# Patient Record
Sex: Female | Born: 1935 | Race: White | Hispanic: No | Smoking: Current every day smoker
Health system: Southern US, Community
[De-identification: ages and names within clinical notes are randomized; demographics above are authoritative.]

## PROBLEM LIST (undated history)

## (undated) DIAGNOSIS — B029 Zoster without complications: Secondary | ICD-10-CM

## (undated) DIAGNOSIS — E039 Hypothyroidism, unspecified: Secondary | ICD-10-CM

## (undated) DIAGNOSIS — C801 Malignant (primary) neoplasm, unspecified: Secondary | ICD-10-CM

## (undated) DIAGNOSIS — F419 Anxiety disorder, unspecified: Secondary | ICD-10-CM

## (undated) DIAGNOSIS — F0390 Unspecified dementia without behavioral disturbance: Secondary | ICD-10-CM

## (undated) HISTORY — PX: APPENDECTOMY: SHX54

## (undated) HISTORY — PX: BREAST SURGERY: SHX581

## (undated) HISTORY — PX: EYE SURGERY: SHX253

---

## 2010-04-14 ENCOUNTER — Ambulatory Visit: Payer: Self-pay | Admitting: Pain Medicine

## 2010-05-08 ENCOUNTER — Ambulatory Visit: Payer: Self-pay | Admitting: Pain Medicine

## 2010-05-12 ENCOUNTER — Ambulatory Visit: Payer: Self-pay | Admitting: Pain Medicine

## 2010-06-12 ENCOUNTER — Ambulatory Visit: Payer: Self-pay | Admitting: Pain Medicine

## 2010-07-09 ENCOUNTER — Ambulatory Visit: Payer: Self-pay | Admitting: Pain Medicine

## 2010-08-06 ENCOUNTER — Ambulatory Visit: Payer: Self-pay | Admitting: Pain Medicine

## 2010-09-03 ENCOUNTER — Ambulatory Visit: Payer: Self-pay | Admitting: Pain Medicine

## 2010-10-08 ENCOUNTER — Ambulatory Visit: Payer: Self-pay | Admitting: Pain Medicine

## 2010-11-12 ENCOUNTER — Ambulatory Visit: Payer: Self-pay | Admitting: Pain Medicine

## 2010-12-10 ENCOUNTER — Ambulatory Visit: Payer: Self-pay | Admitting: Pain Medicine

## 2011-01-07 ENCOUNTER — Ambulatory Visit: Payer: Self-pay | Admitting: Pain Medicine

## 2011-02-04 ENCOUNTER — Ambulatory Visit: Payer: Self-pay | Admitting: Pain Medicine

## 2011-03-04 ENCOUNTER — Ambulatory Visit: Payer: Self-pay | Admitting: Pain Medicine

## 2011-04-08 ENCOUNTER — Ambulatory Visit: Payer: Self-pay | Admitting: Pain Medicine

## 2011-04-15 ENCOUNTER — Ambulatory Visit: Payer: Self-pay | Admitting: Pain Medicine

## 2011-05-06 ENCOUNTER — Ambulatory Visit: Payer: Self-pay | Admitting: Pain Medicine

## 2011-05-13 ENCOUNTER — Ambulatory Visit: Payer: Self-pay | Admitting: Pain Medicine

## 2011-06-03 ENCOUNTER — Ambulatory Visit: Payer: Self-pay | Admitting: Pain Medicine

## 2011-07-08 ENCOUNTER — Ambulatory Visit: Payer: Self-pay | Admitting: Pain Medicine

## 2013-12-12 DIAGNOSIS — F329 Major depressive disorder, single episode, unspecified: Secondary | ICD-10-CM | POA: Insufficient documentation

## 2013-12-12 DIAGNOSIS — J309 Allergic rhinitis, unspecified: Secondary | ICD-10-CM | POA: Insufficient documentation

## 2013-12-12 DIAGNOSIS — F3289 Other specified depressive episodes: Secondary | ICD-10-CM | POA: Insufficient documentation

## 2013-12-12 DIAGNOSIS — E039 Hypothyroidism, unspecified: Secondary | ICD-10-CM | POA: Insufficient documentation

## 2013-12-12 DIAGNOSIS — B0229 Other postherpetic nervous system involvement: Secondary | ICD-10-CM | POA: Insufficient documentation

## 2014-03-21 ENCOUNTER — Ambulatory Visit: Payer: Self-pay | Admitting: Orthopedic Surgery

## 2015-01-03 DIAGNOSIS — H3530 Unspecified macular degeneration: Secondary | ICD-10-CM | POA: Insufficient documentation

## 2015-05-22 ENCOUNTER — Other Ambulatory Visit: Payer: Self-pay | Admitting: Orthopedic Surgery

## 2015-05-22 DIAGNOSIS — S32040A Wedge compression fracture of fourth lumbar vertebra, initial encounter for closed fracture: Secondary | ICD-10-CM

## 2015-05-27 ENCOUNTER — Ambulatory Visit
Admission: RE | Admit: 2015-05-27 | Discharge: 2015-05-27 | Disposition: A | Discharge status: Home / Self Care | Payer: Medicare Other | Source: Ambulatory Visit | Attending: Orthopedic Surgery | Admitting: Orthopedic Surgery

## 2015-05-27 DIAGNOSIS — X58XXXA Exposure to other specified factors, initial encounter: Secondary | ICD-10-CM | POA: Diagnosis not present

## 2015-05-27 DIAGNOSIS — S32040A Wedge compression fracture of fourth lumbar vertebra, initial encounter for closed fracture: Secondary | ICD-10-CM

## 2015-05-27 DIAGNOSIS — M5126 Other intervertebral disc displacement, lumbar region: Secondary | ICD-10-CM | POA: Diagnosis not present

## 2015-05-27 DIAGNOSIS — S32049A Unspecified fracture of fourth lumbar vertebra, initial encounter for closed fracture: Secondary | ICD-10-CM | POA: Insufficient documentation

## 2015-05-27 DIAGNOSIS — M5146 Schmorl's nodes, lumbar region: Secondary | ICD-10-CM | POA: Insufficient documentation

## 2015-06-20 ENCOUNTER — Inpatient Hospital Stay: Admission: RE | Admit: 2015-06-20 | Payer: Medicare Other | Source: Ambulatory Visit

## 2015-06-25 ENCOUNTER — Encounter
Admission: RE | Admit: 2015-06-25 | Discharge: 2015-06-25 | Disposition: A | Discharge status: Home / Self Care | Payer: Medicare Other | Source: Ambulatory Visit | Attending: Orthopedic Surgery | Admitting: Orthopedic Surgery

## 2015-06-25 DIAGNOSIS — Z803 Family history of malignant neoplasm of breast: Secondary | ICD-10-CM | POA: Diagnosis not present

## 2015-06-25 DIAGNOSIS — Z79899 Other long term (current) drug therapy: Secondary | ICD-10-CM | POA: Diagnosis not present

## 2015-06-25 DIAGNOSIS — S32000A Wedge compression fracture of unspecified lumbar vertebra, initial encounter for closed fracture: Secondary | ICD-10-CM | POA: Diagnosis present

## 2015-06-25 DIAGNOSIS — K5792 Diverticulitis of intestine, part unspecified, without perforation or abscess without bleeding: Secondary | ICD-10-CM | POA: Diagnosis not present

## 2015-06-25 DIAGNOSIS — E039 Hypothyroidism, unspecified: Secondary | ICD-10-CM | POA: Diagnosis not present

## 2015-06-25 DIAGNOSIS — F0390 Unspecified dementia without behavioral disturbance: Secondary | ICD-10-CM | POA: Diagnosis not present

## 2015-06-25 DIAGNOSIS — E538 Deficiency of other specified B group vitamins: Secondary | ICD-10-CM | POA: Diagnosis not present

## 2015-06-25 DIAGNOSIS — Y929 Unspecified place or not applicable: Secondary | ICD-10-CM | POA: Diagnosis not present

## 2015-06-25 DIAGNOSIS — F17200 Nicotine dependence, unspecified, uncomplicated: Secondary | ICD-10-CM | POA: Diagnosis not present

## 2015-06-25 DIAGNOSIS — Z7982 Long term (current) use of aspirin: Secondary | ICD-10-CM | POA: Diagnosis not present

## 2015-06-25 DIAGNOSIS — Z86010 Personal history of colonic polyps: Secondary | ICD-10-CM | POA: Diagnosis not present

## 2015-06-25 DIAGNOSIS — Z853 Personal history of malignant neoplasm of breast: Secondary | ICD-10-CM | POA: Diagnosis not present

## 2015-06-25 DIAGNOSIS — Z8669 Personal history of other diseases of the nervous system and sense organs: Secondary | ICD-10-CM | POA: Diagnosis not present

## 2015-06-25 DIAGNOSIS — S32049A Unspecified fracture of fourth lumbar vertebra, initial encounter for closed fracture: Secondary | ICD-10-CM | POA: Diagnosis not present

## 2015-06-25 DIAGNOSIS — E785 Hyperlipidemia, unspecified: Secondary | ICD-10-CM | POA: Diagnosis not present

## 2015-06-25 DIAGNOSIS — F329 Major depressive disorder, single episode, unspecified: Secondary | ICD-10-CM | POA: Diagnosis not present

## 2015-06-25 DIAGNOSIS — L409 Psoriasis, unspecified: Secondary | ICD-10-CM | POA: Diagnosis not present

## 2015-06-25 DIAGNOSIS — Z8249 Family history of ischemic heart disease and other diseases of the circulatory system: Secondary | ICD-10-CM | POA: Diagnosis not present

## 2015-06-25 DIAGNOSIS — X58XXXA Exposure to other specified factors, initial encounter: Secondary | ICD-10-CM | POA: Diagnosis not present

## 2015-06-25 DIAGNOSIS — Y939 Activity, unspecified: Secondary | ICD-10-CM | POA: Diagnosis not present

## 2015-06-25 HISTORY — DX: Anxiety disorder, unspecified: F41.9

## 2015-06-25 HISTORY — DX: Hypothyroidism, unspecified: E03.9

## 2015-06-25 HISTORY — DX: Zoster without complications: B02.9

## 2015-06-25 HISTORY — DX: Unspecified dementia, unspecified severity, without behavioral disturbance, psychotic disturbance, mood disturbance, and anxiety: F03.90

## 2015-06-25 HISTORY — DX: Malignant (primary) neoplasm, unspecified: C80.1

## 2015-06-25 LAB — BASIC METABOLIC PANEL WITH GFR
Anion gap: 4 — ABNORMAL LOW (ref 5–15)
BUN: 10 mg/dL (ref 6–20)
CO2: 28 mmol/L (ref 22–32)
Calcium: 9.2 mg/dL (ref 8.9–10.3)
Chloride: 106 mmol/L (ref 101–111)
Creatinine, Ser: 0.76 mg/dL (ref 0.44–1.00)
GFR calc Af Amer: >60 mL/min (ref >60)
GFR calc non Af Amer: >60 mL/min (ref >60)
Glucose, Bld: 86 mg/dL (ref 65–99)
Potassium: 4.5 mmol/L (ref 3.5–5.1)
Sodium: 138 mmol/L (ref 135–145)

## 2015-06-25 LAB — CBC
HCT: 39 % (ref 35.0–47.0)
Hemoglobin: 12.9 g/dL (ref 12.0–16.0)
MCH: 29.8 pg (ref 26.0–34.0)
MCHC: 33.1 g/dL (ref 32.0–36.0)
MCV: 90 fL (ref 80.0–100.0)
Platelets: 133 K/uL — ABNORMAL LOW (ref 150–440)
RBC: 4.33 MIL/uL (ref 3.80–5.20)
RDW: 16.3 % — ABNORMAL HIGH (ref 11.5–14.5)
WBC: 7.7 K/uL (ref 3.6–11.0)

## 2015-06-25 LAB — SURGICAL PCR SCREEN
MRSA, PCR: NEGATIVE
Staphylococcus aureus: NEGATIVE

## 2015-06-25 NOTE — Patient Instructions (Signed)
  Your procedure is scheduled on: 06/27/15 Thurs Report to Day Surgery.2nd floor medical mall To find out your arrival time please call [phone] between 1PM - 3PM on 06/26/15 Wed.  Remember: Instructions that are not followed completely may result in serious medical risk, up to and including death, or upon the discretion of your surgeon and anesthesiologist your surgery may need to be rescheduled.    _x___ 1. Do not eat food or drink liquids after midnight. No gum chewing or hard candies.     ____ 2. No Alcohol for 24 hours before or after surgery.   ____ 3. Bring all medications with you on the day of surgery if instructed.    __x__ 4. Notify your doctor if there is any change in your medical condition     (cold, fever, infections).     Do not wear jewelry, make-up, hairpins, clips or nail polish.  Do not wear lotions, powders, or perfumes. You may wear deodorant.  Do not shave 48 hours prior to surgery. Men may shave face and neck.  Do not bring valuables to the hospital.    [facility] is not responsible for any belongings or valuables.               Contacts, dentures or bridgework may not be worn into surgery.  Leave your suitcase in the car. After surgery it may be brought to your room.  For patients admitted to the hospital, discharge time is determined by your                treatment team.   Patients discharged the day of surgery will not be allowed to drive home.   Please read over the following fact sheets that you were given:   MRSA Information   ____ Take these medicines the morning of surgery with A SIP OF WATER:    1. celexa  2. aricept   3. neurontin  4. synthroid  5.  6.  ____ Fleet Enema (as directed)   ____ Use CHG Soap as directed  ____ Use inhalers on the day of surgery  ____ Stop metformin 2 days prior to surgery    ____ Take 1/2 of usual insulin dose the night before surgery and none on the morning of surgery.   __x__ Stop  Coumadin/Plavix/aspirin stop aspirin today  _x___ Stop Anti-inflammatories stop mobic and ibuprofen today   ____ Stop supplements until after surgery.    ____ Bring C-Pap to the hospital.

## 2015-06-27 ENCOUNTER — Encounter: Payer: Self-pay | Admitting: *Deleted

## 2015-06-27 ENCOUNTER — Encounter
Admission: RE | Disposition: A | Discharge status: Home / Self Care | Payer: Self-pay | Source: Ambulatory Visit | Attending: Orthopedic Surgery

## 2015-06-27 ENCOUNTER — Ambulatory Visit: Payer: Medicare Other | Admitting: Anesthesiology

## 2015-06-27 ENCOUNTER — Ambulatory Visit: Payer: Medicare Other

## 2015-06-27 ENCOUNTER — Ambulatory Visit
Admission: RE | Admit: 2015-06-27 | Discharge: 2015-06-27 | Disposition: A | Discharge status: Home / Self Care | Payer: Medicare Other | Source: Ambulatory Visit | Attending: Orthopedic Surgery | Admitting: Orthopedic Surgery

## 2015-06-27 DIAGNOSIS — K5792 Diverticulitis of intestine, part unspecified, without perforation or abscess without bleeding: Secondary | ICD-10-CM | POA: Insufficient documentation

## 2015-06-27 DIAGNOSIS — Z79899 Other long term (current) drug therapy: Secondary | ICD-10-CM | POA: Insufficient documentation

## 2015-06-27 DIAGNOSIS — Z8249 Family history of ischemic heart disease and other diseases of the circulatory system: Secondary | ICD-10-CM | POA: Insufficient documentation

## 2015-06-27 DIAGNOSIS — L409 Psoriasis, unspecified: Secondary | ICD-10-CM | POA: Insufficient documentation

## 2015-06-27 DIAGNOSIS — E785 Hyperlipidemia, unspecified: Secondary | ICD-10-CM | POA: Insufficient documentation

## 2015-06-27 DIAGNOSIS — T148XXA Other injury of unspecified body region, initial encounter: Secondary | ICD-10-CM

## 2015-06-27 DIAGNOSIS — F329 Major depressive disorder, single episode, unspecified: Secondary | ICD-10-CM | POA: Insufficient documentation

## 2015-06-27 DIAGNOSIS — Z8669 Personal history of other diseases of the nervous system and sense organs: Secondary | ICD-10-CM | POA: Insufficient documentation

## 2015-06-27 DIAGNOSIS — E538 Deficiency of other specified B group vitamins: Secondary | ICD-10-CM | POA: Insufficient documentation

## 2015-06-27 DIAGNOSIS — Z803 Family history of malignant neoplasm of breast: Secondary | ICD-10-CM | POA: Insufficient documentation

## 2015-06-27 DIAGNOSIS — E039 Hypothyroidism, unspecified: Secondary | ICD-10-CM | POA: Insufficient documentation

## 2015-06-27 DIAGNOSIS — Z7982 Long term (current) use of aspirin: Secondary | ICD-10-CM | POA: Insufficient documentation

## 2015-06-27 DIAGNOSIS — Y939 Activity, unspecified: Secondary | ICD-10-CM | POA: Insufficient documentation

## 2015-06-27 DIAGNOSIS — F17200 Nicotine dependence, unspecified, uncomplicated: Secondary | ICD-10-CM | POA: Insufficient documentation

## 2015-06-27 DIAGNOSIS — S32049A Unspecified fracture of fourth lumbar vertebra, initial encounter for closed fracture: Secondary | ICD-10-CM | POA: Insufficient documentation

## 2015-06-27 DIAGNOSIS — F0390 Unspecified dementia without behavioral disturbance: Secondary | ICD-10-CM | POA: Insufficient documentation

## 2015-06-27 DIAGNOSIS — Z853 Personal history of malignant neoplasm of breast: Secondary | ICD-10-CM | POA: Insufficient documentation

## 2015-06-27 DIAGNOSIS — X58XXXA Exposure to other specified factors, initial encounter: Secondary | ICD-10-CM | POA: Insufficient documentation

## 2015-06-27 DIAGNOSIS — Y929 Unspecified place or not applicable: Secondary | ICD-10-CM | POA: Insufficient documentation

## 2015-06-27 DIAGNOSIS — Z86010 Personal history of colonic polyps: Secondary | ICD-10-CM | POA: Insufficient documentation

## 2015-06-27 HISTORY — PX: KYPHOPLASTY: SHX5884

## 2015-06-27 SURGERY — KYPHOPLASTY
Anesthesia: General | Wound class: Clean

## 2015-06-27 MED ORDER — BUPIVACAINE-EPINEPHRINE (PF) 0.5% -1:200000 IJ SOLN
INTRAMUSCULAR | Status: AC
  Filled 2015-06-27: qty 30

## 2015-06-27 MED ORDER — HYDROCODONE-ACETAMINOPHEN 5-325 MG PO TABS: 1.0000 | ORAL_TABLET | Freq: Four times a day (QID) | ORAL | Status: DC | PRN

## 2015-06-27 MED ORDER — FAMOTIDINE 20 MG PO TABS
20.0000 mg | ORAL_TABLET | Freq: Once | ORAL | Status: AC
  Administered 2015-06-27: 20 mg via ORAL

## 2015-06-27 MED ORDER — IOHEXOL 240 MG/ML SOLN
INTRAMUSCULAR | Status: AC
  Filled 2015-06-27: qty 100

## 2015-06-27 MED ORDER — IOHEXOL 240 MG/ML SOLN
INTRAMUSCULAR | Status: AC
  Filled 2015-06-27: qty 200

## 2015-06-27 MED ORDER — FAMOTIDINE 20 MG PO TABS
ORAL_TABLET | ORAL | Status: AC
  Administered 2015-06-27: 20 mg via ORAL
  Filled 2015-06-27: qty 1

## 2015-06-27 MED ORDER — MIDAZOLAM HCL 2 MG/2ML IJ SOLN
INTRAMUSCULAR | Status: DC | PRN
  Administered 2015-06-27 (×2): 1 mg via INTRAVENOUS

## 2015-06-27 MED ORDER — BUPIVACAINE-EPINEPHRINE (PF) 0.5% -1:200000 IJ SOLN
INTRAMUSCULAR | Status: DC | PRN
  Administered 2015-06-27: 10 mL via PERINEURAL

## 2015-06-27 MED ORDER — KETAMINE HCL 10 MG/ML IJ SOLN
INTRAMUSCULAR | Status: DC | PRN
  Administered 2015-06-27: 10 mg via INTRAVENOUS
  Administered 2015-06-27: 15 mg via INTRAVENOUS
  Administered 2015-06-27: 25 mg via INTRAVENOUS

## 2015-06-27 MED ORDER — LACTATED RINGERS IV SOLN
INTRAVENOUS | Status: DC
  Administered 2015-06-27 (×2): via INTRAVENOUS

## 2015-06-27 MED ORDER — HYDROCODONE-ACETAMINOPHEN 5-325 MG PO TABS: 1.0000 | ORAL_TABLET | ORAL | Status: DC | PRN

## 2015-06-27 MED ORDER — LIDOCAINE HCL 1 % IJ SOLN
INTRAMUSCULAR | Status: DC | PRN
  Administered 2015-06-27: 10 mL

## 2015-06-27 MED ORDER — METOCLOPRAMIDE HCL 5 MG/ML IJ SOLN: 5.0000 mg | Freq: Three times a day (TID) | INTRAMUSCULAR | Status: DC | PRN

## 2015-06-27 MED ORDER — LIDOCAINE HCL (PF) 1 % IJ SOLN
INTRAMUSCULAR | Status: AC
  Filled 2015-06-27: qty 30

## 2015-06-27 MED ORDER — ONDANSETRON HCL 4 MG PO TABS: 4.0000 mg | ORAL_TABLET | Freq: Four times a day (QID) | ORAL | Status: DC | PRN

## 2015-06-27 MED ORDER — CEFAZOLIN SODIUM-DEXTROSE 2-3 GM-% IV SOLR: 2.0000 g | Freq: Once | INTRAVENOUS | Status: DC

## 2015-06-27 MED ORDER — METOCLOPRAMIDE HCL 10 MG PO TABS: 5.0000 mg | ORAL_TABLET | Freq: Three times a day (TID) | ORAL | Status: DC | PRN

## 2015-06-27 MED ORDER — SODIUM CHLORIDE 0.9 % IV SOLN: INTRAVENOUS | Status: DC

## 2015-06-27 MED ORDER — PROPOFOL 500 MG/50ML IV EMUL
INTRAVENOUS | Status: DC | PRN
  Administered 2015-06-27: 25 ug/kg/min via INTRAVENOUS

## 2015-06-27 MED ORDER — CEFAZOLIN SODIUM-DEXTROSE 2-3 GM-% IV SOLR
INTRAVENOUS | Status: AC
  Filled 2015-06-27: qty 50

## 2015-06-27 MED ORDER — ONDANSETRON HCL 4 MG/2ML IJ SOLN: 4.0000 mg | Freq: Four times a day (QID) | INTRAMUSCULAR | Status: DC | PRN

## 2015-06-27 SURGICAL SUPPLY — 13 items
CEMENT KYPHON CX01A KIT/MIXER (Cement) ×3 IMPLANT
DEVICE BIOPSY BONE KYPHX (INSTRUMENTS) ×3 IMPLANT
DRAPE C-ARM XRAY 36X54 (DRAPES) ×3 IMPLANT
DURAPREP 26ML APPLICATOR (WOUND CARE) ×3 IMPLANT
GLOVE SURG ORTHO 9.0 STRL STRW (GLOVE) ×3 IMPLANT
GOWN SPECIALTY ULTRA XL (MISCELLANEOUS) ×3 IMPLANT
GOWN STRL REUS W/ TWL LRG LVL3 (GOWN DISPOSABLE) ×1 IMPLANT
GOWN STRL REUS W/TWL LRG LVL3 (GOWN DISPOSABLE) ×2
LIQUID BAND (GAUZE/BANDAGES/DRESSINGS) ×3 IMPLANT
PACK KYPHOPLASTY (MISCELLANEOUS) ×3 IMPLANT
STRAP SAFETY BODY (MISCELLANEOUS) ×3 IMPLANT
TRAY KYPHOPAK 15/3 EXPRESS 1ST (MISCELLANEOUS) ×3 IMPLANT
TRAY KYPHOPAK 20/3 EXPRESS 1ST (MISCELLANEOUS) ×3 IMPLANT

## 2015-06-27 NOTE — Discharge Instructions (Addendum)
Remove band aid Saturday Ok to shower, starting SaturdayAMBULATORY SURGERY  DISCHARGE INSTRUCTIONS   1) The drugs that you were given will stay in your system until tomorrow so for the next 24 hours you should not:  A) Drive an automobile B) Make any legal decisions C) Drink any alcoholic beverage   2) You may resume regular meals tomorrow.  Today it is better to start with liquids and gradually work up to solid foods.  You may eat anything you prefer, but it is better to start with liquids, then soup and crackers, and gradually work up to solid foods.   3) Please notify your doctor immediately if you have any unusual bleeding, trouble breathing, redness and pain at the surgery site, drainage, fever, or pain not relieved by medication.    4) Additional Instructions:        Please contact your physician with any problems or Same Day Surgery at [phone], Monday through Friday 6 am to 4 pm, or [place] at [facility] number at [phone].AMBULATORY SURGERY  DISCHARGE INSTRUCTIONS   5) The drugs that you were given will stay in your system until tomorrow so for the next 24 hours you should not:  D) Drive an automobile E) Make any legal decisions F) Drink any alcoholic beverage   6) You may resume regular meals tomorrow.  Today it is better to start with liquids and gradually work up to solid foods.  You may eat anything you prefer, but it is better to start with liquids, then soup and crackers, and gradually work up to solid foods.   7) Please notify your doctor immediately if you have any unusual bleeding, trouble breathing, redness and pain at the surgery site, drainage, fever, or pain not relieved by medication.    8) Additional Instructions:        Please contact your physician with any problems or Same Day Surgery at [phone], Monday through Friday 6 am to 4 pm, or [place] at [facility] number at [phone].

## 2015-06-27 NOTE — Anesthesia Preprocedure Evaluation (Addendum)
Anesthesia Evaluation  Patient identified by MRN, date of birth, ID band Patient awake    Reviewed: Allergy & Precautions, H&P , NPO status , Patient's Chart, lab work & pertinent test results, reviewed documented beta blocker date and time   History of Anesthesia Complications Negative for: history of anesthetic complications  Airway Mallampati: II  TM Distance: >3 FB Neck ROM: full    Dental no notable dental hx. (+) Upper Dentures, Edentulous Upper, Partial Lower, Poor Dentition   Pulmonary neg pulmonary ROS, Current Smoker,    Pulmonary exam normal breath sounds clear to auscultation       Cardiovascular Exercise Tolerance: Good negative cardio ROS Normal cardiovascular exam Rhythm:regular Rate:Normal     Neuro/Psych PSYCHIATRIC DISORDERS (Dementia and anxiety) negative neurological ROS  negative psych ROS   GI/Hepatic negative GI ROS, Neg liver ROS,   Endo/Other  neg diabetesHypothyroidism   Renal/GU negative Renal ROS  negative genitourinary   Musculoskeletal   Abdominal   Peds  Hematology negative hematology ROS (+)   Anesthesia Other Findings Past Medical History:   Shingles                                                     Cancer (HCC)                                                   Comment:breast   Hypothyroidism                                               Anxiety                                                      Dementia                                                     Reproductive/Obstetrics negative OB ROS                            Anesthesia Physical Anesthesia Plan  ASA: II  Anesthesia Plan: General   Post-op Pain Management:    Induction:   Airway Management Planned:   Additional Equipment:   Intra-op Plan:   Post-operative Plan:   Informed Consent: I have reviewed the patients History and Physical, chart, labs and discussed the procedure  including the risks, benefits and alternatives for the proposed anesthesia with the patient or authorized representative who has indicated his/her understanding and acceptance.   Dental Advisory Given  Plan Discussed with: Anesthesiologist, CRNA and Surgeon  Anesthesia Plan Comments:        Anesthesia Quick Evaluation

## 2015-06-27 NOTE — Anesthesia Procedure Notes (Signed)
Date/Time: 06/27/2015 3:07 PM Performed by: Nelda Marseille Pre-anesthesia Checklist: Patient identified, Emergency Drugs available, Suction available, Patient being monitored and Timeout performed Oxygen Delivery Method: Nasal cannula

## 2015-06-27 NOTE — Transfer of Care (Signed)
Immediate Anesthesia Transfer of Care Note  Patient: Tina [name]  Procedure(s) Performed: Procedure(s): KYPHOPLASTY L4 (N/A)  Patient Location: PACU  Anesthesia Type:General  Level of Consciousness: sedated  Airway & Oxygen Therapy: Patient Spontanous Breathing and Patient connected to nasal cannula oxygen  Post-op Assessment: Report given to RN and Post -op Vital signs reviewed and stable  Post vital signs: Reviewed and stable  Last Vitals:  Filed Vitals:   06/27/15 1204  BP: 130/70  Pulse: 76  Temp: 36.6 C  Resp: 20    Complications: No apparent anesthesia complications

## 2015-06-27 NOTE — H&P (Signed)
Reviewed paper H+P, will be scanned into chart. No changes noted.  

## 2015-06-27 NOTE — Op Note (Signed)
06/27/2015  3:31 PM  PATIENT:  Tina Tina [name]  81 y.o. female  PRE-OPERATIVE DIAGNOSIS:  L4 LUMBAR COMPRESSION FRACTURE  POST-OPERATIVE DIAGNOSIS:  L4 LUMBAR COMPRESSION FRACTURE  PROCEDURE:  Procedure(s): KYPHOPLASTY L4 (N/A)  SURGEON: Laurene Footman, MD  ASSISTANTS: None  ANESTHESIA:   local and MAC  EBL:  Total I/O In: 600 [I.V.:600] Out: 10 [Blood:10]  BLOOD ADMINISTERED:none  DRAINS: none   LOCAL MEDICATIONS USED:  MARCAINE    and XYLOCAINE   SPECIMEN:  Source of Specimen:  L4 vertebral body  DISPOSITION OF SPECIMEN:  PATHOLOGY  COUNTS:  YES  TOURNIQUET:  * No tourniquets in log *  IMPLANTS: Bone cement  DICTATION: .Dragon Dictation patient brought the operating room and after adequate sedation was given, the patient was placed prone. C-arm was brought in and good visualization of the L4 compression fractures obtained. After patient identification and timeout procedures were completed, 1% Xylocaine was infiltrated on the right and left side at L4. After infiltrating the subcutaneous skin, the back was then prepped and draped in sterile fashion and repeat timeout procedure carried out. Spinal needle was used to get down to the pedicle on both sides and a combination of 10 cc 1% Xylocaine 10 cc half percent Sensorcaine with epinephrine was infiltrated. Small incision was made on the right and a trocar advanced to a extrapedicular approach into the vertebral body. Biopsy was obtained. Drilling was carried out and a balloon inflated as went to the midline and so single stick was decided to be adequate. After inflating proximally 3 have cc the balloon was deflated and when the cement was the appropriate consistency, cement was used to Tina [name] the vertebral body. There was minimal extravasation into the disc space between L3 and 4 but good Tina [name] across the vertebral body in the area of the fracture. After adequate cement Tina [name] on both sides between the 2 pedicles trocar was removed  and permanent C-arm views obtained. Dermabond was used to close the skin incision, when it was dry Band-Aids applied  PLAN OF CARE: Discharge to home after PACU  PATIENT DISPOSITION:  PACU - hemodynamically stable.

## 2015-06-28 ENCOUNTER — Encounter: Payer: Self-pay | Admitting: Orthopedic Surgery

## 2015-06-29 NOTE — Anesthesia Postprocedure Evaluation (Signed)
Anesthesia Post Note  Patient: Tina [name]  Procedure(s) Performed: Procedure(s) (LRB): KYPHOPLASTY L4 (N/A)  Patient location during evaluation: PACU Anesthesia Type: General Level of consciousness: awake and alert Pain management: pain level controlled Vital Signs Assessment: post-procedure vital signs reviewed and stable Respiratory status: spontaneous breathing, nonlabored ventilation, respiratory function stable and patient connected to nasal cannula oxygen Cardiovascular status: blood pressure returned to baseline and stable Postop Assessment: no signs of nausea or vomiting Anesthetic complications: no    Last Vitals:  Filed Vitals:   06/27/15 1600 06/27/15 1615  BP: 150/74 151/74  Pulse: 84 84  Temp:  36.2 C  Resp: 18 18    Last Pain:  Filed Vitals:   06/28/15 0833  PainSc: 0-No pain                 Martha Clan

## 2015-07-02 LAB — SURGICAL PATHOLOGY

## 2016-01-07 ENCOUNTER — Other Ambulatory Visit: Payer: Self-pay | Admitting: Orthopedic Surgery

## 2016-01-07 DIAGNOSIS — M1611 Unilateral primary osteoarthritis, right hip: Secondary | ICD-10-CM

## 2016-01-08 ENCOUNTER — Other Ambulatory Visit: Payer: Self-pay | Admitting: Orthopedic Surgery

## 2016-01-09 ENCOUNTER — Ambulatory Visit
Admission: RE | Admit: 2016-01-09 | Discharge: 2016-01-09 | Disposition: A | Discharge status: Home / Self Care | Payer: Medicare Other | Source: Ambulatory Visit | Attending: Orthopedic Surgery | Admitting: Orthopedic Surgery

## 2016-01-09 DIAGNOSIS — M1611 Unilateral primary osteoarthritis, right hip: Secondary | ICD-10-CM | POA: Diagnosis not present

## 2016-01-09 MED ORDER — METHYLPREDNISOLONE ACETATE 40 MG/ML IJ SUSP
40.0000 mg | Freq: Once | INTRAMUSCULAR | Status: AC
  Administered 2016-01-09: 40 mg via INTRA_ARTICULAR
  Filled 2016-01-09: qty 1

## 2016-01-09 MED ORDER — BUPIVACAINE HCL (PF) 0.25 % IJ SOLN
7.0000 mL | Freq: Once | INTRAMUSCULAR | Status: AC
  Administered 2016-01-09: 7 mL
  Filled 2016-01-09: qty 10

## 2016-01-09 MED ORDER — LIDOCAINE HCL 1 % IJ SOLN
5.0000 mL | Freq: Once | INTRAMUSCULAR | Status: AC
  Administered 2016-01-09: 5 mL
  Filled 2016-01-09: qty 20

## 2016-01-09 MED ORDER — IOPAMIDOL (ISOVUE-300) INJECTION 61%
10.0000 mL | Freq: Once | INTRAVENOUS | Status: AC | PRN
  Administered 2016-01-09: 10 mL

## 2016-01-09 NOTE — Discharge Instructions (Signed)
Hip injection discharge instructions reviewed with pt and pt signed paper copy for chart

## 2016-11-02 ENCOUNTER — Emergency Department: Payer: Medicare Other

## 2016-11-02 ENCOUNTER — Encounter: Payer: Self-pay | Admitting: Emergency Medicine

## 2016-11-02 ENCOUNTER — Emergency Department
Admission: EM | Admit: 2016-11-02 | Discharge: 2016-11-02 | Disposition: A | Discharge status: Home / Self Care | Payer: Medicare Other | Attending: Emergency Medicine | Admitting: Emergency Medicine

## 2016-11-02 DIAGNOSIS — S4991XA Unspecified injury of right shoulder and upper arm, initial encounter: Secondary | ICD-10-CM | POA: Diagnosis present

## 2016-11-02 DIAGNOSIS — S0512XA Contusion of eyeball and orbital tissues, left eye, initial encounter: Secondary | ICD-10-CM | POA: Diagnosis not present

## 2016-11-02 DIAGNOSIS — E039 Hypothyroidism, unspecified: Secondary | ICD-10-CM | POA: Insufficient documentation

## 2016-11-02 DIAGNOSIS — Z79899 Other long term (current) drug therapy: Secondary | ICD-10-CM | POA: Insufficient documentation

## 2016-11-02 DIAGNOSIS — S161XXA Strain of muscle, fascia and tendon at neck level, initial encounter: Secondary | ICD-10-CM

## 2016-11-02 DIAGNOSIS — F17210 Nicotine dependence, cigarettes, uncomplicated: Secondary | ICD-10-CM | POA: Insufficient documentation

## 2016-11-02 DIAGNOSIS — Y929 Unspecified place or not applicable: Secondary | ICD-10-CM | POA: Diagnosis not present

## 2016-11-02 DIAGNOSIS — W06XXXA Fall from bed, initial encounter: Secondary | ICD-10-CM | POA: Insufficient documentation

## 2016-11-02 DIAGNOSIS — Z853 Personal history of malignant neoplasm of breast: Secondary | ICD-10-CM | POA: Diagnosis not present

## 2016-11-02 DIAGNOSIS — S42001A Fracture of unspecified part of right clavicle, initial encounter for closed fracture: Secondary | ICD-10-CM

## 2016-11-02 DIAGNOSIS — S42031A Displaced fracture of lateral end of right clavicle, initial encounter for closed fracture: Secondary | ICD-10-CM | POA: Diagnosis not present

## 2016-11-02 DIAGNOSIS — S0083XA Contusion of other part of head, initial encounter: Secondary | ICD-10-CM

## 2016-11-02 DIAGNOSIS — Z7982 Long term (current) use of aspirin: Secondary | ICD-10-CM | POA: Insufficient documentation

## 2016-11-02 DIAGNOSIS — Y939 Activity, unspecified: Secondary | ICD-10-CM | POA: Insufficient documentation

## 2016-11-02 DIAGNOSIS — Y999 Unspecified external cause status: Secondary | ICD-10-CM | POA: Diagnosis not present

## 2016-11-02 MED ORDER — TRAMADOL HCL 50 MG PO TABS: 50.0000 mg | ORAL_TABLET | Freq: Two times a day (BID) | ORAL | 0 refills | Status: DC | PRN

## 2016-11-02 MED ORDER — ACETAMINOPHEN 325 MG PO TABS
650.0000 mg | ORAL_TABLET | Freq: Once | ORAL | Status: AC
  Administered 2016-11-02: 650 mg via ORAL
  Filled 2016-11-02: qty 2

## 2016-11-02 NOTE — ED Triage Notes (Signed)
Rolled over and fell off bed about 3am landing on right side. Has left eye swelling, pain in right side neck and right shoulder.no loc

## 2016-11-02 NOTE — Discharge Instructions (Signed)
Wear arm sling and take extra strength Tylenol until evaluation by orthopedic doctor. Call today to schedule appointment.

## 2016-11-02 NOTE — ED Provider Notes (Signed)
[facility] Emergency Department Provider Note   ____________________________________________   First MD Initiated Contact with Patient 11/02/16 1056     (approximate)  I have reviewed the triage vital signs and the nursing notes.   HISTORY  Chief Complaint Fall    HPI Tina [name] is a 83 y.o. female patient complain of neck pain, facial pain, right clavicle, and right shoulder pain secondary to falling out of a bed this morning. Patient denies loss of consciousness. Patient denies vision disturbance or vertigo.Patient rates the pain as a 4/10. Describes pain as "achy". Patient took extra strength Tylenol approximately 10 minutes after the fall.   Past Medical History:  Diagnosis Date  . Anxiety   . Cancer ([place])    breast  . Dementia   . Hypothyroidism   . Shingles     There are no active problems to display for this patient.   Past Surgical History:  Procedure Laterality Date  . APPENDECTOMY    . BREAST SURGERY    . EYE SURGERY    . KYPHOPLASTY N/A 06/27/2015   Procedure: KYPHOPLASTY L4;  Surgeon: Hessie Knows, MD;  Location: [facility] ORS;  Service: Orthopedics;  Laterality: N/A;    Prior to Admission medications   Medication Sig Start Date End Date Taking? Authorizing Provider  acetaminophen (TYLENOL) 325 MG tablet Take 650 mg by mouth every 6 (six) hours as needed.    [provider]  aspirin 81 MG tablet Take 81 mg by mouth daily.    [provider]  citalopram (CELEXA) 10 MG tablet Take 10 mg by mouth daily.    [provider]  donepezil (ARICEPT) 10 MG tablet Take 10 mg by mouth every morning.    [provider]  fluticasone (FLONASE) 50 MCG/ACT nasal spray Place 2 sprays into both nostrils daily.    [provider]  gabapentin (NEURONTIN) 300 MG capsule Take 300 mg by mouth 3 (three) times daily.    [provider]  HYDROcodone-acetaminophen (NORCO) 5-325 MG tablet Take 1 tablet  by mouth every 6 (six) hours as needed for moderate pain. 06/27/15   Hessie Knows, MD  HYDROcodone-acetaminophen (NORCO/VICODIN) 5-325 MG tablet Take 1 tablet by mouth every 6 (six) hours as needed for moderate pain.    [provider]  ibuprofen (ADVIL,MOTRIN) 200 MG tablet Take 200 mg by mouth every 6 (six) hours as needed.    [provider]  levothyroxine (SYNTHROID, LEVOTHROID) 75 MCG tablet Take 75 mcg by mouth daily before breakfast.    [provider]  meloxicam (MOBIC) 15 MG tablet Take 15 mg by mouth daily.    [provider]  Multiple Vitamin (MULTIVITAMIN) tablet Take 1 tablet by mouth daily.    [provider]    Allergies Patient has no known allergies.  No family history on file.  Social History Social History  Substance Use Topics  . Smoking status: Current Every Day Smoker    Packs/day: 0.50  . Smokeless tobacco: Never Used  . Alcohol use No    Review of Systems  Constitutional: No fever/chills Eyes: No visual changes. ENT: No sore throat. Cardiovascular: Denies chest pain. Respiratory: Denies shortness of breath. Gastrointestinal: No abdominal pain.  No nausea, no vomiting.  No diarrhea.  No constipation. Genitourinary: Negative for dysuria. Musculoskeletal: Negative for back pain. Skin: Negative for rash. Neurological: Negative for headaches, focal weakness or numbness. Psychiatric:Anxiety and dementia Endocrine:Hypothyroidism ____________________________________________   PHYSICAL EXAM:  VITAL SIGNS: ED  Triage Vitals  Enc Vitals Group     BP 11/02/16 0957 (!) 108/54     Pulse Rate 11/02/16 0957 86     Resp 11/02/16 0957 14     Temp 11/02/16 0957 98.1 F (36.7 C)     Temp Source 11/02/16 0957 Oral     SpO2 11/02/16 0957 95 %     Weight 11/02/16 0958 155 lb (70.3 kg)     Height 11/02/16 0958 5\' 3"  (1.6 m)     Head Circumference --      Peak Flow --      Pain Score 11/02/16 0957 4     Pain Loc --       Pain Edu? --      Excl. in [place]? --     Constitutional: Alert and oriented. Well appearing and in no acute distress. Eyes: Conjunctivae are normal. PERRL. EOMI. Head: Atraumatic. Nose: No congestion/rhinnorhea. Mouth/Throat: Mucous membranes are moist.  Oropharynx non-erythematous. Neck: No stridor.  No cervical spine tenderness to palpation. Decreased left lateral movements. Hematological/Lymphatic/Immunilogical: No cervical lymphadenopathy. Cardiovascular: Normal rate, regular rhythm. Grossly normal heart sounds.  Good peripheral circulation. Respiratory: Normal respiratory effort.  No retractions. Lungs CTAB. Gastrointestinal: Soft and nontender. No distention. No abdominal bruits. No CVA tenderness. Musculoskeletal: No obvious right shoulder deformity. Patient has some moderate guarding palpation of distal right clavicle. Patient decreased range of motion with abduction of the right shoulder.  Neurologic:  Normal speech and language. No gross focal neurologic deficits are appreciated. No gait instability. Skin:  Skin is warm, dry and intact. No rash noted. Edema and ecchymosis left inferior orbital area. Psychiatric: Mood and affect are normal. Speech and behavior are normal.  ____________________________________________   LABS (all labs ordered are listed, but only abnormal results are displayed)  Labs Reviewed - No data to display ____________________________________________  EKG   ____________________________________________  RADIOLOGY  No acute findings of facial CT. No acute findings x-ray of the cervical spine. X-ray of the right shoulder shows a distal right clavicle fracture. ____________________________________________   PROCEDURES  Procedure(s) performed: None  Procedures  Critical Care performed: No  ____________________________________________   INITIAL IMPRESSION / ASSESSMENT AND PLAN / ED COURSE  Pertinent labs & imaging results that were  available during my care of the patient were reviewed by me and considered in my medical decision making (see chart for details).  Facial contusion, cervical strain, and right distal clavicle fracture secondary to a fall. Discuss CT and x-ray findings with patient and daughter. Patient given discharge care instructions. Patient placed in an arm sling and advised to follow orthopedics. Continues take extra Tylenol for pain. Return by ER for condition worsens before seeing orthopedics.      ____________________________________________   FINAL CLINICAL IMPRESSION(S) / ED DIAGNOSES  Final diagnoses:  Contusion of face, initial encounter  Strain of neck muscle, initial encounter  Closed nondisplaced fracture of right clavicle, unspecified part of clavicle, initial encounter      NEW MEDICATIONS STARTED DURING THIS VISIT:  New Prescriptions   No medications on file     Note:  This document was prepared using Dragon voice recognition software and may include unintentional dictation errors.    Sable Feil, PA-C 11/02/16 1215    Earleen Newport, MD 11/02/16 [phone]

## 2016-11-04 ENCOUNTER — Other Ambulatory Visit: Payer: Self-pay | Admitting: Orthopedic Surgery

## 2017-05-15 ENCOUNTER — Inpatient Hospital Stay: Payer: Medicare Other

## 2017-05-15 ENCOUNTER — Emergency Department: Payer: Medicare Other

## 2017-05-15 ENCOUNTER — Other Ambulatory Visit: Payer: Self-pay

## 2017-05-15 ENCOUNTER — Inpatient Hospital Stay
Admission: EM | Admit: 2017-05-15 | Discharge: 2017-05-18 | DRG: 377 | Disposition: A | Discharge status: Home Health | Payer: Medicare Other | Attending: Internal Medicine | Admitting: Internal Medicine

## 2017-05-15 DIAGNOSIS — R4182 Altered mental status, unspecified: Secondary | ICD-10-CM

## 2017-05-15 DIAGNOSIS — Z791 Long term (current) use of non-steroidal anti-inflammatories (NSAID): Secondary | ICD-10-CM | POA: Diagnosis not present

## 2017-05-15 DIAGNOSIS — F17210 Nicotine dependence, cigarettes, uncomplicated: Secondary | ICD-10-CM | POA: Diagnosis present

## 2017-05-15 DIAGNOSIS — Z8619 Personal history of other infectious and parasitic diseases: Secondary | ICD-10-CM

## 2017-05-15 DIAGNOSIS — F0390 Unspecified dementia without behavioral disturbance: Secondary | ICD-10-CM | POA: Diagnosis present

## 2017-05-15 DIAGNOSIS — F419 Anxiety disorder, unspecified: Secondary | ICD-10-CM | POA: Diagnosis present

## 2017-05-15 DIAGNOSIS — Z7983 Long term (current) use of bisphosphonates: Secondary | ICD-10-CM | POA: Diagnosis not present

## 2017-05-15 DIAGNOSIS — Z853 Personal history of malignant neoplasm of breast: Secondary | ICD-10-CM | POA: Diagnosis not present

## 2017-05-15 DIAGNOSIS — Z7982 Long term (current) use of aspirin: Secondary | ICD-10-CM | POA: Diagnosis not present

## 2017-05-15 DIAGNOSIS — Z79899 Other long term (current) drug therapy: Secondary | ICD-10-CM

## 2017-05-15 DIAGNOSIS — K209 Esophagitis, unspecified: Secondary | ICD-10-CM | POA: Diagnosis present

## 2017-05-15 DIAGNOSIS — T39395A Adverse effect of other nonsteroidal anti-inflammatory drugs [NSAID], initial encounter: Secondary | ICD-10-CM | POA: Diagnosis present

## 2017-05-15 DIAGNOSIS — E039 Hypothyroidism, unspecified: Secondary | ICD-10-CM | POA: Diagnosis present

## 2017-05-15 DIAGNOSIS — M25559 Pain in unspecified hip: Secondary | ICD-10-CM

## 2017-05-15 DIAGNOSIS — D72829 Elevated white blood cell count, unspecified: Secondary | ICD-10-CM | POA: Diagnosis present

## 2017-05-15 DIAGNOSIS — K274 Chronic or unspecified peptic ulcer, site unspecified, with hemorrhage: Principal | ICD-10-CM | POA: Diagnosis present

## 2017-05-15 DIAGNOSIS — R410 Disorientation, unspecified: Secondary | ICD-10-CM | POA: Diagnosis not present

## 2017-05-15 DIAGNOSIS — K921 Melena: Secondary | ICD-10-CM

## 2017-05-15 DIAGNOSIS — R571 Hypovolemic shock: Secondary | ICD-10-CM | POA: Diagnosis present

## 2017-05-15 DIAGNOSIS — R402413 Glasgow coma scale score 13-15, at hospital admission: Secondary | ICD-10-CM | POA: Diagnosis present

## 2017-05-15 DIAGNOSIS — G9340 Encephalopathy, unspecified: Secondary | ICD-10-CM | POA: Diagnosis present

## 2017-05-15 DIAGNOSIS — Z9049 Acquired absence of other specified parts of digestive tract: Secondary | ICD-10-CM

## 2017-05-15 DIAGNOSIS — D62 Acute posthemorrhagic anemia: Secondary | ICD-10-CM | POA: Diagnosis present

## 2017-05-15 DIAGNOSIS — K922 Gastrointestinal hemorrhage, unspecified: Secondary | ICD-10-CM

## 2017-05-15 DIAGNOSIS — R000 Tachycardia, unspecified: Secondary | ICD-10-CM

## 2017-05-15 LAB — URINALYSIS, COMPLETE (UACMP) WITH MICROSCOPIC
BACTERIA UA: NONE SEEN
Bilirubin Urine: NEGATIVE
Glucose, UA: NEGATIVE mg/dL
Ketones, ur: NEGATIVE mg/dL
LEUKOCYTES UA: NEGATIVE
NITRITE: NEGATIVE
PH: 5 (ref 5.0–8.0)
Protein, ur: NEGATIVE mg/dL
SPECIFIC GRAVITY, URINE: 1.019 (ref 1.005–1.030)
SQUAMOUS EPITHELIAL / LPF: NONE SEEN

## 2017-05-15 LAB — COMPREHENSIVE METABOLIC PANEL
ALT: 24 U/L (ref 14–54)
ANION GAP: 7 (ref 5–15)
AST: 39 U/L (ref 15–41)
Albumin: 3.2 g/dL — ABNORMAL LOW (ref 3.5–5.0)
Alkaline Phosphatase: 203 U/L — ABNORMAL HIGH (ref 38–126)
BUN: 53 mg/dL — ABNORMAL HIGH (ref 6–20)
CHLORIDE: 111 mmol/L (ref 101–111)
CO2: 25 mmol/L (ref 22–32)
Calcium: 9 mg/dL (ref 8.9–10.3)
Creatinine, Ser: 0.82 mg/dL (ref 0.44–1.00)
Glucose, Bld: 127 mg/dL — ABNORMAL HIGH (ref 65–99)
Potassium: 4.5 mmol/L (ref 3.5–5.1)
SODIUM: 143 mmol/L (ref 135–145)
Total Bilirubin: 1 mg/dL (ref 0.3–1.2)
Total Protein: 6.6 g/dL (ref 6.5–8.1)

## 2017-05-15 LAB — CBC
HCT: 38.4 % (ref 35.0–47.0)
HEMATOCRIT: 34.4 % — AB (ref 35.0–47.0)
HEMOGLOBIN: 12.3 g/dL (ref 12.0–16.0)
Hemoglobin: 11.2 g/dL — ABNORMAL LOW (ref 12.0–16.0)
MCH: 29.1 pg (ref 26.0–34.0)
MCH: 29.2 pg (ref 26.0–34.0)
MCHC: 31.9 g/dL — ABNORMAL LOW (ref 32.0–36.0)
MCHC: 32.5 g/dL (ref 32.0–36.0)
MCV: 91 fL (ref 80.0–100.0)
MCV: 89.6 fL (ref 80.0–100.0)
PLATELETS: 209 10*3/uL (ref 150–440)
PLATELETS: 185 10*3/uL (ref 150–440)
RBC: 4.22 MIL/uL (ref 3.80–5.20)
RBC: 3.84 MIL/uL (ref 3.80–5.20)
RDW: 16.1 % — ABNORMAL HIGH (ref 11.5–14.5)
RDW: 15.5 % — ABNORMAL HIGH (ref 11.5–14.5)
WBC: 13.8 10*3/uL — AB (ref 3.6–11.0)
WBC: 16.7 10*3/uL — AB (ref 3.6–11.0)

## 2017-05-15 LAB — TROPONIN I

## 2017-05-15 LAB — PROTIME-INR
INR: 1.14
Prothrombin Time: 14.5 seconds (ref 11.4–15.2)

## 2017-05-15 LAB — AMMONIA: AMMONIA: 13 umol/L (ref 9–35)

## 2017-05-15 LAB — TSH: TSH: 0.621 u[IU]/mL (ref 0.350–4.500)

## 2017-05-15 LAB — LIPASE, BLOOD: LIPASE: 37 U/L (ref 11–51)

## 2017-05-15 MED ORDER — LORAZEPAM 2 MG/ML IJ SOLN
0.5000 mg | INTRAMUSCULAR | Status: DC | PRN
  Administered 2017-05-15 (×2): 0.5 mg via INTRAVENOUS
  Filled 2017-05-15 (×3): qty 1

## 2017-05-15 MED ORDER — ONDANSETRON HCL 4 MG PO TABS: 4.0000 mg | ORAL_TABLET | Freq: Four times a day (QID) | ORAL | Status: DC | PRN

## 2017-05-15 MED ORDER — BISACODYL 10 MG RE SUPP: 10.0000 mg | Freq: Every day | RECTAL | Status: DC | PRN

## 2017-05-15 MED ORDER — CITALOPRAM HYDROBROMIDE 20 MG PO TABS
10.0000 mg | ORAL_TABLET | Freq: Every day | ORAL | Status: DC
  Administered 2017-05-17 – 2017-05-18 (×2): 10 mg via ORAL
  Filled 2017-05-15 (×2): qty 1

## 2017-05-15 MED ORDER — FLUTICASONE PROPIONATE 50 MCG/ACT NA SUSP
2.0000 | Freq: Every day | NASAL | Status: DC | PRN
  Filled 2017-05-15: qty 16

## 2017-05-15 MED ORDER — PANTOPRAZOLE SODIUM 40 MG IV SOLR: 40.0000 mg | Freq: Two times a day (BID) | INTRAVENOUS | Status: DC

## 2017-05-15 MED ORDER — DONEPEZIL HCL 5 MG PO TABS
10.0000 mg | ORAL_TABLET | Freq: Every morning | ORAL | Status: DC
  Administered 2017-05-17 – 2017-05-18 (×2): 10 mg via ORAL
  Filled 2017-05-15 (×4): qty 2

## 2017-05-15 MED ORDER — LORAZEPAM 2 MG/ML IJ SOLN
1.0000 mg | Freq: Once | INTRAMUSCULAR | Status: AC
  Administered 2017-05-15: 1 mg via INTRAVENOUS
  Filled 2017-05-15: qty 1

## 2017-05-15 MED ORDER — ACETAMINOPHEN 650 MG RE SUPP: 650.0000 mg | Freq: Four times a day (QID) | RECTAL | Status: DC | PRN

## 2017-05-15 MED ORDER — SODIUM CHLORIDE 0.9 % IV SOLN: 80.0000 mg | Freq: Once | INTRAVENOUS | Status: DC

## 2017-05-15 MED ORDER — DOCUSATE SODIUM 100 MG PO CAPS
100.0000 mg | ORAL_CAPSULE | Freq: Two times a day (BID) | ORAL | Status: DC
  Administered 2017-05-17 – 2017-05-18 (×2): 100 mg via ORAL
  Filled 2017-05-15 (×4): qty 1

## 2017-05-15 MED ORDER — LORAZEPAM 2 MG/ML IJ SOLN
1.0000 mg | Freq: Once | INTRAMUSCULAR | Status: AC
  Administered 2017-05-15: 1 mg via INTRAVENOUS

## 2017-05-15 MED ORDER — PIPERACILLIN-TAZOBACTAM 3.375 G IVPB
3.3750 g | Freq: Three times a day (TID) | INTRAVENOUS | Status: DC
  Administered 2017-05-15 – 2017-05-17 (×5): 3.375 g via INTRAVENOUS
  Filled 2017-05-15 (×5): qty 50

## 2017-05-15 MED ORDER — NICOTINE 21 MG/24HR TD PT24
21.0000 mg | MEDICATED_PATCH | Freq: Every day | TRANSDERMAL | Status: DC
  Administered 2017-05-15 – 2017-05-18 (×4): 21 mg via TRANSDERMAL
  Filled 2017-05-15 (×4): qty 1

## 2017-05-15 MED ORDER — ONDANSETRON HCL 4 MG/2ML IJ SOLN: 4.0000 mg | Freq: Four times a day (QID) | INTRAMUSCULAR | Status: DC | PRN

## 2017-05-15 MED ORDER — SODIUM CHLORIDE 0.9 % IV SOLN
INTRAVENOUS | Status: DC
  Administered 2017-05-15 – 2017-05-17 (×5): via INTRAVENOUS

## 2017-05-15 MED ORDER — LEVOTHYROXINE SODIUM 100 MCG PO TABS: 100.0000 ug | ORAL_TABLET | Freq: Every day | ORAL | Status: DC

## 2017-05-15 MED ORDER — PANTOPRAZOLE SODIUM 40 MG IV SOLR
40.0000 mg | Freq: Two times a day (BID) | INTRAVENOUS | Status: DC
  Administered 2017-05-15: 40 mg via INTRAVENOUS
  Filled 2017-05-15: qty 40

## 2017-05-15 MED ORDER — MORPHINE SULFATE (PF) 2 MG/ML IV SOLN
1.0000 mg | INTRAVENOUS | Status: DC | PRN
  Administered 2017-05-15: 1 mg via INTRAVENOUS
  Filled 2017-05-15: qty 1

## 2017-05-15 MED ORDER — SODIUM CHLORIDE 0.9 % IV SOLN: 8.0000 mg/h | INTRAVENOUS | Status: DC

## 2017-05-15 MED ORDER — LORAZEPAM 2 MG/ML IJ SOLN
INTRAMUSCULAR | Status: AC
  Filled 2017-05-15: qty 1

## 2017-05-15 MED ORDER — ACETAMINOPHEN 325 MG PO TABS: 650.0000 mg | ORAL_TABLET | Freq: Four times a day (QID) | ORAL | Status: DC | PRN

## 2017-05-15 MED ORDER — DIAZEPAM 5 MG/ML IJ SOLN: 5.0000 mg | Freq: Once | INTRAMUSCULAR | Status: DC

## 2017-05-15 MED ORDER — PANTOPRAZOLE SODIUM 40 MG IV SOLR
INTRAVENOUS | Status: AC
  Administered 2017-05-15: 40 mg
  Filled 2017-05-15: qty 40

## 2017-05-15 MED ORDER — PIPERACILLIN-TAZOBACTAM 3.375 G IVPB 30 MIN
3.3750 g | INTRAVENOUS | Status: AC
  Administered 2017-05-15: 3.375 g via INTRAVENOUS
  Filled 2017-05-15: qty 50

## 2017-05-15 MED ORDER — HALOPERIDOL LACTATE 5 MG/ML IJ SOLN
1.0000 mg | INTRAMUSCULAR | Status: DC | PRN
  Administered 2017-05-15: 1 mg via INTRAVENOUS
  Filled 2017-05-15: qty 1

## 2017-05-15 NOTE — Progress Notes (Signed)
Unable to take orthostatics. Prior to transportation patient given Lorazepam for agitation and she is asleep.

## 2017-05-15 NOTE — Progress Notes (Signed)
Pharmacy Antibiotic Note  Tina [name] is a 83 y.o. female admitted on 05/15/2017 with IAI.  Pharmacy has been consulted for Zosyn dosing.  Plan: Zosyn 3.375g IV q8h (4 hour infusion).  Weight: 140 lb (63.5 kg)  Temp (24hrs), Avg:98.3 F (36.8 C), Min:98.3 F (36.8 C), Max:98.3 F (36.8 C)  Recent Labs  Lab 05/15/17 1012  WBC 13.8*  CREATININE 0.82    CrCl cannot be calculated (Unknown ideal weight.).    No Known Allergies  Antimicrobials this admission: Zosyn 11/17 >>   Dose adjustments this admission:  Microbiology results:  Thank you for allowing pharmacy to be a part of this patient's care.  Ulice Dash D 05/15/2017 11:39 AM

## 2017-05-15 NOTE — ED Notes (Signed)
Pt agitated and attempting to get out of bed. IV Ativan order received by Clearnce Hasten MD and administered. Pt bed ready per floor RN.

## 2017-05-15 NOTE — Consult Note (Signed)
Vonda Antigua, MD [address], [place], [place], [place], [address] [id number], [place], [place], [place], [address] Phone: [phone]  Fax: [phone]  Consultation  Referring Provider:   Dr. Doy Hutching  No ref. provider found Primary Care Physician:  Derinda Late, MD Primary Gastroenterologist:  Virgel Manifold, MD        Reason for Consultation:    Melena  Date of Admission:  05/15/2017 Date of Consultation:  05/15/2017         HPI:   Tina [name] is a 83 y.o. female admitted with acute confusion and, melena. Hx obtained from documents, patient's daughter at bedside, and hospital staff. Pt. Lives alone and her son her daughter typically call her every morning.  However, when her son called her and she did not answer her phone, he went to her house.  According to the daughter he found her confused and there was blood everywhere in the house, on the bed, in the bathroom, on the doors, on the phone.  Patient has not had any episodes of emesis that were witnessed by the family or in the hospital.  The patient's nurse reports that when her diaper was changed about 30 minutes before I saw her today, it consisted of melanotic stool.  Hemoglobin was 12.3 on presentation at 10 AM today, repeat is 11.2 at 9 PM.  Patient has history of dementia listed in her chart along with thyroid disease, anxiety, breast cancer.  Patient is on aspirin and Fosamax at home.  According to the patient's daughter patient is very independent, and does everything for herself and was working until 2 years ago.  Patient's daughter states that patient may have been taking over-the-counter pain medications for arthritis pain.  Past Medical History:  Diagnosis Date  . Anxiety   . Cancer ([place])    breast  . Dementia   . Hypothyroidism   . Shingles     Past Surgical History:  Procedure Laterality Date  . APPENDECTOMY    . BREAST SURGERY    . EYE SURGERY    . KYPHOPLASTY L4 N/A 06/27/2015   Performed by Hessie Knows, MD at [facility] ORS    Prior to Admission medications   Medication Sig Start Date End Date Taking? Authorizing Provider  alendronate (FOSAMAX) 70 MG tablet Take 1 tablet once a week by mouth. 02/12/17  Yes [provider]  aspirin 81 MG tablet Take 81 mg by mouth daily.   Yes [provider]  Calcium 500-100 MG-UNIT CHEW Chew 1 tablet 2 (two) times daily by mouth.   Yes [provider]  citalopram (CELEXA) 10 MG tablet Take 10 mg by mouth daily.   Yes [provider]  donepezil (ARICEPT) 10 MG tablet Take 10 mg by mouth every morning.   Yes [provider]  gabapentin (NEURONTIN) 300 MG capsule Take 600 mg 3 (three) times daily by mouth.    Yes [provider]  levothyroxine (SYNTHROID, LEVOTHROID) 100 MCG tablet Take 100 mcg daily before breakfast by mouth.    Yes [provider]  meloxicam (MOBIC) 15 MG tablet Take 15 mg by mouth daily.   Yes [provider]  Multiple Vitamin (MULTIVITAMIN) tablet Take 1 tablet by mouth daily.   Yes [provider]  fluticasone (FLONASE) 50 MCG/ACT nasal spray Place 2 sprays into both nostrils daily.    [provider]    History reviewed. No pertinent family history.   Social History   Tobacco  Use  . Smoking status: Current Every Day Smoker    Packs/day: 0.50  . Smokeless tobacco: Never Used  Substance Use Topics  . Alcohol use: No  . Drug use: No    Allergies as of 05/15/2017  . (No Known Allergies)    Review of Systems:    All systems reviewed and negative except where noted in HPI.   Physical Exam:  Vital signs in last 24 hours: Temp:  [97.9 F (36.6 C)-98.3 F (36.8 C)] 97.9 F (36.6 C) (11/17 1447) Pulse Rate:  [95-104] 104 (11/17 1447) Resp:  [14-24] 19 (11/17 1447) BP: (94-144)/(61-92) 118/71 (11/17 1447) SpO2:  [99 %-100 %] 100 % (11/17 1447) Weight:  [63.5 kg (140 lb)-70.8 kg (156 lb)] 70.8 kg (156 lb) (11/17  1447) Last BM Date: 05/15/17 General:   Pleasant, confused, not answering questions Head:  Normocephalic and atraumatic. Eyes:   No icterus.   Conjunctiva pink. PERRLA. Ears:  Normal auditory acuity. Neck:  Supple; no masses or thyroidomegaly Lungs: Respirations even and unlabored. Lungs clear to auscultation bilaterally.   No wheezes, crackles, or rhonchi.  Heart:  Regular rate and rhythm;  Without murmur, clicks, rubs or gallops Abdomen:  Soft, nondistended, nontender. Normal bowel sounds. No appreciable masses or hepatomegaly.  No rebound or guarding.  Neurologic:  Alert and oriented x0 Skin:  Intact without significant lesions or rashes. Cervical Nodes:  No significant cervical adenopathy.   LAB RESULTS: Recent Labs    05/15/17 1012  WBC 13.8*  HGB 12.3  HCT 38.4  PLT 209   BMET Recent Labs    05/15/17 1012  NA 143  K 4.5  CL 111  CO2 25  GLUCOSE 127*  BUN 53*  CREATININE 0.82  CALCIUM 9.0   LFT Recent Labs    05/15/17 1012  PROT 6.6  ALBUMIN 3.2*  AST 39  ALT 24  ALKPHOS 203*  BILITOT 1.0   PT/INR No results for input(s): LABPROT, INR in the last 72 hours.  STUDIES: Dg Chest 1 View  Result Date: 05/15/2017 CLINICAL DATA:  Confusion. EXAM: CHEST 1 VIEW COMPARISON:  None. FINDINGS: The heart size and mediastinal contours are within normal limits. Both lungs are clear. The visualized skeletal structures are unremarkable. IMPRESSION: No active disease. Electronically Signed   By: Dorise Bullion III M.D   On: 05/15/2017 10:39   Ct Head Wo Contrast  Result Date: 05/15/2017 CLINICAL DATA:  Altered level of consciousness. EXAM: CT HEAD WITHOUT CONTRAST TECHNIQUE: Contiguous axial images were obtained from the base of the skull through the vertex without intravenous contrast. COMPARISON:  11/02/2016 FINDINGS: Brain: There is atrophy and chronic small vessel disease changes. No acute intracranial abnormality. Specifically, no hemorrhage, hydrocephalus, mass  lesion, acute infarction, or significant intracranial injury. Vascular: No hyperdense vessel or unexpected calcification. Skull: No acute calvarial abnormality. Sinuses/Orbits: Visualized paranasal sinuses and mastoids clear. Orbital soft tissues unremarkable. Other: None IMPRESSION: No acute intracranial abnormality. Atrophy, chronic microvascular disease. Electronically Signed   By: Rolm Baptise M.D.   On: 05/15/2017 10:54      Impression / Plan:   Tina [name] is a 83 y.o. y/o female with acute confusion and reported melena with no significant drop in hemoglobin on admission   Although no significant drop in hemoglobin noted on repeat labs at 9 PM today, nursing staff reports melena to the evening noted in patient's diaper. Possible NSAID use at home Possible upper GI bleed due to peptic ulcer disease. Would continue Protonix  40 mg IV twice daily to treat any underlying peptic ulcer disease or esophagitis leading to her symptoms Avoid NSAIDs Serial CBCs and transfuse as needed Continue medical optimization prior to any endoscopic procedures Maintain 2 large-bore IV lines Low threshold for transfer to the ICU if patient develops signs of active GI bleeding or hemodynamic changes  Etiology of patient's acute confusion is not clear at this time and needs to be further evaluated Her mild drop in hemoglobin is unlikely to be the only etiology of her acute confusion Her white count is elevated to 16.7 UA is negative, chest x-ray does not show pneumonia, CT head is negative Might need to consider other sources of infection Would recommend pancultures Her alk phos is elevated, would recommend repeat CMP with next labs along with GGT Can also consider CT abdomen or repeat imaging of the hip depending on clinical course  Thank you for involving me in the care of this patient.      LOS: 0 days   Virgel Manifold, MD  05/15/2017, 6:03 PM

## 2017-05-15 NOTE — ED Triage Notes (Signed)
Pt came to ED via EMS from home, lives alone and normally is alert and oriented and can care for herself. Son tried to call pt this morning, unable to reach her so went out to check on her and found her confused. Per EMS, feces all over house.

## 2017-05-15 NOTE — H&P (Signed)
History and Physical    Tina [name] NLG:[id number] DOB: 08/12/1927 DOA: 05/15/2017  Referring physician: Dr. Clearnce Hasten PCP: Tina Late, MD  Specialists: none  Chief Complaint: confusion  HPI: Tina [name] is a 83 y.o. female has a past medical history significant for breast cancer, dementia, thyroid disease, and anxiety brought to ER by EMS with acute confusion with black, tarry stools. In ER, pt's hgb was lower than baseline with melena and guaiac positive stools. Pt is confused and unable to provide hx. She is now admitted. Per her son, she was "fine" last night.  Review of Systems: unable to obtain due to acute delirium  Past Medical History:  Diagnosis Date  . Anxiety   . Cancer ([place])    breast  . Dementia   . Hypothyroidism   . Shingles    Past Surgical History:  Procedure Laterality Date  . APPENDECTOMY    . BREAST SURGERY    . EYE SURGERY    . KYPHOPLASTY L4 N/A 06/27/2015   Performed by Tina Knows, MD at [facility] ORS   Social History:  reports that she has been smoking.  She has been smoking about 0.50 packs per day. she has never used smokeless tobacco. She reports that she does not drink alcohol or use drugs.  No Known Allergies  History reviewed. No pertinent family history.  Prior to Admission medications   Medication Sig Start Date End Date Taking? Authorizing Provider  alendronate (FOSAMAX) 70 MG tablet Take 1 tablet once a week by mouth. 02/12/17  Yes [provider]  aspirin 81 MG tablet Take 81 mg by mouth daily.   Yes [provider]  Calcium 500-100 MG-UNIT CHEW Chew 1 tablet 2 (two) times daily by mouth.   Yes [provider]  citalopram (CELEXA) 10 MG tablet Take 10 mg by mouth daily.   Yes [provider]  donepezil (ARICEPT) 10 MG tablet Take 10 mg by mouth every morning.   Yes [provider]  gabapentin (NEURONTIN) 300 MG capsule Take 600 mg 3 (three) times daily by mouth.    Yes [provider]  levothyroxine (SYNTHROID, LEVOTHROID) 100 MCG tablet Take 100 mcg daily before breakfast by mouth.    Yes [provider]  meloxicam (MOBIC) 15 MG tablet Take 15 mg by mouth daily.   Yes [provider]  Multiple Vitamin (MULTIVITAMIN) tablet Take 1 tablet by mouth daily.   Yes [provider]  fluticasone (FLONASE) 50 MCG/ACT nasal spray Place 2 sprays into both nostrils daily.    [provider]   Physical Exam: Vitals:   05/15/17 1008 05/15/17 1010 05/15/17 1015  BP:  94/70 (!) 144/92  Pulse:  95 97  Resp:  14 (!) 24  Temp:  98.3 F (36.8 C)   SpO2:  100% 99%  Weight: 63.5 kg (140 lb)       General:  No apparent distress, WDWN, [place]/AT  Eyes: PERRL, EOMI, no scleral icterus, conjunctiva clear  ENT: moist oropharynx without exudate, TM's benign, dentition fair  Neck: supple, no lymphadenopathy. No bruits or thyromegaly  Cardiovascular: rapid rate with regular rhythm without MRG; 2+ peripheral pulses, no JVD, no peripheral edema  Respiratory: CTA biL, good air movement without wheezing, rhonchi or crackled. Respiratory effort normal  Abdomen: soft, non tender to palpation, positive bowel sounds, no guarding, no rebound  Skin: no rashes or lesions  Musculoskeletal: normal bulk and tone, no joint swelling  Psychiatric: alert, oriented to  person only  Neurologic: CN 2-12 grossly intact, Motor strength 5/5 in all 4 groups with symmetric DTR's and non-focal sensory exam  Labs on Admission:  Basic Metabolic Panel: Recent Labs  Lab 05/15/17 1012  NA 143  K 4.5  CL 111  CO2 25  GLUCOSE 127*  BUN 53*  CREATININE 0.82  CALCIUM 9.0   Liver Function Tests: Recent Labs  Lab 05/15/17 1012  AST 39  ALT 24  ALKPHOS 203*  BILITOT 1.0  PROT 6.6  ALBUMIN 3.2*   Recent Labs  Lab 05/15/17 1009  LIPASE 37   No results for input(s): AMMONIA in the last 168 hours. CBC: Recent Labs  Lab 05/15/17 1012  WBC 13.8*   HGB 12.3  HCT 38.4  MCV 91.0  PLT 209   Cardiac Enzymes: Recent Labs  Lab 05/15/17 1009  TROPONINI <0.03    BNP (last 3 results) No results for input(s): BNP in the last 8760 hours.  ProBNP (last 3 results) No results for input(s): PROBNP in the last 8760 hours.  CBG: No results for input(s): GLUCAP in the last 168 hours.  Radiological Exams on Admission: Dg Chest 1 View  Result Date: 05/15/2017 CLINICAL DATA:  Confusion. EXAM: CHEST 1 VIEW COMPARISON:  None. FINDINGS: The heart size and mediastinal contours are within normal limits. Both lungs are clear. The visualized skeletal structures are unremarkable. IMPRESSION: No active disease. Electronically Signed   By: Dorise Bullion III M.D   On: 05/15/2017 10:39   Ct Head Wo Contrast  Result Date: 05/15/2017 CLINICAL DATA:  Altered level of consciousness. EXAM: CT HEAD WITHOUT CONTRAST TECHNIQUE: Contiguous axial images were obtained from the base of the skull through the vertex without intravenous contrast. COMPARISON:  11/02/2016 FINDINGS: Brain: There is atrophy and chronic small vessel disease changes. No acute intracranial abnormality. Specifically, no hemorrhage, hydrocephalus, mass lesion, acute infarction, or significant intracranial injury. Vascular: No hyperdense vessel or unexpected calcification. Skull: No acute calvarial abnormality. Sinuses/Orbits: Visualized paranasal sinuses and mastoids clear. Orbital soft tissues unremarkable. Other: None IMPRESSION: No acute intracranial abnormality. Atrophy, chronic microvascular disease. Electronically Signed   By: Tina [name] M.D.   On: 05/15/2017 10:54    EKG: Independently reviewed.  Assessment/Plan Principal Problem:   GI bleed Active Problems:   Acute blood loss anemia   Acute encephalopathy   Tachycardia   Will admit to floor with IV fluids, IV Protonix, and empiric IV ABX. Follow hgb closely and guaiac stools. UA pending. Consult GI. Consult PT and CSW. Repeat  labs in AM. Clear liquid diet  Diet: clear liquids Fluids: NS@75  DVT Prophylaxis: TED hose  Code Status: FULL  Family Communication: yes  Disposition Plan: SNF  Time spent: 50 min

## 2017-05-15 NOTE — Progress Notes (Signed)
Called Dr. Doy Hutching per family's request. Patient is very agitated, and constantly moving.  Patient has not been able to rest. Family hoping doctor will order some other med to help. This nurse spoke to Dr. Doy Hutching who asked that I put in order for 5mg  Valium IV one time dose.

## 2017-05-15 NOTE — ED Notes (Signed)
Report received from [facility].

## 2017-05-15 NOTE — Progress Notes (Signed)
Patient's family requested a nicotine patch for patient since she is a smoker. Thought it might help with some of her agitation. This nurse spoke to Dr. Doy Hutching who added the order.

## 2017-05-15 NOTE — Progress Notes (Signed)
Patient's family refuses teds for the patient at this time due to her extreme agitation.

## 2017-05-15 NOTE — Progress Notes (Signed)
Pharmacy called this nurse to advise that we do not carry Valium IV for patients. Sent text to Dr. Doy Hutching for further instructions. Called this nurse back and asked that I order Haldol 1mg  q4 PRN for agitation. Dr aware patient is not end of life care or on telemetry.

## 2017-05-15 NOTE — ED Provider Notes (Signed)
[facility] Emergency Department Provider Note  ___________________________________________   First MD Initiated Contact with Patient 05/15/17 1010     (approximate)  I have reviewed the triage vital signs and the nursing notes.   HISTORY  Chief Complaint Altered Mental Status   HPI AIANA NORDQUIST is a 83 y.o. female with a history of dementia and hypothyroidism who is presenting to the emergency department with an altered mental status as well as a GI bleed.  The patient's son knows that the patient was last normal at 6:30 PM yesterday.  He says that she is normally very sharp and suite and drives herself to the hairdresser every Thursday.  She has been very confused as of this morning when the son found her.  He says that he went over to check on her after she did not answer her phone.  Patient is unable to give a history and is alert and oriented x0 at this time.  She is awake and alert but when I asked her what is her name she just repeats "my name."  Patient's son said that there was a large amount of stool both in the patient's commode as well as throughout her house which appeared black.  Patient takes aspirin but no other blood thinners.  Son denies of knowing of any previous GI bleeds.    Past Medical History:  Diagnosis Date  . Anxiety   . Cancer ([place])    breast  . Dementia   . Hypothyroidism   . Shingles     There are no active problems to display for this patient.   Past Surgical History:  Procedure Laterality Date  . APPENDECTOMY    . BREAST SURGERY    . EYE SURGERY    . KYPHOPLASTY L4 N/A 06/27/2015   Performed by Hessie Knows, MD at [facility] ORS    Prior to Admission medications   Medication Sig Start Date End Date Taking? Authorizing Provider  alendronate (FOSAMAX) 70 MG tablet Take 1 tablet once a week by mouth. 02/12/17  Yes [provider]  aspirin 81 MG tablet Take 81 mg by mouth daily.   Yes [provider]    Calcium 500-100 MG-UNIT CHEW Chew 1 tablet 2 (two) times daily by mouth.   Yes [provider]  citalopram (CELEXA) 10 MG tablet Take 10 mg by mouth daily.   Yes [provider]  donepezil (ARICEPT) 10 MG tablet Take 10 mg by mouth every morning.   Yes [provider]  gabapentin (NEURONTIN) 300 MG capsule Take 600 mg 3 (three) times daily by mouth.    Yes [provider]  levothyroxine (SYNTHROID, LEVOTHROID) 100 MCG tablet Take 100 mcg daily before breakfast by mouth.    Yes [provider]  meloxicam (MOBIC) 15 MG tablet Take 15 mg by mouth daily.   Yes [provider]  Multiple Vitamin (MULTIVITAMIN) tablet Take 1 tablet by mouth daily.   Yes [provider]  fluticasone (FLONASE) 50 MCG/ACT nasal spray Place 2 sprays into both nostrils daily.    [provider]    Allergies Patient has no known allergies.  No family history on file.  Social History Social History   Tobacco Use  . Smoking status: Current Every Day Smoker    Packs/day: 0.50  . Smokeless tobacco: Never Used  Substance Use Topics  . Alcohol use: No  . Drug use: No    Review of Systems  Level 5 caveat  secondary to altered mental status.   ____________________________________________   PHYSICAL EXAM:  VITAL SIGNS: ED Triage Vitals  Enc Vitals Group     BP 05/15/17 1010 94/70     Pulse Rate 05/15/17 1010 95     Resp 05/15/17 1010 14     Temp 05/15/17 1010 98.3 F (36.8 C)     Temp src --      SpO2 05/15/17 1010 100 %     Weight 05/15/17 1008 140 lb (63.5 kg)     Height --      Head Circumference --      Peak Flow --      Pain Score --      Pain Loc --      Pain Edu? --      Excl. in [place]? --     Constitutional: Alert but unable to answer questions appropriately.  However, does shake her head no when asked if she is having any pain. in no acute distress. Eyes: Conjunctivae are normal.  Head: Atraumatic. Nose: No  congestion/rhinnorhea. Mouth/Throat: Mucous membranes are moist.  Neck: No stridor.   Cardiovascular: Tachycardic, regular rhythm. Grossly normal heart sounds.   Respiratory: Normal respiratory effort.  No retractions. Lungs CTAB. Gastrointestinal: Soft and nontender. No distention.  Melanotic stool, grossly, which is strongly heme positive. Musculoskeletal: No lower extremity tenderness nor edema.  No joint effusions. Neurologic:   No gross focal neurologic deficits are appreciated. Skin:  Skin is warm, dry and intact. No rash noted.   ____________________________________________   LABS (all labs ordered are listed, but only abnormal results are displayed)  Labs Reviewed  CBC - Abnormal; Notable for the following components:      Result Value   WBC 13.8 (*)    MCHC 31.9 (*)    RDW 16.1 (*)    All other components within normal limits  COMPREHENSIVE METABOLIC PANEL  LIPASE, BLOOD  TROPONIN I  URINALYSIS, COMPLETE (UACMP) WITH MICROSCOPIC  TSH  POC OCCULT BLOOD, ED  TYPE AND SCREEN   ____________________________________________  EKG  ED ECG REPORT I, Doran Stabler, the attending physician, personally viewed and interpreted this ECG.   Date: 05/15/2017  EKG Time: 1012  Rate: 101  Rhythm: sinus tachycardia  Axis: Normal  Intervals:none  ST&T Change: No ST segment elevation or depression.  No abnormal T wave inversion.  ____________________________________________  RADIOLOGY  Acute finding on the CT of the head nor the chest x-ray. ____________________________________________   PROCEDURES  Procedure(s) performed:   Procedures  Critical Care performed:   ____________________________________________   INITIAL IMPRESSION / ASSESSMENT AND PLAN / ED COURSE  Pertinent labs & imaging results that were available during my care of the patient were reviewed by me and considered in my medical decision making (see chart for details).  Differential diagnosis  includes, but is not limited to, alcohol, illicit or prescription medications, or other toxic ingestion; intracranial pathology such as stroke or intracerebral hemorrhage; fever or infectious causes including sepsis; hypoxemia and/or hypercarbia; uremia; trauma; endocrine related disorders such as diabetes, hypoglycemia, and thyroid-related diseases; hypertensive encephalopathy; etc. Gastrointestinal hemorrhage, diverticular bleed, bleeding stomach ulcer, bleeding duodenal ulcer, anemia  As part of my medical decision making, I reviewed the following data within the [place] from office visits   ----------------------------------------- 11:34 AM on 05/15/2017 -----------------------------------------  Patient remains altered.  Normal hemoglobin.  Patient will be admitted to the hospital.  Signed out to Dr. Doy Hutching.  Urinalysis is pending.  Nursing  aware of need for straight cath.  Possible that the altered mentation is from the GI bleed.  However, we will continue to look for different source.  This may include infection.  Ammonia pending as well.      ____________________________________________   FINAL CLINICAL IMPRESSION(S) / ED DIAGNOSES  GI bleeding.  Altered mental status.    NEW MEDICATIONS STARTED DURING THIS VISIT:  This SmartLink is deprecated. Use AVSMEDLIST instead to display the medication list for a patient.   Note:  This document was prepared using Dragon voice recognition software and may include unintentional dictation errors.     Orbie Pyo, MD 05/15/17 [phone]

## 2017-05-15 NOTE — ED Notes (Signed)
Bed not ready per RN. Will call when ready to receive pt.

## 2017-05-15 NOTE — Progress Notes (Signed)
MD notified of pt very agitated and appears to be in pain and pulling at right hip. Orders placed for hip xray. Will cont to monitor

## 2017-05-16 DIAGNOSIS — K922 Gastrointestinal hemorrhage, unspecified: Secondary | ICD-10-CM

## 2017-05-16 LAB — COMPREHENSIVE METABOLIC PANEL
ALBUMIN: 2.7 g/dL — AB (ref 3.5–5.0)
ALT: 19 U/L (ref 14–54)
AST: 36 U/L (ref 15–41)
Alkaline Phosphatase: 134 U/L — ABNORMAL HIGH (ref 38–126)
Anion gap: 5 (ref 5–15)
BILIRUBIN TOTAL: 0.8 mg/dL (ref 0.3–1.2)
BUN: 29 mg/dL — AB (ref 6–20)
CHLORIDE: 119 mmol/L — AB (ref 101–111)
CO2: 22 mmol/L (ref 22–32)
CREATININE: 0.69 mg/dL (ref 0.44–1.00)
Calcium: 7.8 mg/dL — ABNORMAL LOW (ref 8.9–10.3)
GFR calc Af Amer: >60 mL/min (ref >60)
GLUCOSE: 96 mg/dL (ref 65–99)
POTASSIUM: 3.6 mmol/L (ref 3.5–5.1)
Sodium: 146 mmol/L — ABNORMAL HIGH (ref 135–145)
TOTAL PROTEIN: 5.6 g/dL — AB (ref 6.5–8.1)

## 2017-05-16 LAB — GLUCOSE, CAPILLARY: GLUCOSE-CAPILLARY: 99 mg/dL (ref 65–99)

## 2017-05-16 LAB — CBC
HEMATOCRIT: 36.3 % (ref 35.0–47.0)
Hemoglobin: 12.1 g/dL (ref 12.0–16.0)
MCH: 30 pg (ref 26.0–34.0)
MCHC: 33.3 g/dL (ref 32.0–36.0)
MCV: 90.2 fL (ref 80.0–100.0)
PLATELETS: 126 10*3/uL — AB (ref 150–440)
RBC: 4.02 MIL/uL (ref 3.80–5.20)
RDW: 15.3 % — AB (ref 11.5–14.5)
WBC: 11.6 10*3/uL — AB (ref 3.6–11.0)

## 2017-05-16 LAB — MRSA PCR SCREENING: MRSA by PCR: NEGATIVE

## 2017-05-16 LAB — HEMOGLOBIN: Hemoglobin: 10.4 g/dL — ABNORMAL LOW (ref 12.0–16.0)

## 2017-05-16 LAB — ABO/RH: ABO/RH(D): A POS

## 2017-05-16 LAB — PREPARE RBC (CROSSMATCH)

## 2017-05-16 MED ORDER — SODIUM CHLORIDE 0.9 % IV BOLUS (SEPSIS)
500.0000 mL | Freq: Once | INTRAVENOUS | Status: AC
  Administered 2017-05-16: 500 mL via INTRAVENOUS

## 2017-05-16 MED ORDER — SODIUM CHLORIDE 0.9 % IV SOLN
Freq: Once | INTRAVENOUS | Status: AC
  Administered 2017-05-16: 03:00:00 via INTRAVENOUS

## 2017-05-16 MED ORDER — SODIUM CHLORIDE 0.9 % IV SOLN
8.0000 mg/h | INTRAVENOUS | Status: DC
  Administered 2017-05-16 – 2017-05-17 (×5): 8 mg/h via INTRAVENOUS
  Filled 2017-05-16 (×6): qty 80

## 2017-05-16 MED ORDER — ACETAMINOPHEN 10 MG/ML IV SOLN
1000.0000 mg | Freq: Once | INTRAVENOUS | Status: AC
  Administered 2017-05-16: 1000 mg via INTRAVENOUS
  Filled 2017-05-16: qty 100

## 2017-05-16 MED ORDER — LEVOTHYROXINE SODIUM 100 MCG IV SOLR
50.0000 ug | Freq: Every day | INTRAVENOUS | Status: DC
  Administered 2017-05-16 – 2017-05-17 (×2): 50 ug via INTRAVENOUS
  Filled 2017-05-16 (×3): qty 5

## 2017-05-16 MED ORDER — DIPHENHYDRAMINE HCL 50 MG/ML IJ SOLN
25.0000 mg | Freq: Once | INTRAMUSCULAR | Status: AC
  Administered 2017-05-16: 25 mg via INTRAVENOUS
  Filled 2017-05-16: qty 1

## 2017-05-16 MED ORDER — PANTOPRAZOLE SODIUM 40 MG IV SOLR: 40.0000 mg | Freq: Two times a day (BID) | INTRAVENOUS | Status: DC

## 2017-05-16 NOTE — Progress Notes (Signed)
eLink Physician-Brief Progress Note Patient Name: Tina [name] DOB: [birth year] MRN: [id number]   Date of Service  05/16/2017  HPI/Events of Note  83 yo female. Admitted with Dx of GI bleed. Had large dark stool and dropped BP. Nurses not comfortable with patient on floor --> transferred to ICU. BP = 105/50 and HR = 85. Hgb = 10.4. VSS.  eICU Interventions  No new orders.      Intervention Category Evaluation Type: New Patient Evaluation  Lysle Dingwall 05/16/2017, 6:37 AM

## 2017-05-16 NOTE — Progress Notes (Signed)
Pharmacy Antibiotic Note  Tina [name] is a 83 y.o. female admitted on 05/15/2017 with IAI.  Pharmacy has been consulted for Zosyn dosing.  Plan: Zosyn 3.375g IV q8h (4 hour infusion).  Height: 5' (152.4 cm) Weight: 156 lb (70.8 kg) IBW/kg (Calculated) : 45.5  Temp (24hrs), Avg:98.6 F (37 C), Min:97.8 F (36.6 C), Max:99.8 F (37.7 C)  Recent Labs  Lab 05/15/17 1012 05/15/17 2058 05/16/17 1023  WBC 13.8* 16.7* 11.6*  CREATININE 0.82  --  0.69    Estimated Creatinine Clearance: 43.5 mL/min (by C-G formula based on SCr of 0.69 mg/dL).    No Known Allergies  Antimicrobials this admission: Zosyn 11/17 >>   Dose adjustments this admission:  Microbiology results:  Thank you for allowing pharmacy to be a part of this patient's care.  Tina [name] A 05/16/2017 3:45 PM

## 2017-05-16 NOTE — Progress Notes (Signed)
Pt transferred to icu 03. Report given to ICU RN. Blood transfusion transfusing protonix gtt infusing and ns at 75 infusing. Daughter in ICU waiting room

## 2017-05-16 NOTE — Progress Notes (Signed)
MD notified of pt having 3 large tarry black stools since start of shift. VS taken BP 119/54 HR 107 sats 100% on RA. MD ordering stat hgb. Will cont to monitor

## 2017-05-16 NOTE — Evaluation (Signed)
Physical Therapy Evaluation Patient Details Name: Tina [name] MRN: [id number] DOB: 02-23-1928 Today's Date: 05/16/2017   History of Present Illness   83 yo female with onset of suspected GI bleed was admitted, has AMS, confusion, transfused this AM, has acute encephalopathy. Noted blood from NSAID use, has been living alone at home. PMHx:  chronic brain changes, OA hip, breast CA, dementia, hypothyroidism,   Clinical Impression  Pt is up to walk sidesteps with PT assisting her, very weak and steps short and tenuous.  Her plan is to get her to SNF placement to increase her strength and I as she was home alone.  However, may need to be supervised at home more dependably now, as in not living alone.  Follow acutely for strength and ROM exercises, balance and gait as tolerated.    Follow Up Recommendations SNF    Equipment Recommendations  None recommended by PT    Recommendations for Other Services       Precautions / Restrictions Precautions Precautions: Fall(telemetry) Restrictions Weight Bearing Restrictions: No      Mobility  Bed Mobility Overal bed mobility: Needs Assistance Bed Mobility: Supine to Sit     Supine to sit: Min assist;Mod assist     General bed mobility comments: minor assistance with trunk and legs to pivot to side of bed  Transfers Overall transfer level: Needs assistance Equipment used: 1 person hand held assist Transfers: Sit to/from Stand;Stand Pivot Transfers Sit to Stand: Min assist;Mod assist Stand pivot transfers: Mod assist;Min assist          Ambulation/Gait             General Gait Details: sidesteps with transfers only  Stairs            Wheelchair Mobility    Modified Rankin (Stroke Patients Only)       Balance Overall balance assessment: History of Falls;Needs assistance Sitting-balance support: Feet supported;Bilateral upper extremity supported Sitting balance-Leahy Scale: Fair     Standing balance  support: Bilateral upper extremity supported;During functional activity Standing balance-Leahy Scale: Poor                               Pertinent Vitals/Pain Pain Assessment: Faces Faces Pain Scale: Hurts little more Pain Location: peri area with cleaning by nursing Pain Descriptors / Indicators: Sore Pain Intervention(s): Limited activity within patient's tolerance;Monitored during session;Repositioned    Home Living Family/patient expects to be discharged to:: Skilled nursing facility Living Arrangements: Alone Available Help at Discharge: Family;Available PRN/intermittently Type of Home: House         Home Equipment: Walker - 2 wheels Additional Comments: pt's sons stepped out and did not get history from pt who is unreliable    Prior Function Level of Independence: Independent with assistive device(s)               Hand Dominance   Dominant Hand: Right    Extremity/Trunk Assessment   Upper Extremity Assessment Upper Extremity Assessment: Generalized weakness    Lower Extremity Assessment Lower Extremity Assessment: Generalized weakness    Cervical / Trunk Assessment Cervical / Trunk Assessment: Kyphotic  Communication   Communication: HOH  Cognition Arousal/Alertness: Awake/alert Behavior During Therapy: Flat affect Overall Cognitive Status: History of cognitive impairments - at baseline  General Comments      Exercises     Assessment/Plan    PT Assessment Patient needs continued PT services  PT Problem List Decreased strength;Decreased range of motion;Decreased activity tolerance;Decreased balance;Decreased mobility;Decreased coordination;Decreased cognition;Decreased knowledge of use of DME;Decreased safety awareness;Cardiopulmonary status limiting activity;Obesity;Decreased skin integrity;Pain       PT Treatment Interventions DME instruction;Gait training;Functional mobility  training;Therapeutic activities;Therapeutic exercise;Balance training;Neuromuscular re-education;Patient/family education    PT Goals (Current goals can be found in the Care Plan section)  Acute Rehab PT Goals Patient Stated Goal: none stated, agreed to OOB PT Goal Formulation: Patient unable to participate in goal setting Time For Goal Achievement: 05/30/17 Potential to Achieve Goals: Good    Frequency Min 2X/week   Barriers to discharge Inaccessible home environment;Decreased caregiver support home alone with cognitive changes and mod assist to move    Co-evaluation               AM-PAC PT "6 Clicks" Daily Activity  Outcome Measure Difficulty turning over in bed (including adjusting bedclothes, sheets and blankets)?: Unable Difficulty moving from lying on back to sitting on the side of the bed? : Unable Difficulty sitting down on and standing up from a chair with arms (e.g., wheelchair, bedside commode, etc,.)?: Unable Help needed moving to and from a bed to chair (including a wheelchair)?: A Lot Help needed walking in hospital room?: A Lot Help needed climbing 3-5 steps with a railing? : Total 6 Click Score: 8    End of Session   Activity Tolerance: Patient limited by fatigue;Treatment limited secondary to medical complications (Comment)(monitored her vitals via telemetry) Patient left: in chair;with call bell/phone within reach;with nursing/sitter in room Nurse Communication: Mobility status PT Visit Diagnosis: Unsteadiness on feet (R26.81);Muscle weakness (generalized) (M62.81);History of falling (Z91.81);Adult, failure to thrive (R62.7);Pain Pain - Right/Left: (perineum)    Time: 1779-3903 PT Time Calculation (min) (ACUTE ONLY): 24 min   Charges:   PT Evaluation $PT Eval Moderate Complexity: 1 Mod PT Treatments $Therapeutic Activity: 8-22 mins   PT G Codes:   PT G-Codes **NOT FOR INPATIENT CLASS** Functional Assessment Tool Used: AM-PAC 6 Clicks Basic  Mobility    Ramond Dial 05/16/2017, 3:06 PM   Mee Hives, PT MS Acute Rehab Dept. Number: [facility]

## 2017-05-16 NOTE — Progress Notes (Signed)
[facility] [place] NAME: Tina [name]    MR#:  [id number]  DATE OF BIRTH:  [birth year]  SUBJECTIVE:  CHIEF COMPLAINT: Patient's abdomen is distended and noticed black stool in a.m.  Daughter at bedside.  Getting blood transfusion, answering few questions  REVIEW OF SYSTEMS:  Limited review of system as the patient is uncomfortable from abdominal distention and pleasantly confused intermittently and has history of dementia  cONSTITUTIONAL: No fever, fatigue  RESPIRATORY: No cough, shortness of breath, wheezing or hemoptysis.  CARDIOVASCULAR: No chest pain, orthopnea, edema.  GASTROINTESTINAL: No nausea, vomiting, diarrhea or abdominal pain.  GENITOURINARY: No dysuria, hematuria.  SKIN: No rash or lesion. MUSCULOSKELETAL: No joint pain or arthritis.      DRUG ALLERGIES:  No Known Allergies  VITALS:  Blood pressure 114/63, pulse 88, temperature 98.3 F (36.8 C), resp. rate 17, height 5' (1.524 m), weight 70.8 kg (156 lb), SpO2 97 %.  PHYSICAL EXAMINATION:  GENERAL:  83 y.o.-year-old patient lying in the bed with no acute distress.  EYES: Pupils equal, round, reactive to light and accommodation. No scleral icterus. Extraocular muscles intact.  HEENT: Head atraumatic, normocephalic. Oropharynx and nasopharynx clear.  NECK:  Supple, no jugular venous distention. No thyroid enlargement, no tenderness.  LUNGS: Normal breath sounds bilaterally, no wheezing, rales,rhonchi or crepitation. No use of accessory muscles of respiration.  CARDIOVASCULAR: S1, S2 normal. No murmurs, rubs, or gallops.  ABDOMEN: Soft, nontender,distended. Bowel sounds present.  EXTREMITIES: No pedal edema, cyanosis, or clubbing.  NEUROLOGIC: Intermittent episodes of confusion PSYCHIATRIC: The patient is alert and oriented x1-2  SKIN: No obvious rash, lesion, or ulcer.    LABORATORY PANEL:   CBC Recent Labs  Lab 05/15/17 2058 05/16/17 0042  WBC 16.7*  --    HGB 11.2* 10.4*  HCT 34.4*  --   PLT 185  --    ------------------------------------------------------------------------------------------------------------------  Chemistries  Recent Labs  Lab 05/15/17 1012  NA 143  K 4.5  CL 111  CO2 25  GLUCOSE 127*  BUN 53*  CREATININE 0.82  CALCIUM 9.0  AST 39  ALT 24  ALKPHOS 203*  BILITOT 1.0   ------------------------------------------------------------------------------------------------------------------  Cardiac Enzymes Recent Labs  Lab 05/15/17 1009  TROPONINI <0.03   ------------------------------------------------------------------------------------------------------------------  RADIOLOGY:  Dg Chest 1 View  Result Date: 05/15/2017 CLINICAL DATA:  Confusion. EXAM: CHEST 1 VIEW COMPARISON:  None. FINDINGS: The heart size and mediastinal contours are within normal limits. Both lungs are clear. The visualized skeletal structures are unremarkable. IMPRESSION: No active disease. Electronically Signed   By: Dorise Bullion III M.D   On: 05/15/2017 10:39   Ct Head Wo Contrast  Result Date: 05/15/2017 CLINICAL DATA:  Altered level of consciousness. EXAM: CT HEAD WITHOUT CONTRAST TECHNIQUE: Contiguous axial images were obtained from the base of the skull through the vertex without intravenous contrast. COMPARISON:  11/02/2016 FINDINGS: Brain: There is atrophy and chronic small vessel disease changes. No acute intracranial abnormality. Specifically, no hemorrhage, hydrocephalus, mass lesion, acute infarction, or significant intracranial injury. Vascular: No hyperdense vessel or unexpected calcification. Skull: No acute calvarial abnormality. Sinuses/Orbits: Visualized paranasal sinuses and mastoids clear. Orbital soft tissues unremarkable. Other: None IMPRESSION: No acute intracranial abnormality. Atrophy, chronic microvascular disease. Electronically Signed   By: Rolm Baptise M.D.   On: 05/15/2017 10:54   Dg Hip Unilat With Pelvis  2-3 Views Right  Result Date: 05/15/2017 CLINICAL DATA:  Right-sided hip pain, initial encounter EXAM: DG HIP (WITH OR WITHOUT  PELVIS) 3V RIGHT COMPARISON:  None. FINDINGS: Pelvic ring is intact. Mild degenerative changes of the hip joints are noted. No acute fracture or dislocation is seen. No soft tissue abnormality is noted. IMPRESSION: Degenerative change without acute abnormality. Electronically Signed   By: Inez Catalina M.D.   On: 05/15/2017 20:44    EKG:   Orders placed or performed during the hospital encounter of 05/15/17  . ED EKG  . ED EKG  . EKG 12-Lead  . EKG 12-Lead    ASSESSMENT AND PLAN:     #Upper GI bleed with melena-probably from NSAID use N.p.o., Protonix drip Monitor hemoglobin hematocrit and transfuse as needed Hemoglobin 11.2-10.4 GI consult is pending  #Acute blood loss anemia from GI bleed Receiving blood transfusion, monitor hemoglobin and hematocrit closely  #Subjective fever afebrile today Patient is on empiric Zosyn unclear etiology  #Acute encephalopathy with history of dementia Etiology unclear patient is on empiric IV antibiotic Zosyn  #Hypothyroidism Continue Synthyroid  #History of breast cancer Needs outpatient surveillance   All the records are reviewed and case discussed with intensivist care Management/Social Workerr. Management plans discussed with the patient, daughter at bedside and they are in agreement.  CODE STATUS: fc  TOTAL TIME TAKING CARE OF THIS PATIENT:36 minutes.   POSSIBLE D/C IN 2-3 DAYS, DEPENDING ON CLINICAL CONDITION.  Note: This dictation was prepared with Dragon dictation along with smaller phrase technology. Any transcriptional errors that result from this process are unintentional.   Nicholes Mango M.D on 05/16/2017 at 9:11 AM  Between 7am to 6pm - Pager - [phone] After 6pm go to [email or web address] - password [email or web address]  [facility]  Office  [phone]  CC: Primary care physician;  Derinda Late, MD

## 2017-05-16 NOTE — Progress Notes (Signed)
1715 Report called to [place]

## 2017-05-16 NOTE — Progress Notes (Addendum)
MD on call notified of drop in pt BP to 94/46 currently receiving blood and another tarry black stool noted at this time. MD to place orders for transfer to ICU and 567ml bolus. Will cont to monitor

## 2017-05-16 NOTE — Progress Notes (Addendum)
Vonda Antigua , MD [address], [place], [place], [place], [address] [address], [place], [place], [place], [address] Phone: [phone]  Fax: [phone]   Tina [name] is being followed for melena Subjective: Patient looks much better today, sitting up in chair, alert, and answering questions.  I saw residual stool in her bed which was yellow in appearance, with no blood.  Denies any abdominal pain.  Denies any pain anywhere.  Does not know when she is in the hospital.   Objective: Vital signs in last 24 hours: Vitals:   05/16/17 1400 05/16/17 1500 05/16/17 1600 05/16/17 1700  BP: (!) 109/56 106/68 106/60 (!) 116/51  Pulse: 85 79 74 77  Resp: 20 (!) 21 16 17   Temp: 98.8 F (37.1 C)     TempSrc:      SpO2: 98% 97% 96% 97%  Weight:      Height:       Weight change:   Intake/Output Summary (Last 24 hours) at 05/16/2017 1911 Last data filed at 05/16/2017 1600 Gross per 24 hour  Intake 2619.08 ml  Output 1000 ml  Net 1619.08 ml     Exam: Heart:: Positive S1 positive S2 RRR no edema Lungs: Clear to auscultation bilaterally normal respiratory effort Abdomen: Soft, nontender, nondistended, no HSM   Lab Results: Reviewed, no acute drop in hemoglobin Micro Results: Recent Results (from the past 240 hour(s))  MRSA PCR Screening     Status: None   Collection Time: 05/16/17  5:52 AM  Result Value Ref Range Status   MRSA by PCR NEGATIVE NEGATIVE Final    Comment:        The GeneXpert MRSA Assay (FDA approved for NASAL specimens only), is one component of a comprehensive MRSA colonization surveillance program. It is not intended to diagnose MRSA infection nor to guide or monitor treatment for MRSA infections.    Studies/Results: Dg Chest 1 View  Result Date: 05/15/2017 CLINICAL DATA:  Confusion. EXAM: CHEST 1 VIEW COMPARISON:  None. FINDINGS: The heart size and mediastinal contours are within normal limits. Both lungs are clear. The visualized  skeletal structures are unremarkable. IMPRESSION: No active disease. Electronically Signed   By: Dorise Bullion III M.D   On: 05/15/2017 10:39   Ct Head Wo Contrast  Result Date: 05/15/2017 CLINICAL DATA:  Altered level of consciousness. EXAM: CT HEAD WITHOUT CONTRAST TECHNIQUE: Contiguous axial images were obtained from the base of the skull through the vertex without intravenous contrast. COMPARISON:  11/02/2016 FINDINGS: Brain: There is atrophy and chronic small vessel disease changes. No acute intracranial abnormality. Specifically, no hemorrhage, hydrocephalus, mass lesion, acute infarction, or significant intracranial injury. Vascular: No hyperdense vessel or unexpected calcification. Skull: No acute calvarial abnormality. Sinuses/Orbits: Visualized paranasal sinuses and mastoids clear. Orbital soft tissues unremarkable. Other: None IMPRESSION: No acute intracranial abnormality. Atrophy, chronic microvascular disease. Electronically Signed   By: Rolm Baptise M.D.   On: 05/15/2017 10:54   Dg Hip Unilat With Pelvis 2-3 Views Right  Result Date: 05/15/2017 CLINICAL DATA:  Right-sided hip pain, initial encounter EXAM: DG HIP (WITH OR WITHOUT PELVIS) 3V RIGHT COMPARISON:  None. FINDINGS: Pelvic ring is intact. Mild degenerative changes of the hip joints are noted. No acute fracture or dislocation is seen. No soft tissue abnormality is noted. IMPRESSION: Degenerative change without acute abnormality. Electronically Signed   By: Inez Catalina M.D.   On: 05/15/2017 20:44   Medications: I have reviewed the patient's current medications. Scheduled Meds: . citalopram  10 mg Oral Daily  . docusate sodium  100 mg Oral BID  . donepezil  10 mg Oral q morning - 10a  . levothyroxine  50 mcg Intravenous Daily  . nicotine  21 mg Transdermal Daily  . [START ON 05/19/2017] pantoprazole  40 mg Intravenous Q12H   Continuous Infusions: . sodium chloride 75 mL/hr at 05/16/17 1856  . pantoprozole (PROTONIX)  infusion 8 mg/hr (05/16/17 1409)  . piperacillin-tazobactam (ZOSYN)  IV 3.375 g (05/16/17 1856)   PRN Meds:.acetaminophen **OR** acetaminophen, bisacodyl, fluticasone, haloperidol lactate, LORazepam, morphine injection, ondansetron **OR** ondansetron (ZOFRAN) IV   Assessment: Principal Problem:   GI bleed Active Problems:   Acute blood loss anemia   Acute encephalopathy   Tachycardia    Plan: 83 year old female brought in by family due to acute confusion at home with reports of melena at home with no drop in hemoglobin since presentation  Stool noted in patient's bed today was yellow in color with no blood seen Her hemoglobin is completely stable and she does not seem to have any signs of active GI bleeding at this time. Risks of procedure would outweigh benefits in this elderly female with a very stable Hgb and no active GI bleeding. Would thus recommend conservative management with PPI at this time.  Etiology of acute confusion remains unknown, patient lives alone, and has dementia, and may have been taking higher doses of NSAIDs at home as per her daughter. Would recommend evaluating all her medications Confusion seems to be resolving at this time Continue to avoid NSAIDs.  Patient has meloxicam listed on her home medication list, this should not be continued on discharge Can advance diet if no further episodes of active GI bleeding and hemoglobin continues to stay stable At time of discharge continue Protonix twice daily for 30 days and then patient should follow-up with PCP and no further signs of bleeding or anemia this can be discontinued. Patient can follow-up in our GI clinic in 2-3 weeks.  LOS: 1 day   Varnita Tahiliani 05/16/2017, 7:11 PM

## 2017-05-16 NOTE — Progress Notes (Signed)
Received report from [place], Therapist, sports. Pt transported to room 151 from ICU. Skin Assessed with Wilhemena Durie, RN. Family at bedside. VSS. IV infusing. Pt on room air.

## 2017-05-16 NOTE — Clinical Social Work Note (Signed)
CSW received consult for SNF placement. CSW will follow pending PT recommendations.  Darran Gabay Martha Remijio Holleran, MSW, LCSWA [phone] 

## 2017-05-16 NOTE — Consult Note (Signed)
PULMONARY / CRITICAL CARE MEDICINE   Name: Tina [name] MRN: [id number] DOB: 1928/03/18    ADMISSION DATE:  05/15/2017   CONSULTATION DATE:  05/16/2017  REFERRING MD:  Dr Marcille Blanco  REASON: HYPOVOLEMIC SHOCK 2/2 gib  CHIEF COMPLAINT:  Bloody stool  HISTORY OF PRESENT ILLNESS:   This is an 83 year old Caucasian female with a past medical history as indicated below who was admitted earlier yesterday with complaints of bloody stools and altered mental status.  She was admitted for a GI bleed and acute encephalopathy.  This morning, patient had a large bloody stool and became hypotensive.  She was transferred to the ICU for further management. Her hemoglobin is still currently 10.4.  She is due to receive 2 units of packed red blood cells.  The patient will occasionally nod but unable to provide any history  PAST MEDICAL HISTORY :  She  has a past medical history of Anxiety, Cancer ([place]), Dementia, Hypothyroidism, and Shingles.  PAST SURGICAL HISTORY: She  has a past surgical history that includes Breast surgery; Appendectomy; Kyphoplasty (N/A, 06/27/2015); and Eye surgery.  No Known Allergies  No current facility-administered medications on file prior to encounter.    Current Outpatient Medications on File Prior to Encounter  Medication Sig  . alendronate (FOSAMAX) 70 MG tablet Take 1 tablet once a week by mouth.  Marland Kitchen aspirin 81 MG tablet Take 81 mg by mouth daily.  . Calcium 500-100 MG-UNIT CHEW Chew 1 tablet 2 (two) times daily by mouth.  . citalopram (CELEXA) 10 MG tablet Take 10 mg by mouth daily.  Marland Kitchen donepezil (ARICEPT) 10 MG tablet Take 10 mg by mouth every morning.  . gabapentin (NEURONTIN) 300 MG capsule Take 600 mg 3 (three) times daily by mouth.   . levothyroxine (SYNTHROID, LEVOTHROID) 100 MCG tablet Take 100 mcg daily before breakfast by mouth.   . meloxicam (MOBIC) 15 MG tablet Take 15 mg by mouth daily.  . Multiple Vitamin (MULTIVITAMIN) tablet Take 1 tablet by mouth  daily.  . fluticasone (FLONASE) 50 MCG/ACT nasal spray Place 2 sprays into both nostrils daily.    FAMILY HISTORY:  Her has no family status information on file.    SOCIAL HISTORY: She  reports that she has been smoking.  She has been smoking about 0.50 packs per day. she has never used smokeless tobacco. She reports that she does not drink alcohol or use drugs.  REVIEW OF SYSTEMS:   Unable to obtain due to patient's mental status  SUBJECTIVE:   VITAL SIGNS: BP 114/63   Pulse 85   Temp 99 F (37.2 C) (Oral)   Resp 17   Ht 5' (1.524 m)   Wt 70.8 kg (156 lb)   SpO2 97%   BMI 30.47 kg/m   HEMODYNAMICS:    VENTILATOR SETTINGS:    INTAKE / OUTPUT: I/O last 3 completed shifts: In: 1751.3 [P.O.:480; I.V.:1121.3; IV Piggyback:150] Out: -   PHYSICAL EXAMINATION: General: Well-nourished, well-developed, no acute distress Neuro: Awake, follows some commands, nonverbal HEENT: PERRLA, conjunctiva pink, trachea midline, neck is supple with no JVD Cardiovascular: Normal sinus rhythm, S1-S2, no murmur regurg or gallop, no edema, +2 pulses Lungs: Normal work of breathing, lungs are clear to auscultation bilaterally Abdomen: Abdomen is soft, nondistended, normal bowel sounds in all 4 quadrants, pain with gentle palpation of bilateral lower quadrants  Musculoskeletal: No visible deformities, positive range of motion Skin: Warm and dry, no rash or lesions  LABS:  BMET Recent Labs  Lab 05/15/17  1012  NA 143  K 4.5  CL 111  CO2 25  BUN 53*  CREATININE 0.82  GLUCOSE 127*    Electrolytes Recent Labs  Lab 05/15/17 1012  CALCIUM 9.0    CBC Recent Labs  Lab 05/15/17 1012 05/15/17 2058 05/16/17 0042  WBC 13.8* 16.7*  --   HGB 12.3 11.2* 10.4*  HCT 38.4 34.4*  --   PLT 209 185  --     Coag's Recent Labs  Lab 05/15/17 2058  INR 1.14    Sepsis Markers No results for input(s): LATICACIDVEN, PROCALCITON, O2SATVEN in the last 168 hours.  ABG No results for  input(s): PHART, PCO2ART, PO2ART in the last 168 hours.  Liver Enzymes Recent Labs  Lab 05/15/17 1012  AST 39  ALT 24  ALKPHOS 203*  BILITOT 1.0  ALBUMIN 3.2*    Cardiac Enzymes Recent Labs  Lab 05/15/17 1009  TROPONINI <0.03    Glucose Recent Labs  Lab 05/16/17 0539  GLUCAP 99    Imaging Dg Chest 1 View  Result Date: 05/15/2017 CLINICAL DATA:  Confusion. EXAM: CHEST 1 VIEW COMPARISON:  None. FINDINGS: The heart size and mediastinal contours are within normal limits. Both lungs are clear. The visualized skeletal structures are unremarkable. IMPRESSION: No active disease. Electronically Signed   By: Dorise Bullion III M.D   On: 05/15/2017 10:39   Ct Head Wo Contrast  Result Date: 05/15/2017 CLINICAL DATA:  Altered level of consciousness. EXAM: CT HEAD WITHOUT CONTRAST TECHNIQUE: Contiguous axial images were obtained from the base of the skull through the vertex without intravenous contrast. COMPARISON:  11/02/2016 FINDINGS: Brain: There is atrophy and chronic small vessel disease changes. No acute intracranial abnormality. Specifically, no hemorrhage, hydrocephalus, mass lesion, acute infarction, or significant intracranial injury. Vascular: No hyperdense vessel or unexpected calcification. Skull: No acute calvarial abnormality. Sinuses/Orbits: Visualized paranasal sinuses and mastoids clear. Orbital soft tissues unremarkable. Other: None IMPRESSION: No acute intracranial abnormality. Atrophy, chronic microvascular disease. Electronically Signed   By: Rolm Baptise M.D.   On: 05/15/2017 10:54   Dg Hip Unilat With Pelvis 2-3 Views Right  Result Date: 05/15/2017 CLINICAL DATA:  Right-sided hip pain, initial encounter EXAM: DG HIP (WITH OR WITHOUT PELVIS) 3V RIGHT COMPARISON:  None. FINDINGS: Pelvic ring is intact. Mild degenerative changes of the hip joints are noted. No acute fracture or dislocation is seen. No soft tissue abnormality is noted. IMPRESSION: Degenerative change  without acute abnormality. Electronically Signed   By: Inez Catalina M.D.   On: 05/15/2017 20:44   SIGNIFICANT EVENTS: 05/15/2017: Admitted 05/16/2017 transferred to the ICU for hypotension  LINES/TUBES: Peripheral IVs  DISCUSSION: 83 year old female presenting with hypovolemic shock secondary to GI bleed.  Blood pressure responding to volume expansion  ASSESSMENT  Hypovolemic shock Acute GI bleed Acute encephalopathy secondary to volume depletion-CT head negative Acute blood loss anemia Leukocytosis-WBC trending up; questionable intra-abdominal infection History of dementia and hypothyroidism  PLAN Hemodynamic monitoring per ICU protocol IV fluids Transfused packed red blood cells as ordered GI consulted, awaiting input Continue tonics infusion As needed Haldol for agitation Abdominal x-ray if persistent abdominal pain Continue Zosyn Check TSH Continue synthroid Change steroid to IV Keep patient n.p.o. until mental status improves and patient is able to tolerate oral intake GI and DVT prophylaxis  FAMILY  - Updates: No family at bedside, will update when available  - Inter-disciplinary family meet or Palliative Care meeting due by:  day 7   Magdalene S. Tukov ANP-BC Pulmonary and Critical  [place] Pager [phone] or [phone] 05/16/2017, 7:59 AM

## 2017-05-17 DIAGNOSIS — D62 Acute posthemorrhagic anemia: Secondary | ICD-10-CM

## 2017-05-17 LAB — TYPE AND SCREEN
ABO/RH(D): A POS
Antibody Screen: NEGATIVE
UNIT DIVISION: 0
UNIT DIVISION: 0

## 2017-05-17 LAB — BPAM RBC
BLOOD PRODUCT EXPIRATION DATE: 201812102359
Blood Product Expiration Date: 201812102359
ISSUE DATE / TIME: 201811180649
ISSUE DATE / TIME: 201811180253
UNIT TYPE AND RH: 6200
Unit Type and Rh: 6200

## 2017-05-17 MED ORDER — ENSURE ENLIVE PO LIQD
237.0000 mL | Freq: Two times a day (BID) | ORAL | Status: DC
  Administered 2017-05-17 – 2017-05-18 (×2): 237 mL via ORAL

## 2017-05-17 MED ORDER — ADULT MULTIVITAMIN W/MINERALS CH
1.0000 | ORAL_TABLET | Freq: Every day | ORAL | Status: DC
  Administered 2017-05-18: 1 via ORAL
  Filled 2017-05-17: qty 1

## 2017-05-17 NOTE — Progress Notes (Signed)
Vonda Antigua, MD [address] [place], [place], [place], [address] [address], [place], [place], [place], [address] Phone: [phone]  Fax: [phone]   Subjective: Patient tolerated full liquid diet.  No further episodes of bleeding.  Hemoglobin is around 12, last checked yesterday.  No abdominal pain.  Family at bedside   Objective: Vital signs in last 24 hours: Vitals:   05/16/17 2014 05/17/17 0417 05/17/17 0908 05/17/17 1958  BP: (!) 101/54 (!) 108/55 (!) 115/51 (!) 112/49  Pulse: 86 75 73 77  Resp: 18 16 20 18   Temp: 98.1 F (36.7 C) 97.8 F (36.6 C)  98.7 F (37.1 C)  TempSrc: Oral Oral  Oral  SpO2: 97% 96% 98% 97%  Weight:  73.7 kg (162 lb 6.4 oz)    Height:       Weight change: 10.2 kg (22 lb 6.4 oz)  Intake/Output Summary (Last 24 hours) at 05/17/2017 2056 Last data filed at 05/17/2017 1902 Gross per 24 hour  Intake 600 ml  Output 900 ml  Net -300 ml     Exam: Cardiac: +S1, +S2, RRR, No edema Pulm: CTA b/l, Normal Resp Effort Abd: Soft, NT/ND, No HSM Skin: Warm, no rashes Neck: Supple, Trachea midline   Lab Results: @LABTEST2 @ Micro Results: Recent Results (from the past 240 hour(s))  MRSA PCR Screening     Status: None   Collection Time: 05/16/17  5:52 AM  Result Value Ref Range Status   MRSA by PCR NEGATIVE NEGATIVE Final    Comment:        The GeneXpert MRSA Assay (FDA approved for NASAL specimens only), is one component of a comprehensive MRSA colonization surveillance program. It is not intended to diagnose MRSA infection nor to guide or monitor treatment for MRSA infections.    Studies/Results: No results found. Medications:  Scheduled Meds: . citalopram  10 mg Oral Daily  . docusate sodium  100 mg Oral BID  . donepezil  10 mg Oral q morning - 10a  . feeding supplement (ENSURE ENLIVE)  237 mL Oral BID BM  . levothyroxine  50 mcg Intravenous Daily  . [START ON 05/18/2017] multivitamin with minerals  1 tablet  Oral Daily  . nicotine  21 mg Transdermal Daily  . [START ON 05/19/2017] pantoprazole  40 mg Intravenous Q12H   Continuous Infusions: . sodium chloride 75 mL/hr at 05/17/17 0843  . pantoprozole (PROTONIX) infusion 8 mg/hr (05/17/17 1038)   PRN Meds:.acetaminophen **OR** acetaminophen, bisacodyl, fluticasone, haloperidol lactate, LORazepam, morphine injection, ondansetron **OR** ondansetron (ZOFRAN) IV   Assessment: Principal Problem:   GI bleed Active Problems:   Acute blood loss anemia   Acute encephalopathy   Tachycardia    Plan: GI bleed seems to have resolved at this time,  Source of bleeding was likely NSAID induced ulceration Continue PPI IV twice daily while inpatient In the setting of no active bleeding, and no chronic need for anticoagulation, risks of procedure would outweigh benefits in this elderly female.  EGD at this point will largely be diagnostic, and it would be best to medically manage the patient with PPI.  If patient continues to have melanotic stool or further anemia, please page GI and EGD can be considered. Patient should never be on NSAIDs lifelong Her hemoglobin should be rechecked 1 week after discharge by her primary care provider Protonix can be continued for 6-8 weeks and then discontinued if no further anemia. Risks of PPI use were discussed with including bone loss, C. Diff  diarrhea, pneumonia, infections, CKD, electrolyte abnormalities.   And also encourage antireflux measures, Head of bed elevated to 30 degrees at the time to prevent esophagitis.  Patient is on bisphosphonates which can lead to esophagitis as well.   I spent a lot of time talking to Patient's daughter and son today and answered all her questions to her satisfaction  Follow-up in GI clinic in 3-4 weeks    LOS: 2 days   Vonda Antigua, MD 05/17/2017, 8:56 PM

## 2017-05-17 NOTE — Progress Notes (Signed)
Initial Nutrition Assessment  DOCUMENTATION CODES:   Non-severe (moderate) malnutrition in context of social or environmental circumstances, Obesity unspecified  INTERVENTION:  Provide Ensure Enlive po BID, each supplement provides 350 kcal and 20 grams of protein. Patient prefers strawberry.  Provide daily multivitamin with minerals.  Initiate calorie count per consult.  Encouraged adequate intake of calories and protein at meals. Encouraged intake of small, frequent meals.  NUTRITION DIAGNOSIS:   Moderate Malnutrition related to social / environmental circumstances(lives alone, inadequate intake) as evidenced by mild fat depletion, mild muscle depletion.  GOAL:   Patient will meet greater than or equal to 90% of their needs  MONITOR:   PO intake, Supplement acceptance, Labs, Weight trends, I & O's  REASON FOR ASSESSMENT:   Consult Assessment of nutrition requirement/status, Diet education, Poor PO, Calorie Count  ASSESSMENT:   83 year old female with PMHx of shingles, breast cancer, hypothyroidism, anxiety, dementia who is admitted with upper GI bleed likely from NSAID use, acute blood loss anemia.   -SLP evaluated patient today and recommends mechanical soft/regular diet with thin liquids. Patient currently on FLD due to GI bleed.  Met with patient, her daughter, and son-in-law at bedside. Daughter and son-in-law live in [place]. Patient is somewhat confused, but able to provide some history. Patient reports her appetite is fine. Daughter reports it has been poor for a while now. She reports patient eats small amounts at meals and eats very slowly. So far today patient has had some broth, yogurt, and ice cream. She usually lives alone at home. For breakfast she has cereal in milk. Lunch is usually a sandwich or salad. Dinner may be chicken fingers or other food from [employer], but very small amounts per daughter (may take patient a few days to eat one chicken finger). Patient is  amenable to trying Ensure Enlive in strawberry flavor.  Patient is unsure of UBW. Family believes it may be 145-152 lbs. They believe patient has been weight stable. Per chart patient has been gaining weight (was 155 lbs earlier this year).  Medications reviewed and include: Colace, levothyroxine, pantoprazole, NS @ 75 mL/hr.  Labs reviewed: CBG 99, Sodium 146, Chloride 119, BUN 29.  Discussed with RN.  NUTRITION - FOCUSED PHYSICAL EXAM:    Most Recent Value  Orbital Region  Mild depletion  Upper Arm Region  Mild depletion  Thoracic and Lumbar Region  Mild depletion  Buccal Region  Mild depletion  Temple Region  Mild depletion  Clavicle Bone Region  Mild depletion  Clavicle and Acromion Bone Region  Mild depletion  Scapular Bone Region  Mild depletion  Dorsal Hand  Mild depletion  Patellar Region  No depletion  Anterior Thigh Region  No depletion  Posterior Calf Region  No depletion  Edema (RD Assessment)  None  Hair  Reviewed  Eyes  Reviewed  Mouth  Reviewed  Skin  Reviewed  Nails  Reviewed     Diet Order:  Diet full liquid Room service appropriate? Yes; Fluid consistency: Thin  EDUCATION NEEDS:   No education needs have been identified at this time  Skin:  Skin Assessment: Reviewed RN Assessment  Last BM:  05/17/2017 - small type 6  Height:   Ht Readings from Last 1 Encounters:  05/15/17 5' (1.524 m)    Weight:   Wt Readings from Last 1 Encounters:  05/17/17 162 lb 6.4 oz (73.7 kg)    Ideal Body Weight:  45.5 kg  BMI:  Body mass index is 31.72 kg/m.  Estimated Nutritional Needs:   Kcal:  1430-1650 (MSJ x 1.3-1.5)  Protein:  60-70 grams (1.4-1.6 grams/kg IBW)  Fluid:  1.2 L/day (25 mL/kg IBW)  Willey Blade, MS, RD, LDN Office: [phone] Pager: [phone] After Hours/Weekend Pager: [phone]

## 2017-05-17 NOTE — Progress Notes (Signed)
Notified Dr. Bonna Gains that family had questions about her care and treatment. MD to come see the patient and family and today.

## 2017-05-17 NOTE — Progress Notes (Signed)
Physical Therapy Treatment Patient Details Name: ELFREDA BLANCHET MRN: [id number] DOB: [birth year] Today's Date: 05/17/2017    History of Present Illness  83 yo female who presented to ER secondary to black, tarry stools; admitted with GIB.  Status post blood transfusion; current HgB 12.1.  Hospital course complicated by intermittent confusion, agitation; appears cleared and at baseline.  PMHx:  chronic brain changes, OA hip, breast CA, dementia, hypothyroidism,     PT Comments    Marked improvement in functional performance and overall activity tolerance this date.  Ambulates >250' around nursing station without assist device, close sup; mild balance instability noted (unable to maintain gait with cognitive tasks, head turns, decreased ability to modulate gait speed), but no overt buckling or LOB.  Patient/family feels performance is near baseline and is happy with progress from date of admission. Daughter planning to stay with patient and provide 24/7 sup for 1-2 weeks at discharge. Given improvement noted and level of assist available upon discharge, discharge recommendations upgraded to reflect home with HHPT.  Patient, family and CSW/RNCM informed/aware.    Follow Up Recommendations  Home health PT     Equipment Recommendations       Recommendations for Other Services       Precautions / Restrictions Precautions Precautions: Fall Restrictions Weight Bearing Restrictions: No    Mobility  Bed Mobility Overal bed mobility: Modified Independent                Transfers Overall transfer level: Needs assistance Equipment used: Rolling walker (2 wheeled) Transfers: Sit to/from Stand Sit to Stand: Supervision         General transfer comment: cuing for hand placement to prevent pulling on RW  Ambulation/Gait Ambulation/Gait assistance: Supervision Ambulation Distance (Feet): 50 Feet Assistive device: Rolling walker (2 wheeled)       General Gait Details:  reciprocal stepping pattern with fair step height/length; decreased cadnece and gait speed, but no overt buckling or LOB.  Minimal use of RW with mobility efforts; removed for additional trials.   Stairs            Wheelchair Mobility    Modified Rankin (Stroke Patients Only)       Balance Overall balance assessment: Needs assistance Sitting-balance support: No upper extremity supported;Feet supported Sitting balance-Leahy Scale: Good     Standing balance support: No upper extremity supported Standing balance-Leahy Scale: Fair                              Cognition Arousal/Alertness: Awake/alert Behavior During Therapy: WFL for tasks assessed/performed Overall Cognitive Status: Within Functional Limits for tasks assessed                                 General Comments: immediate and delayed recall 3/3 (increased time)      Exercises Other Exercises Other Exercises: 300' without assist device, cga/close sup-improved fluidity without use of assist device.  Unable to maintain gait speed/performance with cognitive tasks, head turns (often stopping gait to complete); limited ability to modulate gait speed,  but no overt bucklnig or LOB.   Other Exercises: 10' walk time without assist device, 10 seconds Other Exercises: Toilet transfer, ambulatory without assist device, sup; sit/stand from standard toilet without assist device, close sup (multiple attempts to complete)    General Comments        Pertinent Vitals/Pain Pain  Assessment: No/denies pain    Home Living                      Prior Function            PT Goals (current goals can now be found in the care plan section) Acute Rehab PT Goals Patient Stated Goal: none stated, agreed to OOB PT Goal Formulation: Patient unable to participate in goal setting Time For Goal Achievement: 05/30/17 Potential to Achieve Goals: Good Progress towards PT goals: Progressing toward  goals    Frequency    Min 2X/week      PT Plan Current plan remains appropriate    Co-evaluation              AM-PAC PT "6 Clicks" Daily Activity  Outcome Measure  Difficulty turning over in bed (including adjusting bedclothes, sheets and blankets)?: A Little Difficulty moving from lying on back to sitting on the side of the bed? : A Little Difficulty sitting down on and standing up from a chair with arms (e.g., wheelchair, bedside commode, etc,.)?: A Little Help needed moving to and from a bed to chair (including a wheelchair)?: A Little Help needed walking in hospital room?: A Little Help needed climbing 3-5 steps with a railing? : A Lot 6 Click Score: 17    End of Session Equipment Utilized During Treatment: Gait belt Activity Tolerance: Patient tolerated treatment well Patient left: in chair;with call bell/phone within reach;with chair alarm set;with family/visitor present Nurse Communication: Mobility status PT Visit Diagnosis: Unsteadiness on feet (R26.81);Muscle weakness (generalized) (M62.81);History of falling (Z91.81);Adult, failure to thrive (R62.7);Pain     Time: 7673-4193 PT Time Calculation (min) (ACUTE ONLY): 33 min  Charges:  $Gait Training: 8-22 mins $Therapeutic Activity: 8-22 mins                    G Codes:       Tenia Goh H. Owens Shark, [place], DPT, NCS 05/17/17, 4:57 PM [phone]

## 2017-05-17 NOTE — Progress Notes (Signed)
[facility] [place] NAME: Tina [name]    MR#:  [id number]  DATE OF BIRTH:  Oct 22, 1927  SUBJECTIVE:  CHIEF COMPLAINT: Patient's abdomen is distended and noticed black stool in a.m.  Daughter at bedside.  Getting blood transfusion, answering few questions  REVIEW OF SYSTEMS:  Limited review of system as the patient is uncomfortable from abdominal distention and pleasantly confused intermittently and has history of dementia  cONSTITUTIONAL: No fever, fatigue  RESPIRATORY: No cough, shortness of breath, wheezing or hemoptysis.  CARDIOVASCULAR: No chest pain, orthopnea, edema.  GASTROINTESTINAL: No nausea, vomiting, diarrhea or abdominal pain.  GENITOURINARY: No dysuria, hematuria.  SKIN: No rash or lesion. MUSCULOSKELETAL: No joint pain or arthritis.      DRUG ALLERGIES:  No Known Allergies  VITALS:  Blood pressure (!) 115/51, pulse 73, temperature 97.8 F (36.6 C), temperature source Oral, resp. rate 20, height 5' (1.524 m), weight 73.7 kg (162 lb 6.4 oz), SpO2 98 %.  PHYSICAL EXAMINATION:  GENERAL:  83 y.o.-year-old patient lying in the bed with no acute distress.  EYES: Pupils equal, round, reactive to light and accommodation. No scleral icterus. Extraocular muscles intact.  HEENT: Head atraumatic, normocephalic. Oropharynx and nasopharynx clear.  NECK:  Supple, no jugular venous distention. No thyroid enlargement, no tenderness.  LUNGS: Normal breath sounds bilaterally, no wheezing, rales,rhonchi or crepitation. No use of accessory muscles of respiration.  CARDIOVASCULAR: S1, S2 normal. No murmurs, rubs, or gallops.  ABDOMEN: Soft, nontender,distended. Bowel sounds present.  EXTREMITIES: No pedal edema, cyanosis, or clubbing.  NEUROLOGIC: Intermittent episodes of confusion PSYCHIATRIC: The patient is alert and oriented x1-2  SKIN: No obvious rash, lesion, or ulcer.    LABORATORY PANEL:   CBC Recent Labs  Lab  05/16/17 1023  WBC 11.6*  HGB 12.1  HCT 36.3  PLT 126*   ------------------------------------------------------------------------------------------------------------------  Chemistries  Recent Labs  Lab 05/16/17 1023  NA 146*  K 3.6  CL 119*  CO2 22  GLUCOSE 96  BUN 29*  CREATININE 0.69  CALCIUM 7.8*  AST 36  ALT 19  ALKPHOS 134*  BILITOT 0.8   ------------------------------------------------------------------------------------------------------------------  Cardiac Enzymes Recent Labs  Lab 05/15/17 1009  TROPONINI <0.03   ------------------------------------------------------------------------------------------------------------------  RADIOLOGY:  Dg Hip Unilat With Pelvis 2-3 Views Right  Result Date: 05/15/2017 CLINICAL DATA:  Right-sided hip pain, initial encounter EXAM: DG HIP (WITH OR WITHOUT PELVIS) 3V RIGHT COMPARISON:  None. FINDINGS: Pelvic ring is intact. Mild degenerative changes of the hip joints are noted. No acute fracture or dislocation is seen. No soft tissue abnormality is noted. IMPRESSION: Degenerative change without acute abnormality. Electronically Signed   By: Inez Catalina M.D.   On: 05/15/2017 20:44    EKG:   Orders placed or performed during the hospital encounter of 05/15/17  . ED EKG  . ED EKG  . EKG 12-Lead  . EKG 12-Lead    ASSESSMENT AND PLAN:     #Upper GI bleed with melena-probably from NSAID use Clinically improving, advance diet as tolerated Monitor hemoglobin hematocrit and transfuse as needed Hemoglobin 11.2-10.4-12.1 GI recommends to stop NSAIDs and Protonix outpatient follow-up   #Acute blood loss anemia from GI bleed Receiving blood transfusion, monitor hemoglobin and hematocrit closely  #Subjective fever, leukocytosis-could be inflammatory afebrile today, WBC is trending down Patient is on empiric Zosyn unclear etiology, will d/c she spiked fevers only one time  #Acute encephalopathy with history of  dementia Clinically better and at her baseline  empiric IV antibiotic Zosyn discontinued  #Hypothyroidism Continue Synthyroid  #History of breast cancer Needs outpatient surveillance   ST : Mech Soft/Regular diet w/ Thin liquids; general aspiration precautions  PT is recommending SNF, daughter and son will d/w mom regarding SnF versus home health after talking to gastroenterology  All the records are reviewed and case discussed with intensivist care Management/Social Workerr. Management plans discussed with the patient, daughter and son at bedside and they are in agreement.  CODE STATUS: fc  TOTAL TIME TAKING CARE OF THIS PATIENT:36 minutes.   POSSIBLE D/C IN 2-3 DAYS, DEPENDING ON CLINICAL CONDITION.  Note: This dictation was prepared with Dragon dictation along with smaller phrase technology. Any transcriptional errors that result from this process are unintentional.   Nicholes Mango M.D on 05/17/2017 at 2:47 PM  Between 7am to 6pm - Pager - [phone] After 6pm go to [email or web address] - password [email or web address] [facility]  [place] [facility]  Office  [phone]  CC: Primary care physician; Derinda Late, MD

## 2017-05-17 NOTE — Evaluation (Signed)
Clinical/Bedside Swallow Evaluation Patient Details  Name: Tina [name] MRN: [id number] Date of Birth: 1927/12/31  Today's Date: 05/17/2017 Time: SLP Start Time (ACUTE ONLY): 7829 SLP Stop Time (ACUTE ONLY): 5621 SLP Time Calculation (min) (ACUTE ONLY): 60 min  Past Medical History:  Past Medical History:  Diagnosis Date  . Anxiety   . Cancer ([place])    breast  . Dementia   . Hypothyroidism   . Shingles    Past Surgical History:  Past Surgical History:  Procedure Laterality Date  . APPENDECTOMY    . BREAST SURGERY    . EYE SURGERY    . KYPHOPLASTY L4 N/A 06/27/2015   Performed by Hessie Knows, MD at [facility] ORS   HPI:  Pt is a 83 y.o. female has a past medical history significant for breast cancer, Dementia, thyroid disease, and anxiety brought to ER by EMS with acute confusion with black, tarry stools. In ER, pt's hgb was lower than baseline with melena and guaiac positive stools. Pt is confused and unable to provide hx. She is now admitted. Per her son, she was "fine" last night. She has been tolerating a clear liquid diet per NSG w/ no overt s/s of aspiration noted by staff.   Assessment / Plan / Recommendation Clinical Impression  Pt appears to present w/ adequate oropharyngeal phase swallow function w/ no overt s/s of aspiration w/ trials of thin liquids and purees/soft solids. Pt consumed po trials w/ no apparent difficulty during oral phase of bolus management and clearing followed by timely transfer and swallow w/ all trials. Pt exhibited no decline in vocal quality or respiratory status during po trials. Family endorsed pt had been doing "well" drinking thin liquids as part of her GI diet d/t GI issues currently. Pt has also been swallowing Pills whole w/ water. Limited trials given of the solids d/t the GI diet upgrading only to full liquids at this time. When pt is able, a mech soft/regular diet consistency would be adequate for pt following general aspiration precautions as  discussed w/ pt/family; instruction given on swallowing Pills w/ a puree if needed in future. No further skilled ST services indicated at this time as pt appears at her baseline w/ regard to oropharyngeal phase swallowing. Pt/family agreed. NSG to reconsult if any decline in status is noted during this admission.  SLP Visit Diagnosis: Dysphagia, unspecified (R13.10)    Aspiration Risk  (reduced)    Diet Recommendation  Mech Soft/Regular diet w/ Thin liquids; general aspiration precautions; setup support at meals d/t baseline Dementia/cognitive status  Medication Administration: Whole meds with liquid(but instruction given in taking Whole w/ a Puree)    Other  Recommendations Recommended Consults: (GI following) Oral Care Recommendations: Oral care BID;Patient independent with oral care;Staff/trained caregiver to provide oral care Other Recommendations: (n/a)   Follow up Recommendations None      Frequency and Duration (n/a)          Prognosis Prognosis for Safe Diet Advancement: Good Barriers to Reach Goals: (n/a)      Swallow Study   General Date of Onset: 05/15/17 HPI: Pt is a 83 y.o. female has a past medical history significant for breast cancer, Dementia, thyroid disease, and anxiety brought to ER by EMS with acute confusion with black, tarry stools. In ER, pt's hgb was lower than baseline with melena and guaiac positive stools. Pt is confused and unable to provide hx. She is now admitted. Per her son, she was "fine" last night. She has  been tolerating a clear liquid diet per NSG w/ no overt s/s of aspiration noted by staff. Type of Study: Bedside Swallow Evaluation Previous Swallow Assessment: none reported Diet Prior to this Study: Regular;Thin liquids("but softer foods") Temperature Spikes Noted: No(wbc down to 11.6 from 16) Respiratory Status: Room air History of Recent Intubation: No Behavior/Cognition: Alert;Cooperative;Pleasant mood;Distractible;Requires  cueing(min) Oral Cavity Assessment: Within Functional Limits Oral Care Completed by SLP: Recent completion by staff Oral Cavity - Dentition: (dentition) Vision: Functional for self-feeding Self-Feeding Abilities: Able to feed self;Needs set up(support) Patient Positioning: Upright in bed(positioning support) Baseline Vocal Quality: Normal Volitional Cough: Strong Volitional Swallow: Able to elicit    Oral/Motor/Sensory Function Overall Oral Motor/Sensory Function: Within functional limits   Ice Chips Ice chips: Within functional limits Presentation: Spoon(fed; 1 trial) Other Comments: pt has been drinking thin liquids in room on a clear liquid diet per MD   Thin Liquid Thin Liquid: Within functional limits Presentation: Cup;Self Fed;Straw(~4+ ozs)    Nectar Thick Nectar Thick Liquid: Not tested   Honey Thick Honey Thick Liquid: Not tested   Puree Puree: Within functional limits Presentation: Self Fed;Spoon(assisted; 4 trials)   Solid   GO   Solid: Within functional limits Presentation: Self Fed(1 trial only of graham cracker) Other Comments: d/t GI status per NSG/MD; pt is to upgrade to a full liquid diet as per GI order    Functional Assessment Tool Used: clinical judgement Functional Limitations: Swallowing Swallow Current Status (E1007): At least 1 percent but less than 20 percent impaired, limited or restricted Swallow Goal Status [phone]): At least 1 percent but less than 20 percent impaired, limited or restricted Swallow Discharge Status [phone]): At least 1 percent but less than 20 percent impaired, limited or restricted     Orinda Kenner, MS, CCC-SLP Watson,Katherine 05/17/2017,2:42 PM

## 2017-05-17 NOTE — Clinical Social Work Note (Signed)
CSW received referral for SNF.  PT saw patient and they are recommending home health PT, plan is to discharge home with home health.  CSW to sign off please re-consult if social work needs arise.  Jones Broom. [place], MSW, [place]

## 2017-05-18 ENCOUNTER — Encounter: Payer: Self-pay | Admitting: *Deleted

## 2017-05-18 LAB — CBC
HCT: 33.7 % — ABNORMAL LOW (ref 35.0–47.0)
Hemoglobin: 11.4 g/dL — ABNORMAL LOW (ref 12.0–16.0)
MCH: 30.4 pg (ref 26.0–34.0)
MCHC: 33.8 g/dL (ref 32.0–36.0)
MCV: 90.1 fL (ref 80.0–100.0)
PLATELETS: 89 10*3/uL — AB (ref 150–440)
RBC: 3.74 MIL/uL — AB (ref 3.80–5.20)
RDW: 15.5 % — ABNORMAL HIGH (ref 11.5–14.5)
WBC: 7 10*3/uL (ref 3.6–11.0)

## 2017-05-18 MED ORDER — ASPIRIN EC 81 MG PO TBEC: 81.0000 mg | DELAYED_RELEASE_TABLET | Freq: Every day | ORAL | 2 refills | Status: AC

## 2017-05-18 MED ORDER — PANTOPRAZOLE SODIUM 40 MG PO TBEC: 40.0000 mg | DELAYED_RELEASE_TABLET | Freq: Two times a day (BID) | ORAL | Status: DC

## 2017-05-18 MED ORDER — ENSURE ENLIVE PO LIQD: 237.0000 mL | Freq: Two times a day (BID) | ORAL | 12 refills | Status: DC

## 2017-05-18 MED ORDER — PANTOPRAZOLE SODIUM 40 MG PO TBEC: 40.0000 mg | DELAYED_RELEASE_TABLET | Freq: Two times a day (BID) | ORAL | 1 refills | Status: DC

## 2017-05-18 MED ORDER — ACETAMINOPHEN 325 MG PO TABS: 650.0000 mg | ORAL_TABLET | Freq: Four times a day (QID) | ORAL | Status: AC | PRN

## 2017-05-18 MED ORDER — NICOTINE 21 MG/24HR TD PT24: 21.0000 mg | MEDICATED_PATCH | Freq: Every day | TRANSDERMAL | 0 refills | Status: DC

## 2017-05-18 MED ORDER — DOCUSATE SODIUM 100 MG PO CAPS: 100.0000 mg | ORAL_CAPSULE | Freq: Two times a day (BID) | ORAL | 0 refills | Status: DC | PRN

## 2017-05-18 NOTE — Discharge Instructions (Signed)
Follow-up with primary care physician in 1 week Follow-up with gastroenterology in 3-4 weeks Stop taking NSAIDs

## 2017-05-18 NOTE — Discharge Summary (Addendum)
[facility] at [place] NAME: Tina [name]    MR#:  [id number]  DATE OF BIRTH:  11-15-1927  DATE OF ADMISSION:  05/15/2017 ADMITTING PHYSICIAN: Idelle Crouch, MD  DATE OF DISCHARGE: 05/18/17  PRIMARY CARE PHYSICIAN: Derinda Late, MD    ADMISSION DIAGNOSIS:  Altered mental status, unspecified altered mental status type [R41.82] Gastrointestinal hemorrhage, unspecified gastrointestinal hemorrhage type [K92.2]  DISCHARGE DIAGNOSIS:  Principal Problem:   GI bleed Active Problems:   Acute blood loss anemia   Acute encephalopathy   Tachycardia   SECONDARY DIAGNOSIS:   Past Medical History:  Diagnosis Date  . Anxiety   . Cancer ([place])    breast  . Dementia   . Hypothyroidism   . Shingles     HOSPITAL COURSE:   HPI: Tina [name] is a 83 y.o. female has a past medical history significant for breast cancer, dementia, thyroid disease, and anxiety brought to ER by EMS with acute confusion with black, tarry stools. In ER, pt's hgb was lower than baseline with melena and guaiac positive stools. Pt is confused and unable to provide hx. She is now admitted. Per her son, she was "fine" last night.  #Upper GI bleed with melena-probably from NSAID use Clinically improving, advanced diet as tolerated, patient tolerated well Monitored hemoglobin hematocrit and transfuse as needed Hemoglobin 11.2-10.4-12.1--11.4 GI recommends to stop NSAIDs and Protonix outpatient follow-up   #Acute blood loss anemia from GI bleed Received blood transfusion,  #Subjective fever, leukocytosis-could be inflammatory afebrile afterwards, WBC is trending down and back to normal now, clinically at her baseline according to the children Patient is on empiric Zosyn unclear etiology, will d/c she spiked fevers only one time  #Acute encephalopathy with history of dementia Clinically better and at her baseline  empiric IV antibiotic Zosyn  discontinued  #Hypothyroidism Continue Synthyroid  #History of breast cancer Needs outpatient surveillance    DISCHARGE CONDITIONS:   sta ble  CONSULTS OBTAINED:  Treatment Team:  Virgel Manifold, MD   PROCEDURES  None   DRUG ALLERGIES:  No Known Allergies  DISCHARGE MEDICATIONS:   Current Discharge Medication List    START taking these medications   Details  acetaminophen (TYLENOL) 325 MG tablet Take 2 tablets (650 mg total) by mouth every 6 (six) hours as needed for mild pain (or Fever >/= 101).    aspirin EC 81 MG tablet Take 1 tablet (81 mg total) by mouth daily. Qty: 150 tablet, Refills: 2    docusate sodium (COLACE) 100 MG capsule Take 1 capsule (100 mg total) by mouth 2 (two) times daily as needed for mild constipation. Qty: 10 capsule, Refills: 0    feeding supplement, ENSURE ENLIVE, (ENSURE ENLIVE) LIQD Take 237 mLs by mouth 2 (two) times daily between meals. Qty: 60 Bottle, Refills: 12    nicotine (NICODERM CQ - DOSED IN MG/24 HOURS) 21 mg/24hr patch Place 1 patch (21 mg total) onto the skin daily. Qty: 28 patch, Refills: 0      CONTINUE these medications which have NOT CHANGED   Details  alendronate (FOSAMAX) 70 MG tablet Take 1 tablet once a week by mouth. Refills: 3    Calcium 500-100 MG-UNIT CHEW Chew 1 tablet 2 (two) times daily by mouth.    citalopram (CELEXA) 10 MG tablet Take 10 mg by mouth daily.    donepezil (ARICEPT) 10 MG tablet Take 10 mg by mouth every morning.    gabapentin (NEURONTIN) 300 MG  capsule Take 600 mg 3 (three) times daily by mouth.     levothyroxine (SYNTHROID, LEVOTHROID) 100 MCG tablet Take 100 mcg daily before breakfast by mouth.     Multiple Vitamin (MULTIVITAMIN) tablet Take 1 tablet by mouth daily.    fluticasone (FLONASE) 50 MCG/ACT nasal spray Place 2 sprays into both nostrils daily.      STOP taking these medications     aspirin 81 MG tablet      meloxicam (MOBIC) 15 MG tablet           DISCHARGE INSTRUCTIONS:   Follow-up with primary care physician in 1 week Follow-up with gastroenterology in 3-4 weeks Stop taking NSAIDs PCP to consider repeating CBC in a week during follow-up visit in view of thrombocytopenia  DIET:  Regular diet  DISCHARGE CONDITION:  Stable  ACTIVITY:  Activity as tolerated  OXYGEN:  Home Oxygen: No.   Oxygen Delivery: room air  DISCHARGE LOCATION:  home   If you experience worsening of your admission symptoms, develop shortness of breath, life threatening emergency, suicidal or homicidal thoughts you must seek medical attention immediately by calling 911 or calling your MD immediately  if symptoms less severe.  You Must read complete instructions/literature along with all the possible adverse reactions/side effects for all the Medicines you take and that have been prescribed to you. Take any new Medicines after you have completely understood and accpet all the possible adverse reactions/side effects.   Please note  You were cared for by a hospitalist during your hospital stay. If you have any questions about your discharge medications or the care you received while you were in the hospital after you are discharged, you can call the unit and asked to speak with the hospitalist on call if the hospitalist that took care of you is not available. Once you are discharged, your primary care physician will handle any further medical issues. Please note that NO REFILLS for any discharge medications will be authorized once you are discharged, as it is imperative that you return to your primary care physician (or establish a relationship with a primary care physician if you do not have one) for your aftercare needs so that they can reassess your need for medications and monitor your lab values.     Today  Chief Complaint  Patient presents with  . Altered Mental Status   Patient is feeling much better.  Out of bed ambulating to the bathroom  with walker According to the daughter she is almost at her baseline, no other episodes of bleeding.  Wants to go home with home health  ROS:  CONSTITUTIONAL: Denies fevers, chills. Denies any fatigue, weakness.  EYES: Denies blurry vision, double vision, eye pain. EARS, NOSE, THROAT: Denies tinnitus, ear pain, hearing loss. RESPIRATORY: Denies cough, wheeze, shortness of breath.  CARDIOVASCULAR: Denies chest pain, palpitations, edema.  GASTROINTESTINAL: Denies nausea, vomiting, diarrhea, abdominal pain. Denies bright red blood per rectum. GENITOURINARY: Denies dysuria, hematuria. ENDOCRINE: Denies nocturia or thyroid problems. HEMATOLOGIC AND LYMPHATIC: Denies easy bruising or bleeding. SKIN: Denies rash or lesion. MUSCULOSKELETAL: Denies pain in neck, back, shoulder, knees, hips or arthritic symptoms.  NEUROLOGIC: Denies paralysis, paresthesias.  PSYCHIATRIC: Denies anxiety or depressive symptoms.   VITAL SIGNS:  Blood pressure (!) 112/49, pulse 77, temperature 98.7 F (37.1 C), temperature source Oral, resp. rate 18, height 5' (1.524 m), weight 72.8 kg (160 lb 8 oz), SpO2 97 %.  I/O:    Intake/Output Summary (Last 24 hours) at 05/18/2017 1030 Last  data filed at 05/18/2017 0400 Gross per 24 hour  Intake 3985 ml  Output 200 ml  Net 3785 ml    PHYSICAL EXAMINATION:  GENERAL:  83 y.o.-year-old patient lying in the bed with no acute distress.  EYES: Pupils equal, round, reactive to light and accommodation. No scleral icterus. Extraocular muscles intact.  HEENT: Head atraumatic, normocephalic. Oropharynx and nasopharynx clear.  NECK:  Supple, no jugular venous distention. No thyroid enlargement, no tenderness.  LUNGS: Normal breath sounds bilaterally, no wheezing, rales,rhonchi or crepitation. No use of accessory muscles of respiration.  CARDIOVASCULAR: S1, S2 normal. No murmurs, rubs, or gallops.  ABDOMEN: Soft, non-tender, non-distended. Bowel sounds present. No organomegaly or  mass.  EXTREMITIES: No pedal edema, cyanosis, or clubbing.  NEUROLOGIC: Cranial nerves II through XII are intact. Muscle strength 5/5 in all extremities. Sensation intact. Gait not checked.  PSYCHIATRIC: The patient is alert and oriented x 3.  SKIN: No obvious rash, lesion, or ulcer.   DATA REVIEW:   CBC Recent Labs  Lab 05/18/17 0642  WBC 7.0  HGB 11.4*  HCT 33.7*  PLT 89*    Chemistries  Recent Labs  Lab 05/16/17 1023  NA 146*  K 3.6  CL 119*  CO2 22  GLUCOSE 96  BUN 29*  CREATININE 0.69  CALCIUM 7.8*  AST 36  ALT 19  ALKPHOS 134*  BILITOT 0.8    Cardiac Enzymes Recent Labs  Lab 05/15/17 1009  TROPONINI <0.03    Microbiology Results  Results for orders placed or performed during the hospital encounter of 05/15/17  MRSA PCR Screening     Status: None   Collection Time: 05/16/17  5:52 AM  Result Value Ref Range Status   MRSA by PCR NEGATIVE NEGATIVE Final    Comment:        The GeneXpert MRSA Assay (FDA approved for NASAL specimens only), is one component of a comprehensive MRSA colonization surveillance program. It is not intended to diagnose MRSA infection nor to guide or monitor treatment for MRSA infections.     RADIOLOGY:  Dg Chest 1 View  Result Date: 05/15/2017 CLINICAL DATA:  Confusion. EXAM: CHEST 1 VIEW COMPARISON:  None. FINDINGS: The heart size and mediastinal contours are within normal limits. Both lungs are clear. The visualized skeletal structures are unremarkable. IMPRESSION: No active disease. Electronically Signed   By: Dorise Bullion III M.D   On: 05/15/2017 10:39   Ct Head Wo Contrast  Result Date: 05/15/2017 CLINICAL DATA:  Altered level of consciousness. EXAM: CT HEAD WITHOUT CONTRAST TECHNIQUE: Contiguous axial images were obtained from the base of the skull through the vertex without intravenous contrast. COMPARISON:  11/02/2016 FINDINGS: Brain: There is atrophy and chronic small vessel disease changes. No acute  intracranial abnormality. Specifically, no hemorrhage, hydrocephalus, mass lesion, acute infarction, or significant intracranial injury. Vascular: No hyperdense vessel or unexpected calcification. Skull: No acute calvarial abnormality. Sinuses/Orbits: Visualized paranasal sinuses and mastoids clear. Orbital soft tissues unremarkable. Other: None IMPRESSION: No acute intracranial abnormality. Atrophy, chronic microvascular disease. Electronically Signed   By: Rolm Baptise M.D.   On: 05/15/2017 10:54   Dg Hip Unilat With Pelvis 2-3 Views Right  Result Date: 05/15/2017 CLINICAL DATA:  Right-sided hip pain, initial encounter EXAM: DG HIP (WITH OR WITHOUT PELVIS) 3V RIGHT COMPARISON:  None. FINDINGS: Pelvic ring is intact. Mild degenerative changes of the hip joints are noted. No acute fracture or dislocation is seen. No soft tissue abnormality is noted. IMPRESSION: Degenerative change without acute abnormality.  Electronically Signed   By: Inez Catalina M.D.   On: 05/15/2017 20:44    EKG:   Orders placed or performed during the hospital encounter of 05/15/17  . ED EKG  . ED EKG  . EKG 12-Lead  . EKG 12-Lead      Management plans discussed with the patient, family and they are in agreement.  CODE STATUS:     Code Status Orders  (From admission, onward)        Start     Ordered   05/15/17 1451  Full code  Continuous     05/15/17 1451    Code Status History    Date Active Date Inactive Code Status Order ID Comments User Context   06/27/2015 15:57 06/27/2015 19:32 Full Code [id number]  Hessie Knows, MD Inpatient    Advance Directive Documentation     Most Recent Value  Type of Advance Directive  Healthcare Power of Attorney  Pre-existing out of facility DNR order (yellow form or pink MOST form)  No data  "MOST" Form in Place?  No data      TOTAL TIME TAKING CARE OF THIS PATIENT: 45  minutes.   Note: This dictation was prepared with Dragon dictation along with smaller phrase  technology. Any transcriptional errors that result from this process are unintentional.   @M[facility] @  on 05/18/2017 at 10:30 AM  Between 7am to 6pm - Pager - [phone]  After 6pm go to [email or web address] - password [email or web address] [facility]  [place] [facility]  Office  [phone]  CC: Primary care physician; Derinda Late, MD

## 2017-05-18 NOTE — Care Management Note (Signed)
Case Management Note  Patient Details  Name: Tina [name] MRN: [id number] Date of Birth: 02-28-1928  Subjective/Objective: RNCM assessment for discharge planning. Met with patient, son and daughter at bedside. Pt recommending HHPT. Offerd choice and son prefers [facility]. Referral to [facility] for HH[facility]. No DME needs. Patient lives at home alone but is independent with adls. Her son lives in [place] and checks on her frequently. He spends the weekends with her and daughter will be staying with her for the next week.PCP is Dr. Loney Hering. Last seen approximately 2 months ago. Patient will be discharge home by car.                    Action/Plan:   Expected Discharge Date:  05/18/17               Expected Discharge Plan:  [place]  In-House Referral:     Discharge planning Services  CM Consult  Post Acute Care Choice:  Home Health Choice offered to:  Adult Children  DME Arranged:    DME Agency:     HH Arranged:  PT HH Agency:  [facility]  Status of Service:  Completed, signed off  If discussed at [place] of Stay Meetings, dates discussed:    Additional Comments:  Jolly Mango, RN 05/18/2017, 10:06 AM

## 2017-05-18 NOTE — Progress Notes (Signed)
Patient alert and oriented, vss, no complaints of pain.  Patient to be escorted out of hospital via wheelchair by volunteers.  Son and Daughter present at discharge, no questions.  D/C telemtry and PIV.

## 2017-05-24 DIAGNOSIS — G309 Alzheimer's disease, unspecified: Secondary | ICD-10-CM

## 2017-05-24 DIAGNOSIS — F0280 Dementia in other diseases classified elsewhere without behavioral disturbance: Secondary | ICD-10-CM | POA: Insufficient documentation

## 2017-06-23 ENCOUNTER — Ambulatory Visit: Payer: Medicare Other | Admitting: Gastroenterology

## 2017-06-23 ENCOUNTER — Other Ambulatory Visit: Payer: Self-pay | Admitting: Gastroenterology

## 2017-06-23 ENCOUNTER — Other Ambulatory Visit
Admission: RE | Admit: 2017-06-23 | Discharge: 2017-06-23 | Disposition: A | Discharge status: Home / Self Care | Payer: Medicare Other | Source: Ambulatory Visit | Attending: Gastroenterology | Admitting: Gastroenterology

## 2017-06-23 ENCOUNTER — Ambulatory Visit (INDEPENDENT_AMBULATORY_CARE_PROVIDER_SITE_OTHER): Payer: Medicare Other | Admitting: Gastroenterology

## 2017-06-23 ENCOUNTER — Encounter (INDEPENDENT_AMBULATORY_CARE_PROVIDER_SITE_OTHER): Payer: Self-pay

## 2017-06-23 ENCOUNTER — Other Ambulatory Visit: Payer: Self-pay

## 2017-06-23 ENCOUNTER — Encounter: Payer: Self-pay | Admitting: Gastroenterology

## 2017-06-23 VITALS — BP 114/63 | HR 94 | Ht 60.0 in | Wt 149.2 lb

## 2017-06-23 DIAGNOSIS — F1990 Other psychoactive substance use, unspecified, uncomplicated: Secondary | ICD-10-CM

## 2017-06-23 DIAGNOSIS — K921 Melena: Secondary | ICD-10-CM | POA: Diagnosis not present

## 2017-06-23 DIAGNOSIS — D62 Acute posthemorrhagic anemia: Secondary | ICD-10-CM | POA: Diagnosis not present

## 2017-06-23 DIAGNOSIS — R748 Abnormal levels of other serum enzymes: Secondary | ICD-10-CM

## 2017-06-23 LAB — CBC WITH DIFFERENTIAL/PLATELET
BASOS ABS: 0.1 10*3/uL (ref 0–0.1)
BASOS PCT: 1 %
Eosinophils Absolute: 0.3 10*3/uL (ref 0–0.7)
Eosinophils Relative: 4 %
HEMATOCRIT: 38.3 % (ref 35.0–47.0)
Hemoglobin: 12.6 g/dL (ref 12.0–16.0)
LYMPHS PCT: 22 %
Lymphs Abs: 1.8 10*3/uL (ref 1.0–3.6)
MCH: 30.1 pg (ref 26.0–34.0)
MCHC: 33 g/dL (ref 32.0–36.0)
MCV: 91.3 fL (ref 80.0–100.0)
Monocytes Absolute: 1.2 10*3/uL — ABNORMAL HIGH (ref 0.2–0.9)
Monocytes Relative: 15 %
NEUTROS ABS: 4.6 10*3/uL (ref 1.4–6.5)
NEUTROS PCT: 58 %
Platelets: 144 10*3/uL — ABNORMAL LOW (ref 150–440)
RBC: 4.2 MIL/uL (ref 3.80–5.20)
RDW: 15.9 % — AB (ref 11.5–14.5)
WBC: 7.9 10*3/uL (ref 3.6–11.0)

## 2017-06-23 LAB — COMPREHENSIVE METABOLIC PANEL
ALBUMIN: 3.3 g/dL — AB (ref 3.5–5.0)
ALK PHOS: 303 U/L — AB (ref 38–126)
ALT: 39 U/L (ref 14–54)
ANION GAP: 6 (ref 5–15)
AST: 88 U/L — ABNORMAL HIGH (ref 15–41)
BILIRUBIN TOTAL: 0.8 mg/dL (ref 0.3–1.2)
BUN: 9 mg/dL (ref 6–20)
CALCIUM: 9.2 mg/dL (ref 8.9–10.3)
CO2: 24 mmol/L (ref 22–32)
Chloride: 108 mmol/L (ref 101–111)
Creatinine, Ser: 0.88 mg/dL (ref 0.44–1.00)
GFR, EST NON AFRICAN AMERICAN: 57 mL/min — AB (ref >60)
Glucose, Bld: 120 mg/dL — ABNORMAL HIGH (ref 65–99)
POTASSIUM: 4.5 mmol/L (ref 3.5–5.1)
Sodium: 138 mmol/L (ref 135–145)
TOTAL PROTEIN: 7.4 g/dL (ref 6.5–8.1)

## 2017-06-23 LAB — GAMMA GT: GGT: 225 U/L — ABNORMAL HIGH (ref 7–50)

## 2017-06-23 MED ORDER — PANTOPRAZOLE SODIUM 40 MG PO TBEC: 40.0000 mg | DELAYED_RELEASE_TABLET | Freq: Two times a day (BID) | ORAL | 2 refills | Status: DC

## 2017-06-23 NOTE — Patient Instructions (Signed)
F/U 2 months Continue Protonix x2 more months NO NSAIDS LABS: CBC, CMP

## 2017-06-23 NOTE — Progress Notes (Signed)
Tina Antigua, MD [address]  [place]  [place], [place] [address]  Main: [phone]  Fax: [phone]   Primary Care Physician: Derinda Late, MD  Primary Gastroenterologist:  Dr. Vonda [name]  Chief Complaint  Patient presents with  . Follow-up    1 month-GI BLEED    HPI: Tina [name] is a 83 y.o. female here for hospital follow-up for melena.  Patient was taking meloxicam at home prior to the admission was found to have melanotic stool at home by her family.  She was also confused on presentation to the hospital which improved after conservative management.  Patient was not on any anticoagulants at home, and since GI bleeding resolved with PPI, EGD was considered to be high risks and benefits and patient was managed conservatively and improved clinically.  Lowest hemoglobin level during her admission was 10.4.  She has not taken any NSAIDs since discharge from the hospital.  Current Outpatient Medications  Medication Sig Dispense Refill  . acetaminophen (TYLENOL) 325 MG tablet Take 2 tablets (650 mg total) by mouth every 6 (six) hours as needed for mild pain (or Fever >/= 101).    Marland Kitchen alendronate (FOSAMAX) 70 MG tablet Take 1 tablet once a week by mouth.  3  . aspirin EC 81 MG tablet Take 1 tablet (81 mg total) by mouth daily. 150 tablet 2  . Calcium 500-100 MG-UNIT CHEW Chew 1 tablet 2 (two) times daily by mouth.    . citalopram (CELEXA) 10 MG tablet Take 10 mg by mouth daily.    Marland Kitchen docusate sodium (COLACE) 100 MG capsule Take 1 capsule (100 mg total) by mouth 2 (two) times daily as needed for mild constipation. 10 capsule 0  . donepezil (ARICEPT) 10 MG tablet Take 10 mg by mouth every morning.    . feeding supplement, ENSURE ENLIVE, (ENSURE ENLIVE) LIQD Take 237 mLs by mouth 2 (two) times daily between meals. 60 Bottle 12  . fluticasone (FLONASE) 50 MCG/ACT nasal spray Place 2 sprays into both nostrils daily.    Marland Kitchen gabapentin (NEURONTIN) 300 MG capsule Take  600 mg 3 (three) times daily by mouth.     . levothyroxine (SYNTHROID, LEVOTHROID) 100 MCG tablet Take 100 mcg daily before breakfast by mouth.     . Multiple Vitamin (MULTIVITAMIN) tablet Take 1 tablet by mouth daily.    . pantoprazole (PROTONIX) 40 MG tablet Take 1 tablet (40 mg total) by mouth 2 (two) times daily. 60 tablet 1  . nicotine (NICODERM CQ - DOSED IN MG/24 HOURS) 21 mg/24hr patch Place 1 patch (21 mg total) onto the skin daily. (Patient not taking: Reported on 06/23/2017) 28 patch 0   No current facility-administered medications for this visit.     Allergies as of 06/23/2017 - Review Complete 06/23/2017  Allergen Reaction Noted  . Nsaids Other (See Comments) 06/23/2017  . Meloxicam  05/24/2017    ROS:  General: Negative for anorexia, weight loss, fever, chills, fatigue, weakness. ENT: Negative for hoarseness, difficulty swallowing , nasal congestion. CV: Negative for chest pain, angina, palpitations, dyspnea on exertion, peripheral edema.  Respiratory: Negative for dyspnea at rest, dyspnea on exertion, cough, sputum, wheezing.  GI: See history of present illness. GU:  Negative for dysuria, hematuria, urinary incontinence, urinary frequency, nocturnal urination.  Endo: Negative for unusual weight change.    Physical Examination:   BP 114/63   Pulse 94   Ht 5' (1.524 m)   Wt 149 lb 3.2 oz (67.7 kg)  BMI 29.14 kg/m   General: Well-nourished, well-developed in no acute distress.  Eyes: No icterus. Conjunctivae pink. Mouth: Oropharyngeal mucosa moist and pink , no lesions erythema or exudate. Lungs: Clear to auscultation bilaterally. Non-labored. Heart: Regular rate and rhythm, no murmurs rubs or gallops.  Abdomen: Bowel sounds are normal, nontender, nondistended, no hepatosplenomegaly or masses, no abdominal bruits or hernia , no rebound or guarding.   Extremities: No lower extremity edema. No clubbing or deformities. Neuro: Alert and oriented x 3.  Grossly  intact. Skin: Warm and dry, no jaundice.   Psych: Alert and cooperative, normal mood and affect.   Labs: CMP     Component Value Date/Time   NA 146 (H) 05/16/2017 1023   K 3.6 05/16/2017 1023   CL 119 (H) 05/16/2017 1023   CO2 22 05/16/2017 1023   GLUCOSE 96 05/16/2017 1023   BUN 29 (H) 05/16/2017 1023   CREATININE 0.69 05/16/2017 1023   CALCIUM 7.8 (L) 05/16/2017 1023   PROT 5.6 (L) 05/16/2017 1023   ALBUMIN 2.7 (L) 05/16/2017 1023   AST 36 05/16/2017 1023   ALT 19 05/16/2017 1023   ALKPHOS 134 (H) 05/16/2017 1023   BILITOT 0.8 05/16/2017 1023   GFRNONAA >60 05/16/2017 1023   GFRAA >60 05/16/2017 1023   Lab Results  Component Value Date   WBC 7.0 05/18/2017   HGB 11.4 (L) 05/18/2017   HCT 33.7 (L) 05/18/2017   MCV 90.1 05/18/2017   PLT 89 (L) 05/18/2017    Imaging Studies: No results found.  Assessment and Plan:   [name] is a 83 y.o. y/o female here for hospital follow-up for melena  Patient's GI bleed resolved with conservative management with IV PPI She is having regular brown bowel movements with no bright blood per rectum or melena Since she is not requiring any anticoagulation, and no signs of active GI bleeding, endoscopic procedures are higher risks and benefits in this elderly patient. We will continue PPI twice daily for total of 12 weeks (2 more months) and if no further signs of anemia or bleeding, we can stop the medication at that point. We will repeat blood work at this time as well Patient asked to contact us with any alarm symptoms including abdominal pain, blood in stool, dizziness, nausea vomiting, or any other reason for concern. Patient and family encouraged to not take NSAIDs again, and they verbalized understanding.  They state that her PCP has also seen her since the discharge is in agreement with this plan.  Dr Tina [name]

## 2017-06-24 ENCOUNTER — Other Ambulatory Visit: Payer: Self-pay | Admitting: Gastroenterology

## 2017-06-24 DIAGNOSIS — R748 Abnormal levels of other serum enzymes: Secondary | ICD-10-CM

## 2017-06-25 ENCOUNTER — Telehealth: Payer: Self-pay

## 2017-06-25 NOTE — Telephone Encounter (Signed)
-----  Message from Virgel Manifold, MD sent at 06/24/2017  9:11 AM EST ----- Jackelyn Poling, can you let the patient/her son know that she does not have any anemia on her bloodwork. One of her liver enzyme is elevated, which can happen with her chronic osteoporosis. She will need a RUQ U/S that I have ordered. There is a blood test called Bone Alkaline Phosphotase. I tried to search for it in our system and it only came up under the procedures tab. I have pended that order. Can you see if this is something we can order by talking to lab and complete my order. If not, we will start with the RUQ U/S.   (For charting purposes only: Pt. Has no signs or symptoms of biliary obstruction as she did not have any abdominal pain during her visit, and her Bilirubin is normal. Her elevated Alk Phos is thus likely related to her osteoporosis as elevated AlkPhos can be seen in elderly patients with osteoporosis. Will refer to her PCP for the elevated Alk Phos if RUQ U/S doe snot show any specific etiology)

## 2017-06-25 NOTE — Telephone Encounter (Signed)
Left message for son [name]) to contact office.

## 2017-06-28 NOTE — Telephone Encounter (Signed)
Please call patients daughter 

## 2017-06-28 NOTE — Telephone Encounter (Signed)
I spoke with pt's daughter Levander Campion), who will be taking pt to have blood work done and for her [place]. Due to her schedule she will not be here until Jan. 23rd. Pt has her bone density on the 24th. This was scheduled for Jan. 7, at 8:45 am (pt to be fasting 6-8 hrs prior to [place]) and was scheduled at outpatient Imaging on [place]. Daughter given phone number for outpatient Imaging [phone]) to reschedule [place] appt. Also aware of blood work that needs to be done for Bone Alkaline Phosphatase that is written on a prescription and will need to go with her when lab is drawn.  Pt's ultrasound rescheduled for 07/23/2017.

## 2017-07-05 ENCOUNTER — Ambulatory Visit: Payer: Medicare Other

## 2017-07-23 ENCOUNTER — Ambulatory Visit
Admission: RE | Admit: 2017-07-23 | Discharge: 2017-07-23 | Disposition: A | Discharge status: Home / Self Care | Payer: Medicare Other | Source: Ambulatory Visit | Attending: Gastroenterology | Admitting: Gastroenterology

## 2017-07-23 DIAGNOSIS — R748 Abnormal levels of other serum enzymes: Secondary | ICD-10-CM | POA: Insufficient documentation

## 2017-07-23 DIAGNOSIS — K8020 Calculus of gallbladder without cholecystitis without obstruction: Secondary | ICD-10-CM | POA: Diagnosis not present

## 2017-07-26 ENCOUNTER — Other Ambulatory Visit: Payer: Self-pay

## 2017-07-26 ENCOUNTER — Telehealth: Payer: Self-pay

## 2017-07-26 DIAGNOSIS — R748 Abnormal levels of other serum enzymes: Secondary | ICD-10-CM

## 2017-07-26 NOTE — Telephone Encounter (Signed)
Pt's son, [name] returned call and was advised of [place] and labs. [name] stated his mother has a follow up appt with her PCP on Friday. He will take her for the new labs after that appt.

## 2017-07-26 NOTE — Telephone Encounter (Signed)
LVM for pt to return my call.

## 2017-07-26 NOTE — Telephone Encounter (Signed)
-----   Message from Virgel Manifold, MD sent at 07/23/2017  3:08 PM EST ----- Tina [name] please let Tina [name] know,  1. Tina [name] ultrasound did not show any biliary obstruction or dilation.  Tina [name] liver looks normal.  A tiny gallstone was seen in Tina [name] gallbladder, which does not need further intervention as these can remain asymptomatic for a long time. 2. However, we need to repeat Tina [name] lab work to see if Tina [name] liver enzymes have improved from a month ago.  I have ordered these tests in Tina [name] chart. 3.  Please ask, if the Tina [name] has been taking multiple doses of Tylenol a day.  If she has, she should stop this until we get Tina [name] lab work back to see if Tina [name] liver enzymes are okay. 4.  She should also follow up with Tina [name] primary care doctor in regard to Tina [name] elevated alkaline phosphatase.  It might be elevated due to Tina [name] osteoporosis.  Please forward my last result note (the results, and to Tina [name] last labs), and this results note, to Tina [name] primary care provider.  He is listed under care everywhere

## 2017-07-30 ENCOUNTER — Ambulatory Visit
Admission: RE | Admit: 2017-07-30 | Discharge: 2017-07-30 | Disposition: A | Discharge status: Home / Self Care | Payer: Medicare Other | Source: Ambulatory Visit | Attending: Gastroenterology | Admitting: Gastroenterology

## 2017-07-30 ENCOUNTER — Other Ambulatory Visit
Admission: RE | Admit: 2017-07-30 | Discharge: 2017-07-30 | Disposition: A | Discharge status: Home / Self Care | Payer: Medicare Other | Source: Ambulatory Visit | Attending: Gastroenterology | Admitting: Gastroenterology

## 2017-07-30 DIAGNOSIS — R748 Abnormal levels of other serum enzymes: Secondary | ICD-10-CM | POA: Diagnosis present

## 2017-07-30 LAB — COMPREHENSIVE METABOLIC PANEL
ALBUMIN: 3.6 g/dL (ref 3.5–5.0)
ALT: 24 U/L (ref 14–54)
ANION GAP: 10 (ref 5–15)
AST: 48 U/L — AB (ref 15–41)
Alkaline Phosphatase: 162 U/L — ABNORMAL HIGH (ref 38–126)
BUN: 10 mg/dL (ref 6–20)
CHLORIDE: 103 mmol/L (ref 101–111)
CO2: 24 mmol/L (ref 22–32)
Calcium: 9.8 mg/dL (ref 8.9–10.3)
Creatinine, Ser: 0.87 mg/dL (ref 0.44–1.00)
GFR calc Af Amer: >60 mL/min (ref >60)
GFR calc non Af Amer: 58 mL/min — ABNORMAL LOW (ref >60)
GLUCOSE: 93 mg/dL (ref 65–99)
POTASSIUM: 4.1 mmol/L (ref 3.5–5.1)
SODIUM: 137 mmol/L (ref 135–145)
TOTAL PROTEIN: 7.5 g/dL (ref 6.5–8.1)
Total Bilirubin: 0.6 mg/dL (ref 0.3–1.2)

## 2017-07-30 LAB — FERRITIN: FERRITIN: 21 ng/mL (ref 11–307)

## 2017-07-31 LAB — CERULOPLASMIN: CERULOPLASMIN: 17.1 mg/dL — AB (ref 19.0–39.0)

## 2017-07-31 LAB — HEPATITIS B SURFACE ANTIGEN: Hepatitis B Surface Ag: NEGATIVE

## 2017-07-31 LAB — HEPATITIS B CORE ANTIBODY, TOTAL: HEP B C TOTAL AB: NEGATIVE

## 2017-07-31 LAB — HEPATITIS C ANTIBODY

## 2017-07-31 LAB — HEPATITIS A ANTIBODY, TOTAL: Hep A Total Ab: POSITIVE — AB

## 2017-07-31 LAB — HEPATITIS A ANTIBODY, IGM: Hep A IgM: NEGATIVE

## 2017-07-31 LAB — HEPATITIS B SURFACE ANTIBODY, QUANTITATIVE: Hepatitis B-Post: <3.1 m[IU]/mL — ABNORMAL LOW (ref >9.9)

## 2017-07-31 LAB — HEPATITIS B CORE ANTIBODY, IGM: HEP B C IGM: NEGATIVE

## 2017-08-01 LAB — MITOCHONDRIAL ANTIBODIES: Mitochondrial M2 Ab, IgG: 93.4 Units — ABNORMAL HIGH (ref 0.0–20.0)

## 2017-08-01 LAB — ANTI-SMOOTH MUSCLE ANTIBODY, IGG: F-Actin IgG: 16 Units (ref 0–19)

## 2017-08-02 ENCOUNTER — Other Ambulatory Visit: Payer: Self-pay | Admitting: Gastroenterology

## 2017-08-02 DIAGNOSIS — E559 Vitamin D deficiency, unspecified: Secondary | ICD-10-CM

## 2017-08-02 DIAGNOSIS — K743 Primary biliary cirrhosis: Secondary | ICD-10-CM

## 2017-08-02 DIAGNOSIS — K9049 Malabsorption due to intolerance, not elsewhere classified: Secondary | ICD-10-CM

## 2017-08-02 LAB — ALKALINE PHOSPHATASE, ISOENZYMES
ALK PHOS BONE FRACT: 20 % (ref 14–68)
ALK PHOS LIVER FRACT: 80 % (ref 18–85)
Alk Phos: 181 IU/L — ABNORMAL HIGH (ref 39–117)
INTESTINAL %: 0 % (ref 0–18)

## 2017-08-02 LAB — ANTINUCLEAR ANTIBODIES, IFA: ANA Ab, IFA: NEGATIVE

## 2017-08-02 MED ORDER — URSODIOL 250 MG PO TABS: 250.0000 mg | ORAL_TABLET | Freq: Three times a day (TID) | ORAL | 3 refills | Status: DC

## 2017-08-02 NOTE — Addendum Note (Signed)
Addended by: Vonda Antigua on: 08/02/2017 12:46 PM   Modules accepted: Orders

## 2017-08-03 ENCOUNTER — Telehealth: Payer: Self-pay

## 2017-08-03 NOTE — Telephone Encounter (Signed)
Advised patient of results per Dr. Bonna Gains -    - let patient know, I would like to see her in clinic in 3-4 weeks.   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 This is an autoimmune phenomenon affecting the bile ducts.  It can be managed with ursodiol, which I have prescribed and sent to her pharmacy. She should take this with food.  It is not causing any signs of liver failure in her at this time . I have ordered more testing, as it can cause vitamin abnormalities and cholesterol abnormalities.  Please ask her to get this done. (I would also like to do fiberosure testing on her, I have pended this, but it does not have a resulting agency. Please find out if this can be done at the hospital, if not we might need to send her to [facility] or [facility] to get all her labs done)   I would like to discuss it in further detail in clinic.   Tina [name] to schedule office visit and call patient back.

## 2017-08-06 NOTE — Progress Notes (Signed)
Lmom to call back to the office

## 2017-08-10 NOTE — Progress Notes (Signed)
lmom for call back 

## 2017-08-11 NOTE — Addendum Note (Signed)
Addended by: Vonda Antigua on: 08/11/2017 03:54 PM   Modules accepted: Orders

## 2017-08-12 ENCOUNTER — Other Ambulatory Visit: Payer: Self-pay

## 2017-08-12 ENCOUNTER — Telehealth: Payer: Self-pay

## 2017-08-12 DIAGNOSIS — E559 Vitamin D deficiency, unspecified: Secondary | ICD-10-CM

## 2017-08-12 DIAGNOSIS — K9049 Malabsorption due to intolerance, not elsewhere classified: Secondary | ICD-10-CM

## 2017-08-12 DIAGNOSIS — K743 Primary biliary cirrhosis: Secondary | ICD-10-CM

## 2017-08-12 NOTE — Addendum Note (Signed)
Addended by: Earl Lagos on: 08/12/2017 02:26 PM   Modules accepted: Orders

## 2017-08-12 NOTE — Addendum Note (Signed)
Addended by: Earl Lagos on: 08/12/2017 02:27 PM   Modules accepted: Orders

## 2017-08-12 NOTE — Telephone Encounter (Addendum)
Left message for pt's son Tina [name] to contact office for f/u appt and labs. Son notified and another call placed to let him know, labs will be drawn at [facility] because [facility] does not have lab on list. Left detailed message and to call office if needed.

## 2017-08-26 ENCOUNTER — Telehealth: Payer: Self-pay | Admitting: Gastroenterology

## 2017-08-26 ENCOUNTER — Ambulatory Visit: Payer: Medicare Other | Admitting: Gastroenterology

## 2017-08-26 NOTE — Telephone Encounter (Signed)
Called patient to reschedule her NO SHOW. She stated she will call us back to reschedule but would like results. Please call

## 2017-09-07 NOTE — Telephone Encounter (Signed)
I spoke with pt's daughter Tina [name]) and she asked that I call Tina [name] (son) about pt's request for results of which there are no new results. Tina [name] had taken care of this. I spoke with Tina [name] and informed him that pt missed her f/u appt on 08/26/17 and has not done her lab work either ordered on 08/12/2017. Due to scheduling issues he will take his mother on 09/17/2017 for her labs and would like an email reminder to call us on 3/25 to make an appt for his mother. Email: [email or web address].  Son states it would be okay to call her and tell her that lab results were okay. (I did not want to confuse her (pt)).  Pt notified results were okay.

## 2017-09-21 ENCOUNTER — Telehealth: Payer: Self-pay

## 2017-09-21 NOTE — Telephone Encounter (Signed)
Sent email as requested per son [name], to remind him to contact office to make his mother her appt.

## 2017-09-24 LAB — NASH FIBROSURE
ALPHA 2-MACROGLOBULINS, QN: 520 mg/dL — ABNORMAL HIGH (ref 110–276)
ALT (SGPT) P5P: 12 IU/L (ref 0–40)
AST (SGOT) P5P: 39 IU/L (ref 0–40)
Apolipoprotein A-1: 173 mg/dL (ref 114–214)
BILIRUBIN, TOTAL: 0.1 mg/dL (ref 0.0–1.2)
CHOLESTEROL, TOTAL: 185 mg/dL (ref 100–199)
Fibrosis Score: 0.59 — ABNORMAL HIGH (ref 0.00–0.21)
GGT: 52 IU/L (ref 0–60)
GLUCOSE: 98 mg/dL (ref 65–99)
HEIGHT: 60 in
Haptoglobin: 79 mg/dL (ref 34–200)
NASH Score: 0.75 — ABNORMAL HIGH
STEATOSIS SCORE: 0.45 — AB (ref 0.00–0.30)
Triglycerides: 180 mg/dL — ABNORMAL HIGH (ref 0–149)
Weight: 148 [lb_av]

## 2017-09-24 LAB — VITAMIN K1, SERUM: VITAMIN K1: 0.2 ng/mL (ref 0.13–1.88)

## 2017-09-24 LAB — LIPID PANEL
CHOL/HDL RATIO: 3.5 ratio (ref 0.0–4.4)
Cholesterol, Total: 149 mg/dL (ref 100–199)
HDL: 42 mg/dL (ref >39)
LDL CALC: 78 mg/dL (ref 0–99)
Triglycerides: 147 mg/dL (ref 0–149)
VLDL Cholesterol Cal: 29 mg/dL (ref 5–40)

## 2017-09-24 LAB — VITAMIN E
Vitamin E (Alpha Tocopherol): 11.2 mg/L (ref 9.0–29.0)
Vitamin E(Gamma Tocopherol): 0.2 mg/L — ABNORMAL LOW (ref 0.5–4.9)

## 2017-09-24 LAB — VITAMIN D 1,25 DIHYDROXY
VITAMIN D 1, 25 (OH) TOTAL: 40 pg/mL
Vitamin D3 1, 25 (OH)2: 38 pg/mL

## 2017-09-24 LAB — VITAMIN A: Vitamin A: 29.8 ug/dL (ref 22.0–69.5)

## 2017-09-24 LAB — VITAMIN D 25 HYDROXY (VIT D DEFICIENCY, FRACTURES): Vit D, 25-Hydroxy: 40.3 ng/mL (ref 30.0–100.0)

## 2017-10-05 ENCOUNTER — Telehealth: Payer: Self-pay | Admitting: Gastroenterology

## 2017-10-05 NOTE — Telephone Encounter (Signed)
Called son to see how patient is doing. He stated she is fine and taking her medications.  He has a hard time making dr appt due to his work schedule. He would just like to keep the appt in June and will call us if needed.

## 2017-11-15 ENCOUNTER — Ambulatory Visit: Payer: Medicare Other | Admitting: Gastroenterology

## 2017-12-02 ENCOUNTER — Other Ambulatory Visit: Payer: Self-pay | Admitting: Gastroenterology

## 2017-12-02 DIAGNOSIS — K9049 Malabsorption due to intolerance, not elsewhere classified: Secondary | ICD-10-CM

## 2017-12-02 DIAGNOSIS — E559 Vitamin D deficiency, unspecified: Secondary | ICD-10-CM

## 2017-12-02 DIAGNOSIS — K743 Primary biliary cirrhosis: Secondary | ICD-10-CM

## 2017-12-06 ENCOUNTER — Ambulatory Visit: Payer: Medicare Other | Admitting: Gastroenterology

## 2017-12-27 ENCOUNTER — Ambulatory Visit: Payer: Medicare Other | Admitting: Gastroenterology

## 2017-12-27 ENCOUNTER — Encounter: Payer: Self-pay | Admitting: *Deleted

## 2018-03-23 ENCOUNTER — Emergency Department: Payer: Medicare Other

## 2018-03-23 ENCOUNTER — Inpatient Hospital Stay
Admission: EM | Admit: 2018-03-23 | Discharge: 2018-03-26 | DRG: 511 | Disposition: A | Discharge status: Skilled Nursing Facility | Payer: Medicare Other | Attending: Internal Medicine | Admitting: Internal Medicine

## 2018-03-23 ENCOUNTER — Other Ambulatory Visit: Payer: Self-pay

## 2018-03-23 ENCOUNTER — Encounter: Payer: Self-pay | Admitting: Internal Medicine

## 2018-03-23 DIAGNOSIS — E039 Hypothyroidism, unspecified: Secondary | ICD-10-CM | POA: Diagnosis present

## 2018-03-23 DIAGNOSIS — Z7983 Long term (current) use of bisphosphonates: Secondary | ICD-10-CM | POA: Diagnosis not present

## 2018-03-23 DIAGNOSIS — S62101A Fracture of unspecified carpal bone, right wrist, initial encounter for closed fracture: Secondary | ICD-10-CM | POA: Diagnosis present

## 2018-03-23 DIAGNOSIS — D649 Anemia, unspecified: Secondary | ICD-10-CM | POA: Diagnosis present

## 2018-03-23 DIAGNOSIS — Y92009 Unspecified place in unspecified non-institutional (private) residence as the place of occurrence of the external cause: Secondary | ICD-10-CM

## 2018-03-23 DIAGNOSIS — F0390 Unspecified dementia without behavioral disturbance: Secondary | ICD-10-CM | POA: Diagnosis present

## 2018-03-23 DIAGNOSIS — Z886 Allergy status to analgesic agent status: Secondary | ICD-10-CM | POA: Diagnosis not present

## 2018-03-23 DIAGNOSIS — K219 Gastro-esophageal reflux disease without esophagitis: Secondary | ICD-10-CM | POA: Diagnosis present

## 2018-03-23 DIAGNOSIS — S52591A Other fractures of lower end of right radius, initial encounter for closed fracture: Principal | ICD-10-CM | POA: Diagnosis present

## 2018-03-23 DIAGNOSIS — Z8619 Personal history of other infectious and parasitic diseases: Secondary | ICD-10-CM

## 2018-03-23 DIAGNOSIS — Z79899 Other long term (current) drug therapy: Secondary | ICD-10-CM

## 2018-03-23 DIAGNOSIS — B0223 Postherpetic polyneuropathy: Secondary | ICD-10-CM | POA: Diagnosis present

## 2018-03-23 DIAGNOSIS — K5900 Constipation, unspecified: Secondary | ICD-10-CM | POA: Diagnosis not present

## 2018-03-23 DIAGNOSIS — Z01810 Encounter for preprocedural cardiovascular examination: Secondary | ICD-10-CM

## 2018-03-23 DIAGNOSIS — F419 Anxiety disorder, unspecified: Secondary | ICD-10-CM | POA: Diagnosis present

## 2018-03-23 DIAGNOSIS — Z8711 Personal history of peptic ulcer disease: Secondary | ICD-10-CM | POA: Diagnosis not present

## 2018-03-23 DIAGNOSIS — M810 Age-related osteoporosis without current pathological fracture: Secondary | ICD-10-CM | POA: Diagnosis present

## 2018-03-23 DIAGNOSIS — F329 Major depressive disorder, single episode, unspecified: Secondary | ICD-10-CM | POA: Diagnosis present

## 2018-03-23 DIAGNOSIS — S52502A Unspecified fracture of the lower end of left radius, initial encounter for closed fracture: Secondary | ICD-10-CM | POA: Diagnosis present

## 2018-03-23 DIAGNOSIS — S62102A Fracture of unspecified carpal bone, left wrist, initial encounter for closed fracture: Secondary | ICD-10-CM

## 2018-03-23 DIAGNOSIS — Z7982 Long term (current) use of aspirin: Secondary | ICD-10-CM

## 2018-03-23 DIAGNOSIS — R7301 Impaired fasting glucose: Secondary | ICD-10-CM | POA: Diagnosis present

## 2018-03-23 DIAGNOSIS — W1830XA Fall on same level, unspecified, initial encounter: Secondary | ICD-10-CM | POA: Diagnosis present

## 2018-03-23 DIAGNOSIS — F17200 Nicotine dependence, unspecified, uncomplicated: Secondary | ICD-10-CM | POA: Diagnosis present

## 2018-03-23 DIAGNOSIS — Z8781 Personal history of (healed) traumatic fracture: Secondary | ICD-10-CM

## 2018-03-23 DIAGNOSIS — Z9889 Other specified postprocedural states: Secondary | ICD-10-CM

## 2018-03-23 DIAGNOSIS — F0280 Dementia in other diseases classified elsewhere without behavioral disturbance: Secondary | ICD-10-CM | POA: Diagnosis present

## 2018-03-23 DIAGNOSIS — R197 Diarrhea, unspecified: Secondary | ICD-10-CM | POA: Diagnosis present

## 2018-03-23 LAB — CBC WITH DIFFERENTIAL/PLATELET
BASOS ABS: 0.1 10*3/uL (ref 0–0.1)
Basophils Relative: 1 %
EOS PCT: 0 %
Eosinophils Absolute: 0 10*3/uL (ref 0–0.7)
HCT: 36.3 % (ref 35.0–47.0)
Hemoglobin: 12.2 g/dL (ref 12.0–16.0)
LYMPHS PCT: 6 %
Lymphs Abs: 0.9 10*3/uL — ABNORMAL LOW (ref 1.0–3.6)
MCH: 29.3 pg (ref 26.0–34.0)
MCHC: 33.5 g/dL (ref 32.0–36.0)
MCV: 87.5 fL (ref 80.0–100.0)
Monocytes Absolute: 1.1 10*3/uL — ABNORMAL HIGH (ref 0.2–0.9)
Monocytes Relative: 8 %
NEUTROS ABS: 12.4 10*3/uL — AB (ref 1.4–6.5)
NEUTROS PCT: 85 %
PLATELETS: 182 10*3/uL (ref 150–440)
RBC: 4.15 MIL/uL (ref 3.80–5.20)
RDW: 17.4 % — ABNORMAL HIGH (ref 11.5–14.5)
WBC: 14.5 10*3/uL — AB (ref 3.6–11.0)

## 2018-03-23 LAB — BASIC METABOLIC PANEL
ANION GAP: 9 (ref 5–15)
BUN: 6 mg/dL — ABNORMAL LOW (ref 8–23)
CO2: 24 mmol/L (ref 22–32)
Calcium: 8.4 mg/dL — ABNORMAL LOW (ref 8.9–10.3)
Chloride: 105 mmol/L (ref 98–111)
Creatinine, Ser: 0.75 mg/dL (ref 0.44–1.00)
GFR calc Af Amer: >60 mL/min (ref >60)
GLUCOSE: 119 mg/dL — AB (ref 70–99)
POTASSIUM: 4.6 mmol/L (ref 3.5–5.1)
Sodium: 138 mmol/L (ref 135–145)

## 2018-03-23 LAB — TROPONIN I

## 2018-03-23 LAB — CK: CK TOTAL: 83 U/L (ref 38–234)

## 2018-03-23 MED ORDER — ONDANSETRON HCL 4 MG PO TABS: 4.0000 mg | ORAL_TABLET | Freq: Four times a day (QID) | ORAL | Status: DC | PRN

## 2018-03-23 MED ORDER — CALCIUM 500-100 MG-UNIT PO CHEW: 1.0000 | CHEWABLE_TABLET | Freq: Two times a day (BID) | ORAL | Status: DC

## 2018-03-23 MED ORDER — CALCIUM CARBONATE-VITAMIN D 500-200 MG-UNIT PO TABS
1.0000 | ORAL_TABLET | Freq: Two times a day (BID) | ORAL | Status: DC
  Administered 2018-03-23 – 2018-03-26 (×4): 1 via ORAL
  Filled 2018-03-23 (×5): qty 1

## 2018-03-23 MED ORDER — CITALOPRAM HYDROBROMIDE 20 MG PO TABS
10.0000 mg | ORAL_TABLET | Freq: Every day | ORAL | Status: DC
  Administered 2018-03-24 – 2018-03-26 (×3): 10 mg via ORAL
  Filled 2018-03-23 (×3): qty 1

## 2018-03-23 MED ORDER — BUPIVACAINE HCL (PF) 0.5 % IJ SOLN
30.0000 mL | Freq: Once | INTRAMUSCULAR | Status: AC
  Administered 2018-03-23: 30 mL
  Filled 2018-03-23: qty 30

## 2018-03-23 MED ORDER — OXYCODONE HCL 5 MG PO TABS
5.0000 mg | ORAL_TABLET | ORAL | Status: DC | PRN
  Administered 2018-03-24 – 2018-03-25 (×6): 5 mg via ORAL
  Filled 2018-03-23 (×7): qty 1

## 2018-03-23 MED ORDER — LEVOTHYROXINE SODIUM 100 MCG PO TABS
100.0000 ug | ORAL_TABLET | Freq: Every day | ORAL | Status: DC
  Administered 2018-03-25 – 2018-03-26 (×2): 100 ug via ORAL
  Filled 2018-03-23 (×2): qty 1

## 2018-03-23 MED ORDER — FLUTICASONE PROPIONATE 50 MCG/ACT NA SUSP
2.0000 | Freq: Every day | NASAL | Status: DC
  Administered 2018-03-25: 2 via NASAL
  Filled 2018-03-23: qty 16

## 2018-03-23 MED ORDER — ADULT MULTIVITAMIN W/MINERALS CH
1.0000 | ORAL_TABLET | Freq: Every day | ORAL | Status: DC
  Administered 2018-03-25 – 2018-03-26 (×2): 1 via ORAL
  Filled 2018-03-23 (×2): qty 1

## 2018-03-23 MED ORDER — NICOTINE 7 MG/24HR TD PT24
7.0000 mg | MEDICATED_PATCH | Freq: Every day | TRANSDERMAL | Status: DC
  Administered 2018-03-23 – 2018-03-26 (×4): 7 mg via TRANSDERMAL
  Filled 2018-03-23 (×4): qty 1

## 2018-03-23 MED ORDER — PANTOPRAZOLE SODIUM 40 MG PO TBEC
40.0000 mg | DELAYED_RELEASE_TABLET | Freq: Two times a day (BID) | ORAL | Status: DC
  Administered 2018-03-23 – 2018-03-26 (×5): 40 mg via ORAL
  Filled 2018-03-23 (×5): qty 1

## 2018-03-23 MED ORDER — ACETAMINOPHEN 325 MG PO TABS: 650.0000 mg | ORAL_TABLET | Freq: Four times a day (QID) | ORAL | Status: DC | PRN

## 2018-03-23 MED ORDER — ACETAMINOPHEN 650 MG RE SUPP: 650.0000 mg | Freq: Four times a day (QID) | RECTAL | Status: DC | PRN

## 2018-03-23 MED ORDER — ACETAMINOPHEN 325 MG PO TABS
650.0000 mg | ORAL_TABLET | Freq: Four times a day (QID) | ORAL | Status: DC | PRN
  Administered 2018-03-25 – 2018-03-26 (×3): 650 mg via ORAL
  Filled 2018-03-23 (×3): qty 2

## 2018-03-23 MED ORDER — SODIUM CHLORIDE 0.9 % IV SOLN
INTRAVENOUS | Status: DC
  Administered 2018-03-23 – 2018-03-25 (×3): via INTRAVENOUS

## 2018-03-23 MED ORDER — GABAPENTIN 300 MG PO CAPS
600.0000 mg | ORAL_CAPSULE | Freq: Three times a day (TID) | ORAL | Status: DC
  Administered 2018-03-23 – 2018-03-26 (×6): 600 mg via ORAL
  Filled 2018-03-23 (×6): qty 2

## 2018-03-23 MED ORDER — URSODIOL 300 MG PO CAPS
300.0000 mg | ORAL_CAPSULE | Freq: Two times a day (BID) | ORAL | Status: DC
  Administered 2018-03-23 – 2018-03-26 (×3): 300 mg via ORAL
  Filled 2018-03-23 (×7): qty 1

## 2018-03-23 MED ORDER — DONEPEZIL HCL 5 MG PO TABS
10.0000 mg | ORAL_TABLET | Freq: Every morning | ORAL | Status: DC
  Administered 2018-03-24 – 2018-03-26 (×3): 10 mg via ORAL
  Filled 2018-03-23 (×3): qty 2

## 2018-03-23 MED ORDER — ONDANSETRON HCL 4 MG/2ML IJ SOLN: 4.0000 mg | Freq: Four times a day (QID) | INTRAMUSCULAR | Status: DC | PRN

## 2018-03-23 NOTE — H&P (Signed)
[facility] [place] NAME: Tina [name]    MR#:  [id number]  DATE OF BIRTH:  1928/02/23  DATE OF ADMISSION:  03/23/2018  PRIMARY CARE PHYSICIAN: Derinda Late, MD   REQUESTING/REFERRING PHYSICIAN: Dr. Charolett Bumpers  CHIEF COMPLAINT:   Chief Complaint  Patient presents with  . Fall    HISTORY OF PRESENT ILLNESS:  Tina [name]  is a 84 y.o. female presents today after a fall on her porch.  She was halfway in her kitchen and halfway on the porch.  She recently got new shoes and family believes that this may be part of the issue.  The patient states that she did not lose consciousness but she cannot remember and tell me the details of what happened with the fall.  She lives by herself.  She does have a history of mild dementia but functions well on her own as per family.  She was found to have bilateral wrist fractures from the fall.  She stated she was on the floor for couple hours before family saw her.  PAST MEDICAL HISTORY:   Past Medical History:  Diagnosis Date  . Anxiety   . Cancer ([place])    breast  . Dementia   . Hypothyroidism   . Shingles     PAST SURGICAL HISTORY:   Past Surgical History:  Procedure Laterality Date  . APPENDECTOMY    . BREAST SURGERY    . EYE SURGERY    . KYPHOPLASTY N/A 06/27/2015   Procedure: KYPHOPLASTY L4;  Surgeon: Hessie Knows, MD;  Location: [facility] ORS;  Service: Orthopedics;  Laterality: N/A;    SOCIAL HISTORY:   Social History   Tobacco Use  . Smoking status: Current Every Day Smoker    Packs/day: 0.50  . Smokeless tobacco: Never Used  Substance Use Topics  . Alcohol use: No    FAMILY HISTORY:   Family History  Problem Relation Age of Onset  . CAD Mother   . CAD Father     DRUG ALLERGIES:   Allergies  Allergen Reactions  . Nsaids Other (See Comments)    H/O GI BLEED  . Meloxicam     Other reaction(s): Other (See Comments) Upper GI Bleeding    REVIEW OF  SYSTEMS:  CONSTITUTIONAL: No fever, fatigue or weakness.  EYES: No blurred or double vision.  EARS, NOSE, AND THROAT: No tinnitus or ear pain. No sore throat RESPIRATORY: No cough, shortness of breath, wheezing or hemoptysis.  CARDIOVASCULAR: No chest pain, orthopnea, edema.  GASTROINTESTINAL: No nausea, vomiting, or abdominal pain. No blood in bowel movements.  Some diarrhea a few weeks ago GENITOURINARY: No dysuria, hematuria.  ENDOCRINE: No polyuria, nocturia.  Positive for hypothyroidism HEMATOLOGY: No anemia, easy bruising or bleeding SKIN: No rash or lesion. MUSCULOSKELETAL: Bilateral wrist pain NEUROLOGIC: No tingling, numbness, weakness.  PSYCHIATRY: No anxiety or depression.   MEDICATIONS AT HOME:   Prior to Admission medications   Medication Sig Start Date End Date Taking? Authorizing Provider  acetaminophen (TYLENOL) 325 MG tablet Take 2 tablets (650 mg total) by mouth every 6 (six) hours as needed for mild pain (or Fever >/= 101). 05/18/17   Nicholes Mango, MD  alendronate (FOSAMAX) 70 MG tablet Take 1 tablet once a week by mouth. 02/12/17   [provider]  aspirin EC 81 MG tablet Take 1 tablet (81 mg total) by mouth daily. 05/18/17 05/18/18  Nicholes Mango, MD  Calcium 500-100 MG-UNIT CHEW Chew 1 tablet 2 (  two) times daily by mouth.    [provider]  citalopram (CELEXA) 10 MG tablet Take 10 mg by mouth daily.    [provider]  docusate sodium (COLACE) 100 MG capsule Take 1 capsule (100 mg total) by mouth 2 (two) times daily as needed for mild constipation. 05/18/17   Gouru, Illene Silver, MD  donepezil (ARICEPT) 10 MG tablet Take 10 mg by mouth every morning.    [provider]  feeding supplement, ENSURE ENLIVE, (ENSURE ENLIVE) LIQD Take 237 mLs by mouth 2 (two) times daily between meals. 05/18/17   Gouru, Illene Silver, MD  fluticasone (FLONASE) 50 MCG/ACT nasal spray Place 2 sprays into both nostrils daily.    [provider]  gabapentin  (NEURONTIN) 300 MG capsule Take 600 mg 3 (three) times daily by mouth.     [provider]  levothyroxine (SYNTHROID, LEVOTHROID) 100 MCG tablet Take 100 mcg daily before breakfast by mouth.     [provider]  Multiple Vitamin (MULTIVITAMIN) tablet Take 1 tablet by mouth daily.    [provider]  nicotine (NICODERM CQ - DOSED IN MG/24 HOURS) 21 mg/24hr patch Place 1 patch (21 mg total) onto the skin daily. Patient not taking: Reported on 06/23/2017 05/19/17   Nicholes Mango, MD  pantoprazole (PROTONIX) 40 MG tablet Take 1 tablet (40 mg total) by mouth 2 (two) times daily. 06/23/17   Virgel Manifold, MD  ursodiol (ACTIGALL) 250 MG tablet TAKE 1 TABLET(250 MG) BY MOUTH THREE TIMES DAILY WITH MEALS 12/14/17   Vonda Antigua B, MD      VITAL SIGNS:  Blood pressure 120/65, pulse 65, temperature 97.7 F (36.5 C), temperature source Oral, resp. rate 16, height 5\' 2"  (1.575 m), weight 70.3 kg, SpO2 95 %.  PHYSICAL EXAMINATION:  GENERAL:  84 y.o.-year-old patient lying in the bed with no acute distress.  EYES: Pupils equal, round, reactive to light and accommodation. No scleral icterus. Extraocular muscles intact.  HEENT: Head atraumatic, normocephalic. Oropharynx and nasopharynx clear.  NECK:  Supple, no jugular venous distention. No thyroid enlargement, no tenderness.  LUNGS: Normal breath sounds bilaterally, no wheezing, rales,rhonchi or crepitation. No use of accessory muscles of respiration.  CARDIOVASCULAR: S1, S2 normal. No murmurs, rubs, or gallops.  ABDOMEN: Soft, nontender, nondistended. Bowel sounds present. No organomegaly or mass.  EXTREMITIES: No pedal edema, cyanosis, or clubbing.  Bilateral wrists in splints. NEUROLOGIC: Cranial nerves II through XII are intact. Muscle strength 5/5 in all extremities. Sensation intact. Gait not checked.  PSYCHIATRIC: The patient is alert and oriented x 3.  SKIN: Skin tear right elbow and bruising right  elbow  LABORATORY PANEL:   CBC Recent Labs  Lab 03/23/18 1855  WBC 14.5*  HGB 12.2  HCT 36.3  PLT 182   ------------------------------------------------------------------------------------------------------------------  Chemistries  Recent Labs  Lab 03/23/18 1855  NA 138  K 4.6  CL 105  CO2 24  GLUCOSE 119*  BUN 6*  CREATININE 0.75  CALCIUM 8.4*   ------------------------------------------------------------------------------------------------------------------  Cardiac Enzymes Recent Labs  Lab 03/23/18 1855  TROPONINI <0.03   ------------------------------------------------------------------------------------------------------------------  RADIOLOGY:  Dg Wrist Complete Left  Result Date: 03/23/2018 CLINICAL DATA:  Unwitnessed fall.  Bilateral forearm pain, swelling EXAM: LEFT WRIST - COMPLETE 3+ VIEW COMPARISON:  None. FINDINGS: There is a mildly impacted fracture in the distal left radius. No visible ulnar abnormality. No subluxation or dislocation. IMPRESSION: Mildly impacted distal left radial fracture. Electronically Signed   By: Rolm Baptise M.D.   On: 03/23/2018  18:40   Dg Wrist Complete Right  Result Date: 03/23/2018 CLINICAL DATA:  Status post splint placement. EXAM: RIGHT WRIST - COMPLETE 3+ VIEW COMPARISON:  RIGHT wrist radiograph March 23, 2018 at 1821 hours FINDINGS: Acute distal radial fracture with impaction, dorsal angulation distal bony fracture improved from prior imaging. Acute displaced ulnar styloid fracture. No dislocation. Osteopenia. Soft tissue swelling. Thick fiberglass cast obscures the fine bony detail. IMPRESSION: Fiberglass splint transfixing acute displaced distal radial fracture in improved alignment and displaced ulnar styloid fracture. Electronically Signed   By: Elon Alas M.D.   On: 03/23/2018 19:59   Dg Wrist Complete Right  Result Date: 03/23/2018 CLINICAL DATA:  Fall, right wrist deformity and pain EXAM: RIGHT WRIST -  COMPLETE 3+ VIEW COMPARISON:  03/23/2018 FINDINGS: Acute dorsally displaced fractures of the right distal radius and ulna. Soft tissue swelling noted. Bones are osteopenic. Carpal bones appear intact. IMPRESSION: Acute dorsally displaced distal radius and ulna fractures. Osteopenia Electronically Signed   By: Jerilynn Mages.  Shick M.D.   On: 03/23/2018 18:41    EKG:   Ordered by me  IMPRESSION AND PLAN:   1.  Preoperative consultation for bilateral wrist fracture.  EKG preop ordered by me.  So far no contraindications to surgery. 2.  History of dementia on Aricept 3.  History of peptic ulcer disease on PPI 4.  Postherpetic neuralgia on gabapentin 5.  History of anxiety on Celexa 6.  Hypothyroidism unspecified on levothyroxine 7.  History of macular degeneration 8.  Tobacco abuse.  Smoking cessation counseling done 4 minutes by me.  Nicotine patch ordered 9.  Impaired fasting glucose send off a hemoglobin A1c  All the records are reviewed and case discussed with ED provider. Management plans discussed with the patient, family and they are in agreement.  CODE STATUS: Full code  TOTAL TIME TAKING CARE OF THIS PATIENT: 50 minutes, including acp time.    Loletha Grayer M.D on 03/23/2018 at 8:51 PM  Between 7am to 6pm - Pager - [phone]  After 6pm call admission pager [phone]  [facility]  [phone]  CC: Primary care physician; Derinda Late, MD

## 2018-03-23 NOTE — Progress Notes (Signed)
Patient ID: Tina [name], female   DOB: 1927/10/26, 84 y.o.   MRN: [id number]  ACP note  Patient and family present  Diagnosis: Bilateral wrist fractures, Dementia, peptic ulcer disease, postherpetic neuralgia, history of anxiety, hypothyroidism, history of macular degeneration, tobacco abuse, impaired fasting glucose  CODE STATUS discussed and patient wishes to be a full code  Plan.  Orthopedic evaluation decide whether wrist fracture is surgical or not.  Pain control.  Physical therapy evaluation  Time spent on ACP discussion 17 minutes Dr. Loletha Grayer

## 2018-03-23 NOTE — ED Provider Notes (Signed)
[facility] Emergency Department Provider Note   ____________________________________________   I have reviewed the triage vital signs and the nursing notes.   HISTORY  Chief Complaint Bilateral wrist pain  History limited by: Not Limited   HPI Tina [name] is a 84 y.o. female who presents to the emergency department today because of concern for bilateral wrist pain. The patient was outside when she fell. She thinks she lost her balance. Was not able to get up without help because of the wrist pain. Denies any lower extremity pain. Denies any LOC.    Per medical record review patient has a history of dementia.   Past Medical History:  Diagnosis Date  . Anxiety   . Cancer ([place])    breast  . Dementia   . Hypothyroidism   . Shingles     Patient Active Problem List   Diagnosis Date Noted  . Dementia in Alzheimer's disease 05/24/2017  . GI bleed 05/15/2017  . Acute blood loss anemia 05/15/2017  . Acute encephalopathy 05/15/2017  . Tachycardia 05/15/2017  . AMD (age related macular degeneration) 01/03/2015  . Allergic rhinitis 12/12/2013  . Depressive disorder, not elsewhere classified 12/12/2013  . Hypothyroidism 12/12/2013  . Postherpetic neuralgia 12/12/2013    Past Surgical History:  Procedure Laterality Date  . APPENDECTOMY    . BREAST SURGERY    . EYE SURGERY    . KYPHOPLASTY N/A 06/27/2015   Procedure: KYPHOPLASTY L4;  Surgeon: Hessie Knows, MD;  Location: [facility] ORS;  Service: Orthopedics;  Laterality: N/A;    Prior to Admission medications   Medication Sig Start Date End Date Taking? Authorizing Provider  acetaminophen (TYLENOL) 325 MG tablet Take 2 tablets (650 mg total) by mouth every 6 (six) hours as needed for mild pain (or Fever >/= 101). 05/18/17   Nicholes Mango, MD  alendronate (FOSAMAX) 70 MG tablet Take 1 tablet once a week by mouth. 02/12/17   [provider]  aspirin EC 81 MG tablet Take 1 tablet (81 mg total) by  mouth daily. 05/18/17 05/18/18  Nicholes Mango, MD  Calcium 500-100 MG-UNIT CHEW Chew 1 tablet 2 (two) times daily by mouth.    [provider]  citalopram (CELEXA) 10 MG tablet Take 10 mg by mouth daily.    [provider]  docusate sodium (COLACE) 100 MG capsule Take 1 capsule (100 mg total) by mouth 2 (two) times daily as needed for mild constipation. 05/18/17   Gouru, Illene Silver, MD  donepezil (ARICEPT) 10 MG tablet Take 10 mg by mouth every morning.    [provider]  feeding supplement, ENSURE ENLIVE, (ENSURE ENLIVE) LIQD Take 237 mLs by mouth 2 (two) times daily between meals. 05/18/17   Gouru, Illene Silver, MD  fluticasone (FLONASE) 50 MCG/ACT nasal spray Place 2 sprays into both nostrils daily.    [provider]  gabapentin (NEURONTIN) 300 MG capsule Take 600 mg 3 (three) times daily by mouth.     [provider]  levothyroxine (SYNTHROID, LEVOTHROID) 100 MCG tablet Take 100 mcg daily before breakfast by mouth.     [provider]  Multiple Vitamin (MULTIVITAMIN) tablet Take 1 tablet by mouth daily.    [provider]  nicotine (NICODERM CQ - DOSED IN MG/24 HOURS) 21 mg/24hr patch Place 1 patch (21 mg total) onto the skin daily. Patient not taking: Reported on 06/23/2017 05/19/17   Nicholes Mango, MD  pantoprazole (PROTONIX) 40 MG tablet Take 1 tablet (40 mg total) by mouth 2 (  two) times daily. 06/23/17   Virgel Manifold, MD  ursodiol (ACTIGALL) 250 MG tablet TAKE 1 TABLET(250 MG) BY MOUTH THREE TIMES DAILY WITH MEALS 12/14/17   Virgel Manifold, MD    Allergies Nsaids and Meloxicam  No family history on file.  Social History Social History   Tobacco Use  . Smoking status: Current Every Day Smoker    Packs/day: 0.50  . Smokeless tobacco: Never Used  Substance Use Topics  . Alcohol use: No  . Drug use: No    Review of Systems Constitutional: No fever/chills Eyes: No visual changes. ENT: No sore  throat. Cardiovascular: Denies chest pain. Respiratory: Denies shortness of breath. Gastrointestinal: No abdominal pain.  No nausea, no vomiting.  No diarrhea.   Genitourinary: Negative for dysuria. Musculoskeletal: Positive for bilateral wrist pain. Skin: Negative for rash. Neurological: Negative for headaches, focal weakness or numbness.  ____________________________________________   PHYSICAL EXAM:  VITAL SIGNS: ED Triage Vitals  Enc Vitals Group     BP 03/23/18 1819 120/65     Pulse Rate 03/23/18 1819 65     Resp 03/23/18 1819 16     Temp 03/23/18 1819 97.7 F (36.5 C)     Temp Source 03/23/18 1819 Oral     SpO2 03/23/18 1817 95 %     Weight 03/23/18 1820 155 lb (70.3 kg)     Height 03/23/18 1820 5\' 2"  (1.575 m)     Head Circumference --      Peak Flow --      Pain Score 03/23/18 1819 9   Constitutional: Alert and oriented.  Eyes: Conjunctivae are normal.  ENT      Head: Normocephalic and atraumatic.      Nose: No congestion/rhinnorhea.      Mouth/Throat: Mucous membranes are moist.      Neck: No stridor. Hematological/Lymphatic/Immunilogical: No cervical lymphadenopathy. Cardiovascular: Normal rate, regular rhythm.  No murmurs, rubs, or gallops.  Respiratory: Normal respiratory effort without tachypnea nor retractions. Breath sounds are clear and equal bilaterally. No wheezes/rales/rhonchi. Gastrointestinal: Soft and non tender. No rebound. No guarding.  Genitourinary: Deferred Musculoskeletal: Bilateral swelling to wrists, bilateral tenderness. Deformity noted to both, worse on right side.  Neurologic:  Normal speech and language. No gross focal neurologic deficits are appreciated.  Skin:  Skin is warm, dry and intact. No rash noted. Psychiatric: Mood and affect are normal. Speech and behavior are normal. Patient exhibits appropriate insight and judgment.  ____________________________________________    LABS (pertinent positives/negatives)  Trop <0.03 BMP na  138, k 4.6, glu 119, cr 0.75 CBC wbc 14.5, hgb 12.2, plt 182  ____________________________________________   EKG  None  ____________________________________________    RADIOLOGY  Right wrist Distal radial/ulna fracture with dorsal displacement  Left wrist Distal radial fracture  ____________________________________________   PROCEDURES  Procedures  POST SPLINT CHECK Right volar wrist splint applied by tech.  Good position.  Distally N/V intact, sensation intact. No discoloration.  POST SPLINT CHECK Left volar wrist splint applied by tech.  Good position.  Distally N/V intact, sensation intact. No discoloration.  ____________________________________________   INITIAL IMPRESSION / ASSESSMENT AND PLAN / ED COURSE  Pertinent labs & imaging results that were available during my care of the patient were reviewed by me and considered in my medical decision making (see chart for details).   Patient presented to the emergency department today after a fall.  Complaining of bilateral wrist pain.  On exam there is deformity and tenderness of bilateral wrists.  Neurovascularly intact distally.  X-rays do show bilateral wrist fractures.  Discussed with Dr. Marry Guan.  Will splint in the emergency department plan on admission.  ____________________________________________   FINAL CLINICAL IMPRESSION(S) / ED DIAGNOSES  Final diagnoses:  Closed fracture of both wrists, initial encounter     Note: This dictation was prepared with Dragon dictation. Any transcriptional errors that result from this process are unintentional     Nance Pear, MD 03/23/18 1940

## 2018-03-23 NOTE — Plan of Care (Signed)
  Problem: Coping: Goal: Level of anxiety will decrease Outcome: Progressing   Problem: Elimination: Goal: Will not experience complications related to bowel motility Outcome: Progressing Goal: Will not experience complications related to urinary retention Outcome: Progressing   Problem: Pain Managment: Goal: General experience of comfort will improve Outcome: Progressing   

## 2018-03-23 NOTE — ED Triage Notes (Signed)
Pt arrived via EMS after unwitnessed fall at home at approx. 1400. Denies LOC. C/o bilateral forearm pain and swelling. Ice and splint applied by EMS to rt forearm PTA

## 2018-03-24 ENCOUNTER — Other Ambulatory Visit: Payer: Self-pay

## 2018-03-24 ENCOUNTER — Encounter
Admission: EM | Disposition: A | Discharge status: Skilled Nursing Facility | Payer: Self-pay | Source: Home / Self Care | Attending: Specialist

## 2018-03-24 ENCOUNTER — Inpatient Hospital Stay: Payer: Medicare Other

## 2018-03-24 ENCOUNTER — Inpatient Hospital Stay: Payer: Medicare Other | Admitting: Certified Registered"

## 2018-03-24 HISTORY — PX: ORIF WRIST FRACTURE: SHX2133

## 2018-03-24 LAB — BASIC METABOLIC PANEL
ANION GAP: 6 (ref 5–15)
BUN: 6 mg/dL — ABNORMAL LOW (ref 8–23)
CALCIUM: 8 mg/dL — AB (ref 8.9–10.3)
CO2: 26 mmol/L (ref 22–32)
Chloride: 107 mmol/L (ref 98–111)
Creatinine, Ser: 0.76 mg/dL (ref 0.44–1.00)
GFR calc non Af Amer: >60 mL/min (ref >60)
Glucose, Bld: 90 mg/dL (ref 70–99)
POTASSIUM: 3.9 mmol/L (ref 3.5–5.1)
Sodium: 139 mmol/L (ref 135–145)

## 2018-03-24 LAB — CBC
HCT: 32.9 % — ABNORMAL LOW (ref 35.0–47.0)
HEMOGLOBIN: 11.4 g/dL — AB (ref 12.0–16.0)
MCH: 30.3 pg (ref 26.0–34.0)
MCHC: 34.8 g/dL (ref 32.0–36.0)
MCV: 87.1 fL (ref 80.0–100.0)
Platelets: 163 10*3/uL (ref 150–440)
RBC: 3.77 MIL/uL — AB (ref 3.80–5.20)
RDW: 17.1 % — ABNORMAL HIGH (ref 11.5–14.5)
WBC: 10.4 10*3/uL (ref 3.6–11.0)

## 2018-03-24 LAB — HEMOGLOBIN A1C
Hgb A1c MFr Bld: 5.5 % (ref 4.8–5.6)
Mean Plasma Glucose: 111.15 mg/dL

## 2018-03-24 LAB — SURGICAL PCR SCREEN
MRSA, PCR: NEGATIVE
Staphylococcus aureus: NEGATIVE

## 2018-03-24 SURGERY — OPEN REDUCTION INTERNAL FIXATION (ORIF) WRIST FRACTURE
Anesthesia: General | Laterality: Bilateral

## 2018-03-24 MED ORDER — ONDANSETRON HCL 4 MG/2ML IJ SOLN
INTRAMUSCULAR | Status: AC
  Filled 2018-03-24: qty 2

## 2018-03-24 MED ORDER — ACETAMINOPHEN 10 MG/ML IV SOLN
INTRAVENOUS | Status: DC | PRN
  Administered 2018-03-24: 1000 mg via INTRAVENOUS

## 2018-03-24 MED ORDER — DOCUSATE SODIUM 100 MG PO CAPS
100.0000 mg | ORAL_CAPSULE | Freq: Two times a day (BID) | ORAL | Status: DC
  Administered 2018-03-24 – 2018-03-26 (×4): 100 mg via ORAL
  Filled 2018-03-24 (×4): qty 1

## 2018-03-24 MED ORDER — CEFAZOLIN SODIUM-DEXTROSE 1-4 GM/50ML-% IV SOLN
1.0000 g | Freq: Once | INTRAVENOUS | Status: AC
  Administered 2018-03-24: 1 g via INTRAVENOUS
  Filled 2018-03-24: qty 50

## 2018-03-24 MED ORDER — DEXAMETHASONE SODIUM PHOSPHATE 10 MG/ML IJ SOLN
INTRAMUSCULAR | Status: DC | PRN
  Administered 2018-03-24: 4 mg via INTRAVENOUS

## 2018-03-24 MED ORDER — FENTANYL CITRATE (PF) 100 MCG/2ML IJ SOLN
INTRAMUSCULAR | Status: AC
  Filled 2018-03-24: qty 2

## 2018-03-24 MED ORDER — DEXAMETHASONE SODIUM PHOSPHATE 10 MG/ML IJ SOLN
INTRAMUSCULAR | Status: AC
  Filled 2018-03-24: qty 1

## 2018-03-24 MED ORDER — NEOMYCIN-POLYMYXIN B GU 40-200000 IR SOLN
Status: DC | PRN
  Administered 2018-03-24: 4 mL

## 2018-03-24 MED ORDER — FENTANYL CITRATE (PF) 100 MCG/2ML IJ SOLN
INTRAMUSCULAR | Status: DC | PRN
  Administered 2018-03-24 (×2): 25 ug via INTRAVENOUS
  Administered 2018-03-24: 50 ug via INTRAVENOUS

## 2018-03-24 MED ORDER — PROPOFOL 10 MG/ML IV BOLUS
INTRAVENOUS | Status: DC | PRN
  Administered 2018-03-24: 70 mg via INTRAVENOUS

## 2018-03-24 MED ORDER — ACETAMINOPHEN 10 MG/ML IV SOLN
INTRAVENOUS | Status: AC
  Filled 2018-03-24: qty 100

## 2018-03-24 MED ORDER — METOCLOPRAMIDE HCL 5 MG/ML IJ SOLN: 5.0000 mg | Freq: Three times a day (TID) | INTRAMUSCULAR | Status: DC | PRN

## 2018-03-24 MED ORDER — KETAMINE HCL 50 MG/ML IJ SOLN
INTRAMUSCULAR | Status: DC | PRN
  Administered 2018-03-24: 25 mg via INTRAVENOUS

## 2018-03-24 MED ORDER — CEFAZOLIN (ANCEF) 1 G IV SOLR: 1.0000 g | INTRAVENOUS | Status: DC

## 2018-03-24 MED ORDER — SUCCINYLCHOLINE CHLORIDE 20 MG/ML IJ SOLN
INTRAMUSCULAR | Status: DC | PRN
  Administered 2018-03-24: 80 mg via INTRAVENOUS

## 2018-03-24 MED ORDER — ROCURONIUM BROMIDE 100 MG/10ML IV SOLN
INTRAVENOUS | Status: DC | PRN
  Administered 2018-03-24: 5 mg via INTRAVENOUS

## 2018-03-24 MED ORDER — PHENYLEPHRINE HCL 10 MG/ML IJ SOLN
INTRAMUSCULAR | Status: DC | PRN
  Administered 2018-03-24: 100 ug via INTRAVENOUS
  Administered 2018-03-24: 50 ug via INTRAVENOUS

## 2018-03-24 MED ORDER — FAMOTIDINE 20 MG PO TABS
20.0000 mg | ORAL_TABLET | Freq: Once | ORAL | Status: AC
  Administered 2018-03-24: 20 mg via ORAL

## 2018-03-24 MED ORDER — ONDANSETRON HCL 4 MG/2ML IJ SOLN: 4.0000 mg | Freq: Once | INTRAMUSCULAR | Status: DC | PRN

## 2018-03-24 MED ORDER — FAMOTIDINE 20 MG PO TABS
ORAL_TABLET | ORAL | Status: AC
  Filled 2018-03-24: qty 1

## 2018-03-24 MED ORDER — EPHEDRINE SULFATE 50 MG/ML IJ SOLN
INTRAMUSCULAR | Status: AC
  Filled 2018-03-24: qty 1

## 2018-03-24 MED ORDER — ONDANSETRON HCL 4 MG/2ML IJ SOLN
INTRAMUSCULAR | Status: DC | PRN
  Administered 2018-03-24: 4 mg via INTRAVENOUS

## 2018-03-24 MED ORDER — METOCLOPRAMIDE HCL 10 MG PO TABS: 5.0000 mg | ORAL_TABLET | Freq: Three times a day (TID) | ORAL | Status: DC | PRN

## 2018-03-24 MED ORDER — LIDOCAINE HCL (CARDIAC) PF 100 MG/5ML IV SOSY
PREFILLED_SYRINGE | INTRAVENOUS | Status: DC | PRN
  Administered 2018-03-24: 60 mg via INTRAVENOUS

## 2018-03-24 MED ORDER — FENTANYL CITRATE (PF) 100 MCG/2ML IJ SOLN
25.0000 ug | INTRAMUSCULAR | Status: DC | PRN
  Administered 2018-03-24: 25 ug via INTRAVENOUS

## 2018-03-24 MED ORDER — PROPOFOL 10 MG/ML IV BOLUS
INTRAVENOUS | Status: AC
  Filled 2018-03-24: qty 20

## 2018-03-24 MED ORDER — ENOXAPARIN SODIUM 40 MG/0.4ML ~~LOC~~ SOLN
40.0000 mg | SUBCUTANEOUS | Status: DC
  Administered 2018-03-25 – 2018-03-26 (×2): 40 mg via SUBCUTANEOUS
  Filled 2018-03-24 (×2): qty 0.4

## 2018-03-24 SURGICAL SUPPLY — 49 items
BANDAGE ACE 4X5 VEL STRL LF (GAUZE/BANDAGES/DRESSINGS) ×6 IMPLANT
BANDAGE ELASTIC 4 LF NS (GAUZE/BANDAGES/DRESSINGS) ×6 IMPLANT
BIT DRILL 2.5X4 QC (BIT) ×3 IMPLANT
CANISTER SUCT 1200ML W/VALVE (MISCELLANEOUS) ×6 IMPLANT
CHLORAPREP W/TINT 26ML (MISCELLANEOUS) ×6 IMPLANT
CUFF TOURN 18 STER (MISCELLANEOUS) IMPLANT
CUFF TOURN DUAL PL 12 NO SLV (MISCELLANEOUS) ×6 IMPLANT
DRAPE EXTREMITY 106X87X128.5 (DRAPES) ×3 IMPLANT
DRAPE FLUOR MINI C-ARM 54X84 (DRAPES) ×3 IMPLANT
DRAPE SHEET LG 3/4 BI-LAMINATE (DRAPES) ×6 IMPLANT
DRIVER PEG 2.0 FAST (BIT) ×3 IMPLANT
ELECT CAUTERY BLADE 6.4 (BLADE) ×3 IMPLANT
ELECT REM PT RETURN 9FT ADLT (ELECTROSURGICAL) ×6
ELECTRODE REM PT RTRN 9FT ADLT (ELECTROSURGICAL) ×2 IMPLANT
GAUZE PETRO XEROFOAM 1X8 (MISCELLANEOUS) ×6 IMPLANT
GAUZE SPONGE 4X4 12PLY STRL (GAUZE/BANDAGES/DRESSINGS) ×3 IMPLANT
GLOVE SURG SYN 9.0  PF PI (GLOVE) ×4
GLOVE SURG SYN 9.0 PF PI (GLOVE) ×2 IMPLANT
GOWN SRG 2XL LVL 4 RGLN SLV (GOWNS) ×1 IMPLANT
GOWN STRL NON-REIN 2XL LVL4 (GOWNS) ×2
GOWN STRL REUS W/ TWL LRG LVL3 (GOWN DISPOSABLE) ×2 IMPLANT
GOWN STRL REUS W/TWL LRG LVL3 (GOWN DISPOSABLE) ×4
K-WIRE 1.6 (WIRE) ×2
K-WIRE FX5X1.6XNS BN SS (WIRE) ×1
KIT TURNOVER KIT A (KITS) ×3 IMPLANT
KWIRE FX5X1.6XNS BN SS (WIRE) ×1 IMPLANT
NEEDLE FILTER BLUNT 18X 1/2SAF (NEEDLE) ×2
NEEDLE FILTER BLUNT 18X1 1/2 (NEEDLE) ×1 IMPLANT
NS IRRIG 500ML POUR BTL (IV SOLUTION) ×3 IMPLANT
PACK EXTREMITY ARMC (MISCELLANEOUS) ×3 IMPLANT
PAD CAST CTTN 4X4 STRL (SOFTGOODS) ×2 IMPLANT
PADDING CAST COTTON 4X4 STRL (SOFTGOODS) ×4
PEG SUBCHONDRAL SMOOTH 2.0X14 (Peg) ×3 IMPLANT
PEG SUBCHONDRAL SMOOTH 2.0X16 (Peg) ×3 IMPLANT
PEG SUBCHONDRAL SMOOTH 2.0X18 (Peg) ×12 IMPLANT
PEG SUBCHONDRAL SMOOTH 2.0X20 (Peg) ×9 IMPLANT
PEG SUBCHONDRAL SMOOTH 2.0X22 (Peg) ×6 IMPLANT
PLATE SHORT 21.6X48.9 NRRW LT (Plate) ×3 IMPLANT
PLATE SHORT 21.6X48.9 NRRW RT (Plate) ×3 IMPLANT
SCALPEL PROTECTED #15 DISP (BLADE) ×6 IMPLANT
SCREW BN 12X3.5XNS CORT TI (Screw) ×1 IMPLANT
SCREW CORT 3.5X10 LNG (Screw) ×6 IMPLANT
SCREW CORT 3.5X12 (Screw) ×2 IMPLANT
SPLINT CAST 1 STEP 3X12 (MISCELLANEOUS) ×3 IMPLANT
SUT ETHILON 4-0 (SUTURE) ×4
SUT ETHILON 4-0 FS2 18XMFL BLK (SUTURE) ×2
SUT VICRYL 3-0 27IN (SUTURE) ×3 IMPLANT
SUTURE ETHLN 4-0 FS2 18XMF BLK (SUTURE) ×2 IMPLANT
SYR 3ML LL SCALE MARK (SYRINGE) ×3 IMPLANT

## 2018-03-24 NOTE — Anesthesia Preprocedure Evaluation (Signed)
Anesthesia Evaluation  Patient identified by MRN, date of birth, ID band Patient awake    Reviewed: Allergy & Precautions, H&P , NPO status , Patient's Chart, lab work & pertinent test results, reviewed documented beta blocker date and time   History of Anesthesia Complications Negative for: history of anesthetic complications  Airway Mallampati: II  TM Distance: >3 FB Neck ROM: full    Dental no notable dental hx. (+) Upper Dentures, Edentulous Upper, Partial Lower, Poor Dentition   Pulmonary neg pulmonary ROS, Current Smoker,    Pulmonary exam normal breath sounds clear to auscultation       Cardiovascular Exercise Tolerance: Good negative cardio ROS Normal cardiovascular exam Rhythm:regular Rate:Normal     Neuro/Psych PSYCHIATRIC DISORDERS (Dementia and anxiety) Anxiety Depression Dementia negative neurological ROS  negative psych ROS   GI/Hepatic negative GI ROS, Neg liver ROS,   Endo/Other  neg diabetesHypothyroidism   Renal/GU negative Renal ROS  negative genitourinary   Musculoskeletal negative musculoskeletal ROS (+)   Abdominal   Peds negative pediatric ROS (+)  Hematology negative hematology ROS (+) anemia ,   Anesthesia Other Findings Past Medical History:   Shingles                                                     Cancer ([facility])                                                   Comment:breast   Hypothyroidism                                               Anxiety                                                      Dementia                                                     Reproductive/Obstetrics negative OB ROS                             Anesthesia Physical  Anesthesia Plan  ASA: II  Anesthesia Plan: General   Post-op Pain Management:    Induction: Intravenous  PONV Risk Score and Plan:   Airway Management Planned: Oral ETT  Additional Equipment:    Intra-op Plan:   Post-operative Plan: Extubation in OR  Informed Consent: I have reviewed the patients History and Physical, chart, labs and discussed the procedure including the risks, benefits and alternatives for the proposed anesthesia with the patient or authorized representative who has indicated his/her understanding and acceptance.   Dental Advisory Given  Plan Discussed with: Anesthesiologist, CRNA and Surgeon  Anesthesia Plan Comments:  Anesthesia Quick Evaluation  

## 2018-03-24 NOTE — Progress Notes (Signed)
[place] [facility] [place] NAME: Tina [name]    MR#:  [id number]  DATE OF BIRTH:  [birth year]  SUBJECTIVE:   Patient here after a mechanical fall and noted to have bilateral wrist fractures.  Status post ORIF of bilateral wrist fractures.  seen in the PACU post surgery and somewhat lethargic but follows commands. Complaining of pain in both of her wrists/hands.  REVIEW OF SYSTEMS:    Review of Systems  Constitutional: Negative for chills and fever.  HENT: Negative for congestion and tinnitus.   Eyes: Negative for blurred vision and double vision.  Respiratory: Negative for cough, shortness of breath and wheezing.   Cardiovascular: Negative for chest pain, orthopnea and PND.  Gastrointestinal: Negative for abdominal pain, diarrhea, nausea and vomiting.  Genitourinary: Negative for dysuria and hematuria.  Musculoskeletal: Positive for falls and joint pain (b/l Wrists).  Neurological: Negative for dizziness, sensory change and focal weakness.  All other systems reviewed and are negative.   Nutrition: Regular Tolerating Diet: yes Tolerating PT: Await Eval.   DRUG ALLERGIES:   Allergies  Allergen Reactions  . Nsaids Other (See Comments)    H/O GI BLEED  . Meloxicam     Other reaction(s): Other (See Comments) Upper GI Bleeding    VITALS:  Blood pressure 139/67, pulse 83, temperature 98.2 F (36.8 C), temperature source Oral, resp. rate 16, height 5' (1.524 m), weight 68 kg, SpO2 100 %.  PHYSICAL EXAMINATION:   Physical Exam  GENERAL:  84 y.o.-year-old thin patient lying in bed lethargic post-operatively. EYES: Pupils equal, round, reactive to light and accommodation. No scleral icterus. Extraocular muscles intact.  HEENT: Head atraumatic, normocephalic. Oropharynx and nasopharynx clear.  NECK:  Supple, no jugular venous distention. No thyroid enlargement, no tenderness.  LUNGS: Normal breath sounds bilaterally, no wheezing, rales,  rhonchi. No use of accessory muscles of respiration.  CARDIOVASCULAR: S1, S2 normal. No murmurs, rubs, or gallops.  ABDOMEN: Soft, nontender, nondistended. Bowel sounds present. No organomegaly or mass.  EXTREMITIES: No cyanosis, clubbing or edema b/l.   B/l Wrist/hand dressings in place from surgery NEUROLOGIC: Cranial nerves II through XII are intact. No focal Motor or sensory deficits b/l.   PSYCHIATRIC: The patient is alert and oriented x 1.  SKIN: No obvious rash, lesion, or ulcer.    LABORATORY PANEL:   CBC Recent Labs  Lab 03/24/18 0605  WBC 10.4  HGB 11.4*  HCT 32.9*  PLT 163   ------------------------------------------------------------------------------------------------------------------  Chemistries  Recent Labs  Lab 03/24/18 0605  NA 139  K 3.9  CL 107  CO2 26  GLUCOSE 90  BUN 6*  CREATININE 0.76  CALCIUM 8.0*   ------------------------------------------------------------------------------------------------------------------  Cardiac Enzymes Recent Labs  Lab 03/23/18 1855  TROPONINI <0.03   ------------------------------------------------------------------------------------------------------------------  RADIOLOGY:  Dg Wrist 2 Views Left  Result Date: 03/24/2018 CLINICAL DATA:  Post ORIF of left wrist fracture EXAM: LEFT WRIST - 2 VIEW COMPARISON:  Left wrist films of 03/23/2017 FINDINGS: The impacted fracture of the distal left radius has been stabilized with placement of fixation plate and screws. Anatomic alignment is present. No complicating features are seen. IMPRESSION: ORIF of transverse impacted distal left radial fracture. Electronically Signed   By: Ivar Drape M.D.   On: 03/24/2018 14:07   Dg Wrist 2 Views Right  Result Date: 03/24/2018 CLINICAL DATA:  ORIF. EXAM: RIGHT WRIST - 2 VIEW COMPARISON:  03/23/2018 FINDINGS: Examination demonstrates a volar fixation plate with screws bridging patient's distal radial fracture  as hardware is intact  with anatomic alignment over the fracture site. Continued evidence of patient's distal displaced ulnar fracture. Remainder of the exam is unchanged. IMPRESSION: Fixation patient's distal radial fracture with hardware intact and anatomic alignment over the fracture site. No change in patient's nondisplaced distal ulnar fracture. Electronically Signed   By: Marin Olp M.D.   On: 03/24/2018 13:53   Dg Wrist Complete Left  Result Date: 03/23/2018 CLINICAL DATA:  Unwitnessed fall.  Bilateral forearm pain, swelling EXAM: LEFT WRIST - COMPLETE 3+ VIEW COMPARISON:  None. FINDINGS: There is a mildly impacted fracture in the distal left radius. No visible ulnar abnormality. No subluxation or dislocation. IMPRESSION: Mildly impacted distal left radial fracture. Electronically Signed   By: Rolm Baptise M.D.   On: 03/23/2018 18:40   Dg Wrist Complete Right  Result Date: 03/23/2018 CLINICAL DATA:  Status post splint placement. EXAM: RIGHT WRIST - COMPLETE 3+ VIEW COMPARISON:  RIGHT wrist radiograph March 23, 2018 at 1821 hours FINDINGS: Acute distal radial fracture with impaction, dorsal angulation distal bony fracture improved from prior imaging. Acute displaced ulnar styloid fracture. No dislocation. Osteopenia. Soft tissue swelling. Thick fiberglass cast obscures the fine bony detail. IMPRESSION: Fiberglass splint transfixing acute displaced distal radial fracture in improved alignment and displaced ulnar styloid fracture. Electronically Signed   By: Elon Alas M.D.   On: 03/23/2018 19:59   Dg Wrist Complete Right  Result Date: 03/23/2018 CLINICAL DATA:  Fall, right wrist deformity and pain EXAM: RIGHT WRIST - COMPLETE 3+ VIEW COMPARISON:  03/23/2018 FINDINGS: Acute dorsally displaced fractures of the right distal radius and ulna. Soft tissue swelling noted. Bones are osteopenic. Carpal bones appear intact. IMPRESSION: Acute dorsally displaced distal radius and ulna fractures. Osteopenia  Electronically Signed   By: Jerilynn Mages.  Shick M.D.   On: 03/23/2018 18:41     ASSESSMENT AND PLAN:   84 year old female with past medical history of dementia, anxiety, hypothyroidism who presents to the hospital after a mechanical fall and noted to have bilateral wrist fractures.  1.  Status post fall and bilateral wrist fractures- seen by orthopedics and status post ORIF of both wrists. -Continue pain control as per orthopedics, physical therapy as tolerated.  2.  Hypothyroidism-continue Synthroid.  3.  Dementia-continue Aricept.  4.  Tobacco abuse-continue nicotine patch.  5.  Osteoporosis-continue calcium vitamin D supplements.  6.  Depression-continue Celexa.  7.  GERD-continue Protonix.     All the records are reviewed and case discussed with Care Management/Social Worker. Management plans discussed with the patient, family and they are in agreement.  CODE STATUS: Full code  DVT Prophylaxis: Lovenox  TOTAL TIME TAKING CARE OF THIS PATIENT: 30 minutes.   POSSIBLE D/C IN 1-2 DAYS, DEPENDING ON CLINICAL CONDITION.   Henreitta Leber M.D on 03/24/2018 at 4:24 PM  Between 7am to 6pm - Pager - [phone]  After 6pm go to [email or web address] - Proofreader  [facility]  Office  [phone]  CC: Primary care physician; Derinda Late, MD

## 2018-03-24 NOTE — Clinical Social Work Note (Signed)
Clinical Social Work Assessment  Patient Details  Name: Tina [name] MRN: [id number] Date of Birth: 22-Aug-1927  Date of referral:  03/24/18               Reason for consult:  Facility Placement                Permission sought to share information with:  Chartered certified accountant granted to share information::  Yes, Verbal Permission Granted  Name::      [place]::   [facility]   Relationship::     Contact Information:     Housing/Transportation Living arrangements for the past 2 months:  [place] of Information:  Adult Children Patient Interpreter Needed:  None Criminal Activity/Legal Involvement Pertinent to Current Situation/Hospitalization:  No - Comment as needed Significant Relationships:  Adult Children Lives with:  Self Do you feel safe going back to the place where you live?  Yes Need for family participation in patient care:  Yes (Comment)  Care giving concerns:  Patient lives alone in [place].    Facilities manager / plan:  Holiday representative ([place]) received verbal consult from PT that recommendation is SNF. CSW attempted to meet with patient however she was off the floor for surgery. CSW contacted patient's son Tina [name]. Per son he lives in [place] and patient lives alone in [place]. [place] explained SNF process and that medicare requires a 3 night qualifying inpatient stay in the hospital in order to pay for SNF. Patient was admitted to inpatient 03/23/18. Son is agreeable to SNF search in [place]. FL2 complete and faxed out. CSW will continue to follow and assist as needed.   Employment status:  Retired Forensic scientist:  [employer] PT Recommendations:  [place] / Referral to community resources:  [place]  Patient/Family's Response to care:  Patient's son is agreeable to [employer] in [place].   Patient/Family's Understanding of and  Emotional Response to Diagnosis, Current Treatment, and Prognosis:  Patient's son was very pleasant and thanked CSW for assistance.   Emotional Assessment Appearance:  Appears stated age Attitude/Demeanor/Rapport:  Unable to Assess Affect (typically observed):  Unable to Assess Orientation:  Oriented to Self, Oriented to Place, Oriented to  Time, Oriented to Situation Alcohol / Substance use:  Not Applicable Psych involvement (Current and /or in the community):  No (Comment)  Discharge Needs  Concerns to be addressed:  Discharge Planning Concerns Readmission within the last 30 days:  No Current discharge risk:  Dependent with Mobility Barriers to Discharge:  Continued Medical Work up   [employer], Veronia Beets, LCSW 03/24/2018, 5:01 PM

## 2018-03-24 NOTE — Anesthesia Procedure Notes (Signed)
Procedure Name: Intubation Date/Time: 03/24/2018 11:38 AM Performed by: Dionne Bucy, CRNA Pre-anesthesia Checklist: Patient identified, Patient being monitored, Timeout performed, Emergency Drugs available and Suction available Patient Re-evaluated:Patient Re-evaluated prior to induction Oxygen Delivery Method: Circle system utilized Preoxygenation: Pre-oxygenation with 100% oxygen Induction Type: IV induction Ventilation: Mask ventilation without difficulty Laryngoscope Size: 3 and McGraph Grade View: Grade I Tube type: Oral Tube size: 7.0 mm Number of attempts: 1 Airway Equipment and Method: Stylet Placement Confirmation: ETT inserted through vocal cords under direct vision,  positive ETCO2 and breath sounds checked- equal and bilateral Secured at: 20 cm Tube secured with: Tape Dental Injury: Teeth and Oropharynx as per pre-operative assessment

## 2018-03-24 NOTE — Progress Notes (Signed)
Discussed the surgery with patient and her son.  Risks, benefits, alternatives discussed.  I think she would do better with ORIF and will plan on that this morning

## 2018-03-24 NOTE — Evaluation (Signed)
Physical Therapy Evaluation Patient Details Name: Tina [name] MRN: [id number] DOB: 07-18-1927 Today's Date: 03/24/2018   History of Present Illness  84 yo female with fall in her home off the porch was wearing new shoes that her daughter disapproved of to PT.  Suffered B wrist fractures with R radial surgical repair and ulnar fracture nondisplaced; L radial surgically repaired impacted fracture.  PMHx:  anxiety, kyphoplasty, shingles, dementia, macular degeneration, PVD  Clinical Impression  Pt was seen for evaluation of her new mobility challenge with difficulty standing and balancing with the BUE fracutres.  Pt was taken to OR for ORIF in B wrists, and will recommend SNF to follow up as she is unsafe, attempting to challenge physical limitations.  Her plan is to provide a platform walker to pt to work on her posture, to increase engagement of core and LE's and to increase safe awareness of postural mm's.  Follow acutely for same.    Follow Up Recommendations Supervision/Assistance - 24 hour;Supervision for mobility/OOB    Equipment Recommendations       Recommendations for Other Services OT consult     Precautions / Restrictions Precautions Precautions: Fall Precaution Comments: NWB B wrists Required Braces or Orthoses: (platform walker) Restrictions Weight Bearing Restrictions: Yes LUE Weight Bearing: Non weight bearing Other Position/Activity Restrictions: Platform attachments only on walker      Mobility  Bed Mobility               General bed mobility comments: reminders for avoiding use of B hands to stand up  Transfers Overall transfer level: Needs assistance Equipment used: None(gait belt) Transfers: Sit to/from Stand Sit to Stand: Min guard         General transfer comment: 100% cued transfers  Ambulation/Gait Ambulation/Gait assistance: Min guard;Min assist Gait Distance (Feet): 40 Feet Assistive device: 1 person hand held assist Gait  Pattern/deviations: Step-to pattern;Step-through pattern;Decreased stride length;Trunk flexed Gait velocity: reduced Gait velocity interpretation: <1.8 ft/sec, indicate of risk for recurrent falls General Gait Details: Pt has restricted tolerances for gait and position;  low endurance  Stairs            Wheelchair Mobility    Modified Rankin (Stroke Patients Only)       Balance Overall balance assessment: Needs assistance Sitting-balance support: Feet supported Sitting balance-Leahy Scale: Fair       Standing balance-Leahy Scale: Poor                               Pertinent Vitals/Pain Pain Assessment: Faces Faces Pain Scale: Hurts a little bit Pain Location: B arms Pain Intervention(s): Limited activity within patient's tolerance    Home Living Family/patient expects to be discharged to:: Private residence Living Arrangements: Alone Available Help at Discharge: Family;Available PRN/intermittently Type of Home: House Home Access: Stairs to enter   [employer] of Steps: 2 Home Layout: One level Home Equipment: Environmental consultant - 2 wheels Additional Comments: family assists with history    Prior Function Level of Independence: Independent with assistive device(s)         Comments: used RW previously or no AD depending     Hand Dominance   Dominant Hand: Right    Extremity/Trunk Assessment   Upper Extremity Assessment Upper Extremity Assessment: Overall WFL for tasks assessed    Lower Extremity Assessment Lower Extremity Assessment: Generalized weakness    Cervical / Trunk Assessment Cervical / Trunk Assessment: Kyphotic  Communication  Communication: HOH  Cognition Arousal/Alertness: Awake/alert Behavior During Therapy: Impulsive Overall Cognitive Status: History of cognitive impairments - at baseline                                 General Comments: pt does not fully recall her injury      General Comments  General comments (skin integrity, edema, etc.): pt was supported on her LUE and avoided WB through the hands    Exercises     Assessment/Plan    PT Assessment Patient needs continued PT services  PT Problem List Decreased strength;Decreased range of motion;Decreased balance;Decreased activity tolerance;Decreased mobility;Decreased coordination;Decreased knowledge of use of DME;Decreased safety awareness;Decreased knowledge of precautions;Impaired tone       PT Treatment Interventions DME instruction;Gait training;Functional mobility training;Therapeutic activities;Therapeutic exercise;Balance training;Neuromuscular re-education;Patient/family education    PT Goals (Current goals can be found in the Care Plan section)  Acute Rehab PT Goals Patient Stated Goal: to walk to BR PT Goal Formulation: With patient/family Time For Goal Achievement: 04/07/18 Potential to Achieve Goals: Good    Frequency Min 2X/week   Barriers to discharge Decreased caregiver support home alone with family in PRN    Co-evaluation               AM-PAC PT "6 Clicks" Daily Activity  Outcome Measure Difficulty turning over in bed (including adjusting bedclothes, sheets and blankets)?: A Little Difficulty moving from lying on back to sitting on the side of the bed? : Unable Difficulty sitting down on and standing up from a chair with arms (e.g., wheelchair, bedside commode, etc,.)?: Unable Help needed moving to and from a bed to chair (including a wheelchair)?: A Little Help needed walking in hospital room?: A Little Help needed climbing 3-5 steps with a railing? : A Lot 6 Click Score: 13    End of Session Equipment Utilized During Treatment: Gait belt Activity Tolerance: Patient tolerated treatment well Patient left: in bed;with call bell/phone within reach;with bed alarm set;with family/visitor present Nurse Communication: Mobility status PT Visit Diagnosis: Unsteadiness on feet (R26.81);Muscle  weakness (generalized) (M62.81);History of falling (Z91.81)    Time: 2549-8264 PT Time Calculation (min) (ACUTE ONLY): 29 min   Charges:   PT Evaluation $PT Eval Moderate Complexity: 1 Mod PT Treatments $Gait Training: 8-22 mins       Ramond Dial 03/24/2018, 9:23 PM   Mee Hives, PT MS Acute Rehab Dept. Number: [place] and [place]

## 2018-03-24 NOTE — Anesthesia Post-op Follow-up Note (Signed)
Anesthesia QCDR form completed.        

## 2018-03-24 NOTE — Transfer of Care (Signed)
Immediate Anesthesia Transfer of Care Note  Patient: Tina [name]  Procedure(s) Performed: OPEN REDUCTION INTERNAL FIXATION (ORIF) WRIST FRACTURE (Bilateral )  Patient Location: PACU  Anesthesia Type:General  Level of Consciousness: sedated  Airway & Oxygen Therapy: Patient Spontanous Breathing and Patient connected to face mask oxygen  Post-op Assessment: Report given to RN and Post -op Vital signs reviewed and stable  Post vital signs: Reviewed  Last Vitals:  Vitals Value Taken Time  BP 148/82 03/24/2018  1:12 PM  Temp    Pulse 87 03/24/2018  1:12 PM  Resp 14 03/24/2018  1:12 PM  SpO2 96 % 03/24/2018  1:12 PM    Last Pain:  Vitals:   03/24/18 1003  TempSrc: Tympanic  PainSc:          Complications: No apparent anesthesia complications

## 2018-03-24 NOTE — Clinical Social Work Placement (Signed)
   CLINICAL SOCIAL WORK PLACEMENT  NOTE  Date:  03/24/2018  Patient Details  Name: Tina [name] MRN: [id number] Date of Birth: 08/31/1927  Clinical Social Work is seeking post-discharge placement for this patient at the [place] level of care (*CSW will initial, date and re-position this form in  chart as items are completed):  Yes   Patient/family provided with [facility] Work Department's list of facilities offering this level of care within the geographic area requested by the patient (or if unable, by the patient's family).  Yes   Patient/family informed of their freedom to choose among providers that offer the needed level of care, that participate in Medicare, Medicaid or managed care program needed by the patient, have an available bed and are willing to accept the patient.  Yes   Patient/family informed of [facility]'s ownership interest in [facility] and [facility], as well as of the fact that they are under no obligation to receive care at these facilities.  PASRR submitted to EDS on 03/24/18     PASRR number received on 03/24/18     Existing PASRR number confirmed on       FL2 transmitted to all facilities in geographic area requested by pt/family on 03/24/18     FL2 transmitted to all facilities within larger geographic area on       Patient informed that his/her managed care company has contracts with or will negotiate with certain facilities, including the following:            Patient/family informed of bed offers received.  Patient chooses bed at       Physician recommends and patient chooses bed at      Patient to be transferred to   on  .  Patient to be transferred to facility by       Patient family notified on   of transfer.  Name of family member notified:        PHYSICIAN       Additional Comment:    _______________________________________________ Cordarro Spinnato, Veronia Beets, LCSW 03/24/2018, 5:00 PM

## 2018-03-24 NOTE — Consult Note (Signed)
ORTHOPAEDIC CONSULTATION  PATIENT NAME: Tina [name] DOB: 1927-08-01  MRN: [id number]  REQUESTING PHYSICIAN: Henreitta Leber, MD  Chief Complaint: Bilateral wrist pain  HPI: Tina [name] is a 84 y.o. right-hand-dominant female who complains of bilateral wrist pain, right more severe than left.  The patient sustained an unwitnessed fall last night at home fell on her outstretched hands.  He does not recall the exact mechanism of injury.  She denies any other injuries.  She denies any loss of consciousness.  She denies any gross numbness to the hands.  Past Medical History:  Diagnosis Date  . Anxiety   . Cancer ([place])    breast  . Dementia   . Hypothyroidism   . Shingles    Past Surgical History:  Procedure Laterality Date  . APPENDECTOMY    . BREAST SURGERY    . EYE SURGERY    . KYPHOPLASTY N/A 06/27/2015   Procedure: KYPHOPLASTY L4;  Surgeon: Hessie Knows, MD;  Location: [facility] ORS;  Service: Orthopedics;  Laterality: N/A;   Social History   Socioeconomic History  . Marital status: Widowed    Spouse name: Not on file  . Number of children: Not on file  . Years of education: Not on file  . Highest education level: Not on file  Occupational History  . Not on file  Social Needs  . Financial resource strain: Not on file  . Food insecurity:    Worry: Not on file    Inability: Not on file  . Transportation needs:    Medical: Not on file    Non-medical: Not on file  Tobacco Use  . Smoking status: Current Every Day Smoker    Packs/day: 0.50  . Smokeless tobacco: Never Used  Substance and Sexual Activity  . Alcohol use: No  . Drug use: No  . Sexual activity: Not on file  Lifestyle  . Physical activity:    Days per week: Not on file    Minutes per session: Not on file  . Stress: Not on file  Relationships  . Social connections:    Talks on phone: Not on file    Gets together: Not on file    Attends religious service: Not on file    Active member of club or  organization: Not on file    Attends meetings of clubs or organizations: Not on file    Relationship status: Not on file  Other Topics Concern  . Not on file  Social History Narrative  . Not on file   Family History  Problem Relation Age of Onset  . CAD Mother   . CAD Father    Allergies  Allergen Reactions  . Nsaids Other (See Comments)    H/O GI BLEED  . Meloxicam     Other reaction(s): Other (See Comments) Upper GI Bleeding   Prior to Admission medications   Medication Sig Start Date End Date Taking? Authorizing Provider  acetaminophen (TYLENOL) 325 MG tablet Take 2 tablets (650 mg total) by mouth every 6 (six) hours as needed for mild pain (or Fever >/= 101). 05/18/17   Nicholes Mango, MD  alendronate (FOSAMAX) 70 MG tablet Take 1 tablet once a week by mouth. 02/12/17   [provider]  aspirin EC 81 MG tablet Take 1 tablet (81 mg total) by mouth daily. 05/18/17 05/18/18  Nicholes Mango, MD  Calcium 500-100 MG-UNIT CHEW Chew 1 tablet 2 (two) times daily by mouth.    [provider]  citalopram (CELEXA) 10 MG tablet Take 10 mg by mouth daily.    [provider]  dicyclomine (BENTYL) 20 MG tablet Take 20 mg by mouth every 6 (six) hours as needed. 03/09/18   [provider]  docusate sodium (COLACE) 100 MG capsule Take 1 capsule (100 mg total) by mouth 2 (two) times daily as needed for mild constipation. 05/18/17   Gouru, Illene Silver, MD  donepezil (ARICEPT) 10 MG tablet Take 10 mg by mouth every morning.    [provider]  feeding supplement, ENSURE ENLIVE, (ENSURE ENLIVE) LIQD Take 237 mLs by mouth 2 (two) times daily between meals. 05/18/17   Gouru, Illene Silver, MD  fluticasone (FLONASE) 50 MCG/ACT nasal spray Place 2 sprays into both nostrils daily.    [provider]  gabapentin (NEURONTIN) 300 MG capsule Take 600 mg 3 (three) times daily by mouth.     [provider]  levothyroxine (SYNTHROID, LEVOTHROID) 112 MCG tablet Take 112  mcg by mouth daily before breakfast.     [provider]  Multiple Vitamin (MULTIVITAMIN) tablet Take 1 tablet by mouth daily.    [provider]  Multiple Vitamins-Minerals (ICAPS AREDS 2) CAPS Take 1 capsule by mouth 2 (two) times daily.    [provider]  pantoprazole (PROTONIX) 40 MG tablet Take 1 tablet (40 mg total) by mouth 2 (two) times daily. 06/23/17   Virgel Manifold, MD  ursodiol (ACTIGALL) 250 MG tablet TAKE 1 TABLET(250 MG) BY MOUTH THREE TIMES DAILY WITH MEALS 12/14/17   Virgel Manifold, MD   Dg Wrist Complete Left  Result Date: 03/23/2018 CLINICAL DATA:  Unwitnessed fall.  Bilateral forearm pain, swelling EXAM: LEFT WRIST - COMPLETE 3+ VIEW COMPARISON:  None. FINDINGS: There is a mildly impacted fracture in the distal left radius. No visible ulnar abnormality. No subluxation or dislocation. IMPRESSION: Mildly impacted distal left radial fracture. Electronically Signed   By: Rolm Baptise M.D.   On: 03/23/2018 18:40   Dg Wrist Complete Right  Result Date: 03/23/2018 CLINICAL DATA:  Status post splint placement. EXAM: RIGHT WRIST - COMPLETE 3+ VIEW COMPARISON:  RIGHT wrist radiograph March 23, 2018 at 1821 hours FINDINGS: Acute distal radial fracture with impaction, dorsal angulation distal bony fracture improved from prior imaging. Acute displaced ulnar styloid fracture. No dislocation. Osteopenia. Soft tissue swelling. Thick fiberglass cast obscures the fine bony detail. IMPRESSION: Fiberglass splint transfixing acute displaced distal radial fracture in improved alignment and displaced ulnar styloid fracture. Electronically Signed   By: Elon Alas M.D.   On: 03/23/2018 19:59   Dg Wrist Complete Right  Result Date: 03/23/2018 CLINICAL DATA:  Fall, right wrist deformity and pain EXAM: RIGHT WRIST - COMPLETE 3+ VIEW COMPARISON:  03/23/2018 FINDINGS: Acute dorsally displaced fractures of the right distal radius and ulna. Soft tissue swelling  noted. Bones are osteopenic. Carpal bones appear intact. IMPRESSION: Acute dorsally displaced distal radius and ulna fractures. Osteopenia Electronically Signed   By: Jerilynn Mages.  Shick M.D.   On: 03/23/2018 18:41    Positive ROS: All other systems have been reviewed and were otherwise negative with the exception of those mentioned in the HPI and as above.  Physical Exam: General: Well developed, well nourished female seen in no acute distress. HEENT: Atraumatic and normocephalic. Sclera are clear. Extraocular motion is intact. Oropharynx is clear with moist mucosa. Neck: Supple, nontender, good range of motion. No JVD or carotid bruits. Lungs: Clear to auscultation bilaterally. Cardiovascular: Regular rate and rhythm with normal S1 and  S2. No murmurs. No gallops or rubs.  Abdomen: Soft, nontender, and nondistended. Bowel sounds are present. Skin: No lesions in the area of chief complaint Neurologic: Awake, alert, and oriented. Sensory function is grossly intact. Motor strength is felt to be 5 over 5 bilaterally. No clonus or tremor. Good motor coordination. Lymphatic: No axillary or cervical lymphadenopathy  MUSCULOSKELETAL: Examination of both wrists demonstrate short arm splints intact.  Good range of motion of the digits.  Good capillary refill.  There is tenderness to palpation along both wrists.  Good range of motion of the elbows.  Assessment: Comminuted, displaced right distal radius fracture Displaced left distal radius fracture  Plan: The findings were discussed with the patient.  Given the degree of comminution and displacement, I would recommend open reduction internal fixation of the fractures.  The patient's son is her power of attorney.  The there is apparently returning the later this morning.  I will review the situation with the patient's son for proceeding with any surgical intervention.  Serine Kea P. Holley Bouche M.D.

## 2018-03-24 NOTE — Op Note (Signed)
03/24/2018  1:01 PM  PATIENT:  Tina [name]  84 y.o. female  PRE-OPERATIVE DIAGNOSIS:  bilateral wrist fractures  POST-OPERATIVE DIAGNOSIS:  bilateral wrist fractures  PROCEDURE:  Procedure(s): OPEN REDUCTION INTERNAL FIXATION (ORIF) WRIST FRACTURE (Bilateral)  SURGEON: Laurene Footman, MD  ASSISTANTS: None  ANESTHESIA:   general  EBL:  Total I/O In: 400 [I.V.:400] Out: -   BLOOD ADMINISTERED:none  DRAINS: none   LOCAL MEDICATIONS USED:  NONE  SPECIMEN:  No Specimen  DISPOSITION OF SPECIMEN:  N/A  COUNTS:  YES  TOURNIQUET: 19 minutes on the right, 13 minutes on the left at 250 mmHg  IMPLANTS: [employer] short narrow right and left plates with multiple smooth pegs and screws  DICTATION: .Dragon Dictation she was brought to the operating room and after adequate general anesthesia was obtained the right arm was prepped and draped in usual sterile fashion.  After patient identification and timeout procedures were completed the tourniquet with that was placed just beneath the elbow was raised.  A volar approach was made over the FCR tendon and the tendon sheath incised.  The fracture was quite comminuted and with fingertrap traction length was restored.  The fracture was held in a reduced position and a volar plate applied.  Multiple smooth pegs were placed after fixing the plate to the bone with a K wire followed by 3 cortical screws in the shaft.  The mini C-arm was utilized to assess alignment and volar tilt length and radial inclination were restored and there is no penetration into the joint.  After traction was removed under fluoroscopic view the fracture appeared stable there was very comminuted and the area between the distal articular surface in the shaft but the fracture did not appear to be intra-articular.  The wound was then thoroughly irrigated and tourniquet let down.  3-0 Vicryl to close the subcutaneous skin followed by 4-0 nylon for the skin.  Xeroform 4 x  4's web roll applied.  The left arm was then prepped and draped you sterile fashion in identical procedure carried out with the fracture getting length restored with the traction and they did not have the comminution present on the other side.  The left-sided plate was applied with multiple smooth pegs distally 3 cortical screws and again stable through range of motion without penetration into the joint.  The wound was irrigated and tourniquet let down the wound was closed in identical fashion with Xeroform 4 x 4's web roll and I will splint applied to both wrists with Ace wraps  PLAN OF CARE: Continue as inpatient  PATIENT DISPOSITION:  PACU - hemodynamically stable.

## 2018-03-24 NOTE — NC FL2 (Signed)
[place] LEVEL OF CARE SCREENING TOOL     IDENTIFICATION  Patient Name: Tina [name] Birthdate: Oct 22, 1927 Sex: female Admission Date (Current Location): 03/23/2018  [place] and [place] Number:  Engineering geologist and Address:  [facility], [address], [place], [place] [address]      Provider Number: [id number]  Attending Physician Name and Address:  Henreitta Leber, MD  Relative Name and Phone Number:       Current Level of Care: Hospital Recommended Level of Care: [place] Prior Approval Number:    Date Approved/Denied:   PASRR Number: ([id number])  Discharge Plan: SNF    Current Diagnoses: Patient Active Problem List   Diagnosis Date Noted  . Wrist fracture, bilateral 03/23/2018  . Dementia in Alzheimer's disease 05/24/2017  . GI bleed 05/15/2017  . Acute blood loss anemia 05/15/2017  . Acute encephalopathy 05/15/2017  . Tachycardia 05/15/2017  . AMD (age related macular degeneration) 01/03/2015  . Allergic rhinitis 12/12/2013  . Depressive disorder, not elsewhere classified 12/12/2013  . Hypothyroidism 12/12/2013  . Postherpetic neuralgia 12/12/2013    Orientation RESPIRATION BLADDER Height & Weight     Self, Time, Situation, Place  O2(3 Liters Oxygen. ) Continent Weight: 150 lb (68 kg) Height:  5' (152.4 cm)  BEHAVIORAL SYMPTOMS/MOOD NEUROLOGICAL BOWEL NUTRITION STATUS      Continent Diet(Diet: NPO for surgery to be advanced. )  AMBULATORY STATUS COMMUNICATION OF NEEDS Skin   Extensive Assist Verbally Surgical wounds(Incision: bilateral wrist fractures)                       Personal Care Assistance Level of Assistance  Bathing, Feeding, Dressing Bathing Assistance: Limited assistance Feeding assistance: Independent Dressing Assistance: Limited assistance     Functional Limitations Info  Sight, Hearing, Speech Sight Info: Adequate Hearing Info: Adequate Speech Info: Adequate     SPECIAL CARE FACTORS FREQUENCY  PT (By licensed PT), OT (By licensed OT)     PT Frequency: (5) OT Frequency: (5)            Contractures      Additional Factors Info  Code Status, Allergies Code Status Info: (Full Code. ) Allergies Info: (Nsaids, Meloxicam)           Current Medications (03/24/2018):  This is the current hospital active medication list Current Facility-Administered Medications  Medication Dose Route Frequency Provider Last Rate Last Dose  . 0.9 %  sodium chloride infusion   Intravenous Continuous Loletha Grayer, MD 40 mL/hr at 03/24/18 1420    . [MAR Hold] acetaminophen (TYLENOL) tablet 650 mg  650 mg Oral Q6H PRN Loletha Grayer, MD       Or  . Doug Sou Hold] acetaminophen (TYLENOL) suppository 650 mg  650 mg Rectal Q6H PRN Loletha Grayer, MD      . Doug Sou Hold] calcium-vitamin D (OSCAL WITH D) 500-200 MG-UNIT per tablet 1 tablet  1 tablet Oral BID Loletha Grayer, MD   1 tablet at 03/23/18 2212  . [MAR Hold] citalopram (CELEXA) tablet 10 mg  10 mg Oral Daily Wieting, Richard, MD      . Doug Sou Hold] donepezil (ARICEPT) tablet 10 mg  10 mg Oral q morning - 10a Wieting, Richard, MD      . famotidine (PEPCID) 20 MG tablet           . fentaNYL (SUBLIMAZE) 100 MCG/2ML injection           . fentaNYL (  SUBLIMAZE) injection 25 mcg  25 mcg Intravenous Q5 min PRN Alvin Critchley, MD   25 mcg at 03/24/18 1349  . [MAR Hold] fluticasone (FLONASE) 50 MCG/ACT nasal spray 2 spray  2 spray Each Nare Daily Wieting, Richard, MD      . Doug Sou Hold] gabapentin (NEURONTIN) capsule 600 mg  600 mg Oral TID Loletha Grayer, MD   600 mg at 03/23/18 2212  . [MAR Hold] levothyroxine (SYNTHROID, LEVOTHROID) tablet 100 mcg  100 mcg Oral QAC breakfast Wieting, Richard, MD      . Doug Sou Hold] multivitamin with minerals tablet 1 tablet  1 tablet Oral Daily Wieting, Richard, MD      . Doug Sou Hold] nicotine (NICODERM CQ - dosed in mg/24 hr) patch 7 mg  7 mg Transdermal Daily Loletha Grayer, MD   7  mg at 03/23/18 2212  . [MAR Hold] ondansetron (ZOFRAN) tablet 4 mg  4 mg Oral Q6H PRN Loletha Grayer, MD       Or  . Doug Sou Hold] ondansetron [facility]) injection 4 mg  4 mg Intravenous Q6H PRN Wieting, Richard, MD      . ondansetron [facility]) injection 4 mg  4 mg Intravenous Once PRN Alvin Critchley, MD      . Doug Sou Hold] oxyCODONE (Oxy IR/ROXICODONE) immediate release tablet 5 mg  5 mg Oral Q4H PRN Loletha Grayer, MD   5 mg at 03/24/18 0538  . [MAR Hold] pantoprazole (PROTONIX) EC tablet 40 mg  40 mg Oral BID Loletha Grayer, MD   40 mg at 03/23/18 2212  . [MAR Hold] ursodiol (ACTIGALL) capsule 300 mg  300 mg Oral BID Loletha Grayer, MD   300 mg at 03/23/18 2212     Discharge Medications: Please see discharge summary for a list of discharge medications.  Relevant Imaging Results:  Relevant Lab Results:   Additional Information (SSN: [id number])  Kenny Rea, Veronia Beets, LCSW

## 2018-03-25 ENCOUNTER — Encounter: Payer: Self-pay | Admitting: Orthopedic Surgery

## 2018-03-25 MED ORDER — OXYCODONE HCL 5 MG PO TABS: 5.0000 mg | ORAL_TABLET | ORAL | 0 refills | Status: DC | PRN

## 2018-03-25 NOTE — Progress Notes (Signed)
Physical Therapy Treatment Patient Details Name: Tina [name] MRN: [id number] DOB: [birth year] Today's Date: 03/25/2018    History of Present Illness 84 yo female with fall in her home off the porch was wearing new shoes that her daughter disapproved of to PT.  Suffered B wrist fractures with R radial surgical repair and ulnar fracture nondisplaced; L radial surgically repaired impacted fracture.  PMHx:  anxiety, kyphoplasty, shingles, dementia, macular degeneration, PVD    PT Comments    [name] was very pleasant and agreeable to therapy.  She requires cues to adhere to NWB BUEs with bed mobility and ambulation.  Pt remains unsteady when ambulating, requiring min assist to prevent LOB and fall.  Given pt's current mobility status, SNF remains most appropriate d/c plan at this time.     Follow Up Recommendations  SNF     Equipment Recommendations  Other (comment)(TBD at next venue of care)    Recommendations for Other Services OT consult     Precautions / Restrictions Precautions Precautions: Fall Restrictions Weight Bearing Restrictions: Yes RUE Weight Bearing: Non weight bearing LUE Weight Bearing: Non weight bearing Other Position/Activity Restrictions: Platform attachments on walker although did not need to use this session    Mobility  Bed Mobility Overal bed mobility: Needs Assistance Bed Mobility: Supine to Sit;Sit to Supine     Supine to sit: HOB elevated;Min assist Sit to supine: Min assist   General bed mobility comments: Pt requires 1 verbal cue to remember not to push through UE, otherwise pt pushing through Bil elbows but ultimately does require min assist to elevate trunk due to poor core strength.  Pt requires assist with LEs for sit>supine.   Transfers Overall transfer level: Needs assistance Equipment used: None Transfers: Sit to/from Stand Sit to Stand: Min guard         General transfer comment: Pt mildly unsteady but no  LOB  Ambulation/Gait Ambulation/Gait assistance: Min assist Gait Distance (Feet): 80 Feet Assistive device: None Gait Pattern/deviations: Step-through pattern;Decreased stride length;Narrow base of support Gait velocity: decreased   General Gait Details: Pt with narrow BOS leading to instability and intermittently requiring min assist to steady.  Cues to not reach out for IV pole to adhere to NWB status.    Stairs             Wheelchair Mobility    Modified Rankin (Stroke Patients Only)       Balance Overall balance assessment: Needs assistance Sitting-balance support: Feet supported Sitting balance-Leahy Scale: Fair     Standing balance support: No upper extremity supported;During functional activity Standing balance-Leahy Scale: Poor Standing balance comment: Pt relies on intermittent min assist to remain steady when ambulating                            Cognition Arousal/Alertness: Awake/alert Behavior During Therapy: WFL for tasks assessed/performed Overall Cognitive Status: History of cognitive impairments - at baseline                                        Exercises Other Exercises Other Exercises: Opening and closing fingers Bil in elevated position to decrease swelling, encouraged pt to do this every 30 minutes for ~1 minute    General Comments General comments (skin integrity, edema, etc.): Son present during session      Pertinent Vitals/Pain Pain Assessment:  Faces Faces Pain Scale: Hurts little more Pain Location: Bil wrists/hands Pain Descriptors / Indicators: Grimacing;Guarding Pain Intervention(s): Limited activity within patient's tolerance;Monitored during session;Repositioned    Home Living                      Prior Function            PT Goals (current goals can now be found in the care plan section) Acute Rehab PT Goals Patient Stated Goal: decreased pain PT Goal Formulation: With  patient/family Time For Goal Achievement: 04/07/18 Potential to Achieve Goals: Good Progress towards PT goals: Progressing toward goals    Frequency    BID      PT Plan Current plan remains appropriate    Co-evaluation              AM-PAC PT "6 Clicks" Daily Activity  Outcome Measure  Difficulty turning over in bed (including adjusting bedclothes, sheets and blankets)?: Unable Difficulty moving from lying on back to sitting on the side of the bed? : Unable Difficulty sitting down on and standing up from a chair with arms (e.g., wheelchair, bedside commode, etc,.)?: Unable Help needed moving to and from a bed to chair (including a wheelchair)?: A Little Help needed walking in hospital room?: A Little Help needed climbing 3-5 steps with a railing? : A Lot 6 Click Score: 11    End of Session Equipment Utilized During Treatment: Gait belt Activity Tolerance: Patient tolerated treatment well Patient left: with call bell/phone within reach;with family/visitor present;Other (comment);in bed;with bed alarm set;with SCD's reapplied(BUEs elevated on multiple pillows) Nurse Communication: Mobility status PT Visit Diagnosis: Unsteadiness on feet (R26.81);Muscle weakness (generalized) (M62.81);History of falling (Z91.81)     Time: 9211-9417 PT Time Calculation (min) (ACUTE ONLY): 19 min  Charges:  $Gait Training: 8-22 mins                     Collie Siad PT, DPT 03/25/2018, 4:03 PM

## 2018-03-25 NOTE — Anesthesia Postprocedure Evaluation (Signed)
Anesthesia Post Note  Patient: Tina [name]  Procedure(s) Performed: OPEN REDUCTION INTERNAL FIXATION (ORIF) WRIST FRACTURE (Bilateral )  Patient location during evaluation: PACU Anesthesia Type: General Level of consciousness: awake and alert and oriented Pain management: pain level controlled Vital Signs Assessment: post-procedure vital signs reviewed and stable Respiratory status: spontaneous breathing Cardiovascular status: blood pressure returned to baseline Anesthetic complications: no     Last Vitals:  Vitals:   03/25/18 0357 03/25/18 0832  BP: (!) 110/54 (!) 142/59  Pulse: 82 86  Resp: 18 18  Temp: 36.6 C 37 C  SpO2: 95% 95%    Last Pain:  Vitals:   03/25/18 1117  TempSrc:   PainSc: Asleep                 Geraldine Sandberg

## 2018-03-25 NOTE — Progress Notes (Signed)
[facility] [place] NAME: Tina [name]    MR#:  [id number]  DATE OF BIRTH:  24-May-1928  SUBJECTIVE:   Patient here after a mechanical fall and noted to have bilateral wrist fractures.  Status post ORIF of bilateral wrist fractures.  Patient still having significant pain in bilateral wrist/hand secondary to the surgery.  Await PT eval.   REVIEW OF SYSTEMS:    Review of Systems  Constitutional: Negative for chills and fever.  HENT: Negative for congestion and tinnitus.   Eyes: Negative for blurred vision and double vision.  Respiratory: Negative for cough, shortness of breath and wheezing.   Cardiovascular: Negative for chest pain, orthopnea and PND.  Gastrointestinal: Negative for abdominal pain, diarrhea, nausea and vomiting.  Genitourinary: Negative for dysuria and hematuria.  Musculoskeletal: Positive for falls and joint pain (b/l Wrists).  Neurological: Negative for dizziness, sensory change and focal weakness.  All other systems reviewed and are negative.   Nutrition: Regular Tolerating Diet: yes Tolerating PT: Await Eval.   DRUG ALLERGIES:   Allergies  Allergen Reactions  . Nsaids Other (See Comments)    H/O GI BLEED  . Meloxicam     Other reaction(s): Other (See Comments) Upper GI Bleeding    VITALS:  Blood pressure (!) 103/57, pulse 85, temperature 98.2 F (36.8 C), temperature source Oral, resp. rate 18, height 5' (1.524 m), weight 68 kg, SpO2 93 %.  PHYSICAL EXAMINATION:   Physical Exam  GENERAL:  84 y.o.-year-old thin patient lying in bed in NAD.  EYES: Pupils equal, round, reactive to light and accommodation. No scleral icterus. Extraocular muscles intact.  HEENT: Head atraumatic, normocephalic. Oropharynx and nasopharynx clear.  NECK:  Supple, no jugular venous distention. No thyroid enlargement, no tenderness.  LUNGS: Normal breath sounds bilaterally, no wheezing, rales, rhonchi. No use of accessory muscles of  respiration.  CARDIOVASCULAR: S1, S2 normal. No murmurs, rubs, or gallops.  ABDOMEN: Soft, nontender, nondistended. Bowel sounds present. No organomegaly or mass.  EXTREMITIES: No cyanosis, clubbing or edema b/l.   B/l Wrist/hand dressings in place from surgery NEUROLOGIC: Cranial nerves II through XII are intact. No focal Motor or sensory deficits b/l.   PSYCHIATRIC: The patient is alert and oriented x 3.  SKIN: No obvious rash, lesion, or ulcer.    LABORATORY PANEL:   CBC Recent Labs  Lab 03/24/18 0605  WBC 10.4  HGB 11.4*  HCT 32.9*  PLT 163   ------------------------------------------------------------------------------------------------------------------  Chemistries  Recent Labs  Lab 03/24/18 0605  NA 139  K 3.9  CL 107  CO2 26  GLUCOSE 90  BUN 6*  CREATININE 0.76  CALCIUM 8.0*   ------------------------------------------------------------------------------------------------------------------  Cardiac Enzymes Recent Labs  Lab 03/23/18 1855  TROPONINI <0.03   ------------------------------------------------------------------------------------------------------------------  RADIOLOGY:  Dg Wrist 2 Views Left  Result Date: 03/24/2018 CLINICAL DATA:  Post ORIF of left wrist fracture EXAM: LEFT WRIST - 2 VIEW COMPARISON:  Left wrist films of 03/23/2017 FINDINGS: The impacted fracture of the distal left radius has been stabilized with placement of fixation plate and screws. Anatomic alignment is present. No complicating features are seen. IMPRESSION: ORIF of transverse impacted distal left radial fracture. Electronically Signed   By: Ivar Drape M.D.   On: 03/24/2018 14:07   Dg Wrist 2 Views Right  Result Date: 03/24/2018 CLINICAL DATA:  ORIF. EXAM: RIGHT WRIST - 2 VIEW COMPARISON:  03/23/2018 FINDINGS: Examination demonstrates a volar fixation plate with screws bridging patient's distal radial fracture as  hardware is intact with anatomic alignment over the fracture  site. Continued evidence of patient's distal displaced ulnar fracture. Remainder of the exam is unchanged. IMPRESSION: Fixation patient's distal radial fracture with hardware intact and anatomic alignment over the fracture site. No change in patient's nondisplaced distal ulnar fracture. Electronically Signed   By: Marin Olp M.D.   On: 03/24/2018 13:53   Dg Wrist Complete Left  Result Date: 03/23/2018 CLINICAL DATA:  Unwitnessed fall.  Bilateral forearm pain, swelling EXAM: LEFT WRIST - COMPLETE 3+ VIEW COMPARISON:  None. FINDINGS: There is a mildly impacted fracture in the distal left radius. No visible ulnar abnormality. No subluxation or dislocation. IMPRESSION: Mildly impacted distal left radial fracture. Electronically Signed   By: Rolm Baptise M.D.   On: 03/23/2018 18:40   Dg Wrist Complete Right  Result Date: 03/23/2018 CLINICAL DATA:  Status post splint placement. EXAM: RIGHT WRIST - COMPLETE 3+ VIEW COMPARISON:  RIGHT wrist radiograph March 23, 2018 at 1821 hours FINDINGS: Acute distal radial fracture with impaction, dorsal angulation distal bony fracture improved from prior imaging. Acute displaced ulnar styloid fracture. No dislocation. Osteopenia. Soft tissue swelling. Thick fiberglass cast obscures the fine bony detail. IMPRESSION: Fiberglass splint transfixing acute displaced distal radial fracture in improved alignment and displaced ulnar styloid fracture. Electronically Signed   By: Elon Alas M.D.   On: 03/23/2018 19:59   Dg Wrist Complete Right  Result Date: 03/23/2018 CLINICAL DATA:  Fall, right wrist deformity and pain EXAM: RIGHT WRIST - COMPLETE 3+ VIEW COMPARISON:  03/23/2018 FINDINGS: Acute dorsally displaced fractures of the right distal radius and ulna. Soft tissue swelling noted. Bones are osteopenic. Carpal bones appear intact. IMPRESSION: Acute dorsally displaced distal radius and ulna fractures. Osteopenia Electronically Signed   By: Jerilynn Mages.  Shick M.D.   On:  03/23/2018 18:41     ASSESSMENT AND PLAN:   84 year old female with past medical history of dementia, anxiety, hypothyroidism who presents to the hospital after a mechanical fall and noted to have bilateral wrist fractures.  1.  Status post fall and bilateral wrist fractures- seen by orthopedics and status post ORIF of both wrists. -Continue pain control with as needed Tylenol and oxycodone.  Await physical therapy evaluation. -Appreciate orthopedics input.  2.  Hypothyroidism-continue Synthroid.  3.  Dementia-continue Aricept.  4.  Tobacco abuse-continue nicotine patch.  5.  Osteoporosis-continue calcium vitamin D supplements.  6.  Depression-continue Celexa.  7.  GERD-continue Protonix.  Await physical therapy evaluation and possible discharge to short-term rehab tomorrow.  All the records are reviewed and case discussed with Care Management/Social Worker. Management plans discussed with the patient, family and they are in agreement.  CODE STATUS: Full code  DVT Prophylaxis: Lovenox  TOTAL TIME TAKING CARE OF THIS PATIENT: 25 minutes.   POSSIBLE D/C IN 1-2 DAYS, DEPENDING ON CLINICAL CONDITION.   Henreitta Leber M.D on 03/25/2018 at 1:54 PM  Between 7am to 6pm - Pager - [phone]  After 6pm go to [email or web address] - Proofreader  [facility]  Office  [phone]  CC: Primary care physician; Derinda Late, MD

## 2018-03-25 NOTE — Plan of Care (Signed)

## 2018-03-25 NOTE — Progress Notes (Signed)
Physical Therapy Treatment Patient Details Name: Tina [name] MRN: [id number] DOB: [birth year] Today's Date: 03/25/2018    History of Present Illness 84 yo female with fall in her home off the porch was wearing new shoes that her daughter disapproved of to PT.  Suffered B wrist fractures with R radial surgical repair and ulnar fracture nondisplaced; L radial surgically repaired impacted fracture.  PMHx:  anxiety, kyphoplasty, shingles, dementia, macular degeneration, PVD   PT Comments    Ms. Leise made good progress with sit<>stand and ambulation but does require verbal cues and reminders for functional implications of NWB status. Pt completed 5xSTS in 22.67 seconds indicating pt at increased risk of falling. She requires intermittent min assist to remain steady when ambulating.  Given pt's current mobility status, SNF remains most appropriate recommendation.    Follow Up Recommendations  SNF     Equipment Recommendations  Other (comment)(TBD at next venue of care)    Recommendations for Other Services OT consult     Precautions / Restrictions Precautions Precautions: Fall Restrictions Weight Bearing Restrictions: Yes RUE Weight Bearing: Non weight bearing LUE Weight Bearing: Non weight bearing Other Position/Activity Restrictions: Platform attachments on walker although did not need to use this session    Mobility  Bed Mobility Overal bed mobility: Needs Assistance Bed Mobility: Supine to Sit     Supine to sit: HOB elevated;Min assist     General bed mobility comments: Pt requires 1 verbal cue to remember not to push through UE, otherwise pt pushing through Bil elbows but ultimately does require min assist to elevate trunk due to poor core strength  Transfers Overall transfer level: Needs assistance Equipment used: None Transfers: Sit to/from Stand Sit to Stand: Min guard         General transfer comment: Cue to not push through UE, pt mildly unsteady but no LOB.   Poor eccentric control to sit.   Ambulation/Gait Ambulation/Gait assistance: Min assist Gait Distance (Feet): 60 Feet Assistive device: None Gait Pattern/deviations: Step-through pattern;Decreased stride length;Narrow base of support Gait velocity: decreased   General Gait Details: Pt with narrow BOS leading to instability and intermittently requiring min assist to steady.  Pt needs reminder x1 not to reach out for bed rail for support to adhere to NWB.     Stairs             Wheelchair Mobility    Modified Rankin (Stroke Patients Only)       Balance Overall balance assessment: Needs assistance Sitting-balance support: Feet supported Sitting balance-Leahy Scale: Fair     Standing balance support: No upper extremity supported;During functional activity Standing balance-Leahy Scale: Poor Standing balance comment: Pt relies on intermittent min assist to remain steady when ambulating                            Cognition Arousal/Alertness: Awake/alert Behavior During Therapy: WFL for tasks assessed/performed Overall Cognitive Status: History of cognitive impairments - at baseline                                        Exercises Other Exercises Other Exercises: Opening and closing fingers Bil in elevated position to decrease swelling, encouraged pt to do this every 30 minutes for ~1 minute Other Exercises: Sit<>stand from chair x8 without UE support    General Comments General comments (skin integrity,  edema, etc.): 5xSTS: 22.67 seconds indicating pt at increased risk of falling.       Pertinent Vitals/Pain Pain Assessment: Faces Faces Pain Scale: Hurts little more Pain Location: Bil wrists/hands Pain Descriptors / Indicators: Grimacing;Guarding Pain Intervention(s): Limited activity within patient's tolerance;Monitored during session;Repositioned;Premedicated before session    Home Living                      Prior Function             PT Goals (current goals can now be found in the care plan section) Acute Rehab PT Goals Patient Stated Goal: decreased pain PT Goal Formulation: With patient/family Time For Goal Achievement: 04/07/18 Potential to Achieve Goals: Good Progress towards PT goals: Progressing toward goals    Frequency    Min 2X/week      PT Plan Current plan remains appropriate    Co-evaluation              AM-PAC PT "6 Clicks" Daily Activity  Outcome Measure  Difficulty turning over in bed (including adjusting bedclothes, sheets and blankets)?: Unable Difficulty moving from lying on back to sitting on the side of the bed? : Unable Difficulty sitting down on and standing up from a chair with arms (e.g., wheelchair, bedside commode, etc,.)?: Unable Help needed moving to and from a bed to chair (including a wheelchair)?: A Little Help needed walking in hospital room?: A Little Help needed climbing 3-5 steps with a railing? : A Lot 6 Click Score: 11    End of Session Equipment Utilized During Treatment: Gait belt Activity Tolerance: Patient tolerated treatment well Patient left: with call bell/phone within reach;with family/visitor present;in chair;with chair alarm set;Other (comment)(BUEs elevated on multiple pillows) Nurse Communication: Mobility status;Other (comment)(pt would like a bath) PT Visit Diagnosis: Unsteadiness on feet (R26.81);Muscle weakness (generalized) (M62.81);History of falling (Z91.81)     Time: 9450-3888 PT Time Calculation (min) (ACUTE ONLY): 16 min  Charges:  $Gait Training: 8-22 mins                     Collie Siad PT, DPT 03/25/2018, 10:52 AM

## 2018-03-25 NOTE — Progress Notes (Signed)
Clinical Social Worker (CSW) met with patient and her son Tina [name] was at bedside. Tina [name] contacted his sister Tina [name] and put her on speaker phone while CSW was in the room. CSW presented bed offers. Patient and her adult children chose [facility]. Plan is for patient to D/C to [facility] tomorrow pending medical clearance. [facility] liaison is aware of above.   [employer], LCSW [phone]

## 2018-03-25 NOTE — Discharge Summary (Signed)
[place] [facility] [place] NAME: Tina [name]    MR#:  [id number]  DATE OF BIRTH:  1928/03/01  DATE OF ADMISSION:  03/23/2018 ADMITTING PHYSICIAN: Loletha Grayer, MD  DATE OF DISCHARGE: 03/25/2018  PRIMARY CARE PHYSICIAN: Derinda Late, MD    ADMISSION DIAGNOSIS:  Closed fracture of both wrists, initial encounter [S62.101A, S62.102A]  DISCHARGE DIAGNOSIS:  Active Problems:   Wrist fracture, bilateral   SECONDARY DIAGNOSIS:   Past Medical History:  Diagnosis Date  . Anxiety   . Cancer ([place])    breast  . Dementia   . Hypothyroidism   . Shingles     HOSPITAL COURSE:   84 year old female with past medical history of dementia, anxiety, hypothyroidism who presents to the hospital after a mechanical fall and noted to have bilateral wrist fractures.  1.  Status post fall and bilateral wrist fractures- seen by orthopedics and status post ORIF of both wrists. -Continue pain control with as needed Tylenol and oxycodone.  Seen by PT and they recommend SNF/STR.  -Appreciate orthopedics input and will follow up with then in the next 1-2 weeks.   2.  Hypothyroidism- she will continue Synthroid.  3.  Dementia- she will continue Aricept.  4.  Osteoporosis- she will continue calcium vitamin D supplements.  6.  Depression- she will continue Celexa.  5.  GERD- s he will continue Protonix.  DISCHARGE CONDITIONS:   Stable  CONSULTS OBTAINED:  Treatment Team:  Dereck Leep, MD Hessie Knows, MD  DRUG ALLERGIES:   Allergies  Allergen Reactions  . Nsaids Other (See Comments)    H/O GI BLEED  . Meloxicam     Other reaction(s): Other (See Comments) Upper GI Bleeding    DISCHARGE MEDICATIONS:   Allergies as of 03/25/2018      Reactions   Nsaids Other (See Comments)   H/O GI BLEED   Meloxicam    Other reaction(s): Other (See Comments) Upper GI Bleeding      Medication List    STOP taking these medications   feeding  supplement (ENSURE ENLIVE) Liqd     TAKE these medications   acetaminophen 325 MG tablet Commonly known as:  TYLENOL Take 2 tablets (650 mg total) by mouth every 6 (six) hours as needed for mild pain (or Fever >/= 101).   alendronate 70 MG tablet Commonly known as:  FOSAMAX Take 1 tablet once a week by mouth.   aspirin EC 81 MG tablet Take 1 tablet (81 mg total) by mouth daily.   Calcium 500-100 MG-UNIT Chew Chew 1 tablet 2 (two) times daily by mouth.   citalopram 10 MG tablet Commonly known as:  CELEXA Take 10 mg by mouth daily.   dicyclomine 20 MG tablet Commonly known as:  BENTYL Take 20 mg by mouth every 6 (six) hours as needed.   docusate sodium 100 MG capsule Commonly known as:  COLACE Take 1 capsule (100 mg total) by mouth 2 (two) times daily as needed for mild constipation.   donepezil 10 MG tablet Commonly known as:  ARICEPT Take 10 mg by mouth every morning.   fluticasone 50 MCG/ACT nasal spray Commonly known as:  FLONASE Place 2 sprays into both nostrils daily.   gabapentin 300 MG capsule Commonly known as:  NEURONTIN Take 600 mg 3 (three) times daily by mouth.   ICAPS AREDS 2 Caps Take 1 capsule by mouth 2 (two) times daily.   levothyroxine 112 MCG tablet Commonly known as:  SYNTHROID, LEVOTHROID Take 112 mcg by mouth daily before breakfast.   multivitamin tablet Take 1 tablet by mouth daily.   oxyCODONE 5 MG immediate release tablet Commonly known as:  Oxy IR/ROXICODONE Take 1 tablet (5 mg total) by mouth every 4 (four) hours as needed for moderate pain or severe pain.   pantoprazole 40 MG tablet Commonly known as:  PROTONIX Take 1 tablet (40 mg total) by mouth 2 (two) times daily.   ursodiol 250 MG tablet Commonly known as:  ACTIGALL TAKE 1 TABLET(250 MG) BY MOUTH THREE TIMES DAILY WITH MEALS         DISCHARGE INSTRUCTIONS:   DIET:  Regular diet  DISCHARGE CONDITION:  Stable  ACTIVITY:  Activity as tolerated   OXYGEN:   Home Oxygen: No.   Oxygen Delivery: room air  DISCHARGE LOCATION:  nursing home   If you experience worsening of your admission symptoms, develop shortness of breath, life threatening emergency, suicidal or homicidal thoughts you must seek medical attention immediately by calling 911 or calling your MD immediately  if symptoms less severe.  You Must read complete instructions/literature along with all the possible adverse reactions/side effects for all the Medicines you take and that have been prescribed to you. Take any new Medicines after you have completely understood and accpet all the possible adverse reactions/side effects.   Please note  You were cared for by a hospitalist during your hospital stay. If you have any questions about your discharge medications or the care you received while you were in the hospital after you are discharged, you can call the unit and asked to speak with the hospitalist on call if the hospitalist that took care of you is not available. Once you are discharged, your primary care physician will handle any further medical issues. Please note that NO REFILLS for any discharge medications will be authorized once you are discharged, as it is imperative that you return to your primary care physician (or establish a relationship with a primary care physician if you do not have one) for your aftercare needs so that they can reassess your need for medications and monitor your lab values.    DATA REVIEW:   CBC Recent Labs  Lab 03/24/18 0605  WBC 10.4  HGB 11.4*  HCT 32.9*  PLT 163    Chemistries  Recent Labs  Lab 03/24/18 0605  NA 139  K 3.9  CL 107  CO2 26  GLUCOSE 90  BUN 6*  CREATININE 0.76  CALCIUM 8.0*    Cardiac Enzymes Recent Labs  Lab 03/23/18 1855  TROPONINI <0.03    Microbiology Results  Results for orders placed or performed during the hospital encounter of 03/23/18  Surgical pcr screen     Status: None   Collection Time:  03/23/18  9:41 PM  Result Value Ref Range Status   MRSA, PCR NEGATIVE NEGATIVE Final   Staphylococcus aureus NEGATIVE NEGATIVE Final    Comment: (NOTE) The Xpert SA Assay (FDA approved for NASAL specimens in patients 38 years of age and older), is one component of a comprehensive surveillance program. It is not intended to diagnose infection nor to guide or monitor treatment. Performed at [facility], [address] [place], [place] [address]     RADIOLOGY:  Dg Wrist 2 Views Left  Result Date: 03/24/2018 CLINICAL DATA:  Post ORIF of left wrist fracture EXAM: LEFT WRIST - 2 VIEW COMPARISON:  Left wrist films of 03/23/2017 FINDINGS: The impacted fracture of the  distal left radius has been stabilized with placement of fixation plate and screws. Anatomic alignment is present. No complicating features are seen. IMPRESSION: ORIF of transverse impacted distal left radial fracture. Electronically Signed   By: Ivar Drape M.D.   On: 03/24/2018 14:07   Dg Wrist 2 Views Right  Result Date: 03/24/2018 CLINICAL DATA:  ORIF. EXAM: RIGHT WRIST - 2 VIEW COMPARISON:  03/23/2018 FINDINGS: Examination demonstrates a volar fixation plate with screws bridging patient's distal radial fracture as hardware is intact with anatomic alignment over the fracture site. Continued evidence of patient's distal displaced ulnar fracture. Remainder of the exam is unchanged. IMPRESSION: Fixation patient's distal radial fracture with hardware intact and anatomic alignment over the fracture site. No change in patient's nondisplaced distal ulnar fracture. Electronically Signed   By: Marin Olp M.D.   On: 03/24/2018 13:53   Dg Wrist Complete Left  Result Date: 03/23/2018 CLINICAL DATA:  Unwitnessed fall.  Bilateral forearm pain, swelling EXAM: LEFT WRIST - COMPLETE 3+ VIEW COMPARISON:  None. FINDINGS: There is a mildly impacted fracture in the distal left radius. No visible ulnar abnormality. No subluxation or  dislocation. IMPRESSION: Mildly impacted distal left radial fracture. Electronically Signed   By: Rolm Baptise M.D.   On: 03/23/2018 18:40   Dg Wrist Complete Right  Result Date: 03/23/2018 CLINICAL DATA:  Status post splint placement. EXAM: RIGHT WRIST - COMPLETE 3+ VIEW COMPARISON:  RIGHT wrist radiograph March 23, 2018 at 1821 hours FINDINGS: Acute distal radial fracture with impaction, dorsal angulation distal bony fracture improved from prior imaging. Acute displaced ulnar styloid fracture. No dislocation. Osteopenia. Soft tissue swelling. Thick fiberglass cast obscures the fine bony detail. IMPRESSION: Fiberglass splint transfixing acute displaced distal radial fracture in improved alignment and displaced ulnar styloid fracture. Electronically Signed   By: Elon Alas M.D.   On: 03/23/2018 19:59   Dg Wrist Complete Right  Result Date: 03/23/2018 CLINICAL DATA:  Fall, right wrist deformity and pain EXAM: RIGHT WRIST - COMPLETE 3+ VIEW COMPARISON:  03/23/2018 FINDINGS: Acute dorsally displaced fractures of the right distal radius and ulna. Soft tissue swelling noted. Bones are osteopenic. Carpal bones appear intact. IMPRESSION: Acute dorsally displaced distal radius and ulna fractures. Osteopenia Electronically Signed   By: Jerilynn Mages.  Shick M.D.   On: 03/23/2018 18:41      Management plans discussed with the patient, family and they are in agreement.  CODE STATUS:     Code Status Orders  (From admission, onward)         Start     Ordered   03/23/18 2050  Full code  Continuous     03/23/18 2050        TOTAL TIME TAKING CARE OF THIS PATIENT: 40 minutes.    Henreitta Leber M.D on 03/25/2018 at 1:57 PM  Between 7am to 6pm - Pager - [phone]  After 6pm go to [email or web address] - Proofreader  [facility]  Office  [phone]  CC: Primary care physician; Derinda Late, MD

## 2018-03-25 NOTE — Progress Notes (Signed)
   Subjective: 1 Day Post-Op Procedure(s) (LRB): OPEN REDUCTION INTERNAL FIXATION (ORIF) WRIST FRACTURE (Bilateral) Patient reports pain as moderate and severe in wrists. Has not taken tylenol. Is due oxycodone at 8:55 Patient is well, and has had no acute complaints or problems Denies any CP, SOB, ABD pain. We will continue therapy today.    Objective: Vital signs in last 24 hours: Temp:  [97.8 F (36.6 C)-99 F (37.2 C)] 97.8 F (36.6 C) (09/27 0357) Pulse Rate:  [82-93] 82 (09/27 0357) Resp:  [14-22] 18 (09/27 0357) BP: (101-155)/(54-85) 110/54 (09/27 0357) SpO2:  [92 %-100 %] 95 % (09/27 0357) FiO2 (%):  [28 %] 28 % (09/26 1526)  Intake/Output from previous day: 09/26 0701 - 09/27 0700 In: 1226.7 [P.O.:240; I.V.:986.7] Out: -  Intake/Output this shift: No intake/output data recorded.  Recent Labs    03/23/18 1855 03/24/18 0605  HGB 12.2 11.4*   Recent Labs    03/23/18 1855 03/24/18 0605  WBC 14.5* 10.4  RBC 4.15 3.77*  HCT 36.3 32.9*  PLT 182 163   Recent Labs    03/23/18 1855 03/24/18 0605  NA 138 139  K 4.6 3.9  CL 105 107  CO2 24 26  BUN 6* 6*  CREATININE 0.75 0.76  GLUCOSE 119* 90  CALCIUM 8.4* 8.0*   No results for input(s): LABPT, INR in the last 72 hours.  EXAM General - Patient is Alert, Appropriate and Oriented Left and right upper extremities - Neurovascular intact Sensation intact distally Intact pulses distally Dorsiflexion/Plantar flexion intact No cellulitis present Compartment soft  Minimal swelling throughout the digits.  She is close to making full fist.  Mild ecchymosis noted. Dressing - dressing C/D/I and no drainage   Past Medical History:  Diagnosis Date  . Anxiety   . Cancer ([place])    breast  . Dementia   . Hypothyroidism   . Shingles     Assessment/Plan:   1 Day Post-Op Procedure(s) (LRB): OPEN REDUCTION INTERNAL FIXATION (ORIF) WRIST FRACTURE (Bilateral) Active Problems:   Wrist fracture,  bilateral  Estimated body mass index is 29.29 kg/m as calculated from the following:   Height as of this encounter: 5' (1.524 m).   Weight as of this encounter: 68 kg. Advance diet Up with therapy, platform walker nonweightbearing upper extremities Continue with Ace wrap and volar splints. Continue with oxycodone 5 mg every 4 hours.  Encouraged patient to use Tylenol between oxycodone dosages.  We will continue to monitor pain.  Patient will need to follow-up with [facility] Monday or Tuesday of next week for bilateral splint removal, incision site check and application of Velcro wrist braces.    Ronney Asters, PA-C [place] 03/25/2018, 8:11 AM

## 2018-03-26 MED ORDER — FLEET ENEMA 7-19 GM/118ML RE ENEM: 1.0000 | ENEMA | Freq: Every day | RECTAL | Status: DC | PRN

## 2018-03-26 MED ORDER — BISACODYL 10 MG RE SUPP
10.0000 mg | RECTAL | Status: AC
  Administered 2018-03-26: 10 mg via RECTAL
  Filled 2018-03-26: qty 1

## 2018-03-26 MED ORDER — OXYCODONE HCL 5 MG PO TABS: 5.0000 mg | ORAL_TABLET | Freq: Four times a day (QID) | ORAL | 0 refills | Status: DC | PRN

## 2018-03-26 NOTE — Clinical Social Work Note (Signed)
The patient will discharge today to Room 608 at [facility] via non-Emergent EMS. The patient, her daughter, her son, and the facility are aware and in agreement. The CSW has delivered the discharge packet and sent all needed documentation to the facility. The CSW will sign off. Please consult should needs arise.  Santiago Bumpers, MSW, Latanya Presser [phone]

## 2018-03-26 NOTE — Progress Notes (Signed)
Report called to [facility], EMS called for transport. Patient and family updated on plan of care. IV removed.

## 2018-03-26 NOTE — Progress Notes (Signed)
Patient ID: Tina [name], female   DOB: [birth year], 84 y.o.   MRN: [id number]  [facility] PROGRESS NOTE  Tina [name] PPJ:[id number] DOB: 05/19/1928 DOA: 03/23/2018 PCP: Derinda Late, MD  HPI/Subjective: Patient feeling okay.  Pain in the wrist.  Swelling in the wrist.  Limited movement with her wrist.  Objective: Vitals:   03/25/18 1728 03/26/18 0020  BP: (!) 124/56 (!) 102/53  Pulse: 85 88  Resp: 18 17  Temp: 98 F (36.7 C) 98.2 F (36.8 C)  SpO2: 93% 90%    Intake/Output Summary (Last 24 hours) at 03/26/2018 0804 Last data filed at 03/25/2018 1547 Gross per 24 hour  Intake 551.33 ml  Output -  Net 551.33 ml   Filed Weights   03/23/18 1820 03/23/18 2145  Weight: 70.3 kg 68 kg    ROS: Review of Systems  Constitutional: Negative for chills and fever.  Eyes: Negative for blurred vision.  Respiratory: Negative for cough and shortness of breath.   Cardiovascular: Negative for chest pain.  Gastrointestinal: Positive for constipation. Negative for abdominal pain, diarrhea, nausea and vomiting.  Genitourinary: Negative for dysuria.  Musculoskeletal: Positive for joint pain.  Neurological: Negative for dizziness and headaches.   Exam: Physical Exam  Constitutional: She is oriented to person, place, and time.  HENT:  Nose: No mucosal edema.  Mouth/Throat: No oropharyngeal exudate or posterior oropharyngeal edema.  Eyes: Pupils are equal, round, and reactive to light. Conjunctivae, EOM and lids are normal.  Neck: No JVD present. Carotid bruit is not present. No edema present. No thyroid mass and no thyromegaly present.  Cardiovascular: S1 normal and S2 normal. Exam reveals no gallop.  No murmur heard. Pulses:      Dorsalis pedis pulses are 2+ on the right side, and 2+ on the left side.  Respiratory: No respiratory distress. She has no wheezes. She has no rhonchi. She has no rales.  GI: Soft. Bowel sounds are normal. There is no tenderness.  Musculoskeletal:     Right ankle: She exhibits no swelling.       Left ankle: She exhibits no swelling.  Lymphadenopathy:    She has no cervical adenopathy.  Neurological: She is alert and oriented to person, place, and time. No cranial nerve deficit.  Skin: Skin is warm. Nails show no clubbing.  Bruising bilateral hands and right elbow  Psychiatric: She has a normal mood and affect.      Data Reviewed: Basic Metabolic Panel: Recent Labs  Lab 03/23/18 1855 03/24/18 0605  NA 138 139  K 4.6 3.9  CL 105 107  CO2 24 26  GLUCOSE 119* 90  BUN 6* 6*  CREATININE 0.75 0.76  CALCIUM 8.4* 8.0*   CBC: Recent Labs  Lab 03/23/18 1855 03/24/18 0605  WBC 14.5* 10.4  NEUTROABS 12.4*  --   HGB 12.2 11.4*  HCT 36.3 32.9*  MCV 87.5 87.1  PLT 182 163   Cardiac Enzymes: Recent Labs  Lab 03/23/18 1855  CKTOTAL 60  TROPONINI <0.03    Recent Results (from the past 240 hour(s))  Surgical pcr screen     Status: None   Collection Time: 03/23/18  9:41 PM  Result Value Ref Range Status   MRSA, PCR NEGATIVE NEGATIVE Final   Staphylococcus aureus NEGATIVE NEGATIVE Final    Comment: (NOTE) The Xpert SA Assay (FDA approved for NASAL specimens in patients 19 years of age and older), is one component of a comprehensive surveillance program. It is not intended to diagnose  infection nor to guide or monitor treatment. Performed at [facility], [address] [place], [place] [address]      Studies: Dg Wrist 2 Views Left  Result Date: 03/24/2018 CLINICAL DATA:  Post ORIF of left wrist fracture EXAM: LEFT WRIST - 2 VIEW COMPARISON:  Left wrist films of 03/23/2017 FINDINGS: The impacted fracture of the distal left radius has been stabilized with placement of fixation plate and screws. Anatomic alignment is present. No complicating features are seen. IMPRESSION: ORIF of transverse impacted distal left radial fracture. Electronically Signed   By: Ivar Drape M.D.   On: 03/24/2018 14:07   Dg Wrist 2  Views Right  Result Date: 03/24/2018 CLINICAL DATA:  ORIF. EXAM: RIGHT WRIST - 2 VIEW COMPARISON:  03/23/2018 FINDINGS: Examination demonstrates a volar fixation plate with screws bridging patient's distal radial fracture as hardware is intact with anatomic alignment over the fracture site. Continued evidence of patient's distal displaced ulnar fracture. Remainder of the exam is unchanged. IMPRESSION: Fixation patient's distal radial fracture with hardware intact and anatomic alignment over the fracture site. No change in patient's nondisplaced distal ulnar fracture. Electronically Signed   By: Marin Olp M.D.   On: 03/24/2018 13:53    Scheduled Meds: . bisacodyl  10 mg Rectal STAT  . calcium-vitamin D  1 tablet Oral BID  . citalopram  10 mg Oral Daily  . docusate sodium  100 mg Oral BID  . donepezil  10 mg Oral q morning - 10a  . enoxaparin (LOVENOX) injection  40 mg Subcutaneous Q24H  . fluticasone  2 spray Each Nare Daily  . gabapentin  600 mg Oral TID  . levothyroxine  100 mcg Oral QAC breakfast  . multivitamin with minerals  1 tablet Oral Daily  . nicotine  7 mg Transdermal Daily  . pantoprazole  40 mg Oral BID  . ursodiol  300 mg Oral BID   Continuous Infusions:  Assessment/Plan:  1. Bilateral wrist fractures.  Status post ORIF.  Patient to go out to rehab today.  Follow-up with orthopedic surgery next week.  Pain control with oxycodone as needed and Tylenol. 2. Hypothyroidism unspecified on Synthroid 3. Mild dementia on Aricept 4. History of postherpetic neuralgia and gabapentin 5. History of peptic ulcer disease on PPI 6. Anxiety on Celexa 7. Tobacco abuse on nicotine patch 7 mg daily 8. Constipation postoperatively we will give a Dulcolax suppository and/or enema prior to disposition.  Patient had diarrhea prior to coming into the hospital and she took Imodium  Code Status:     Code Status Orders  (From admission, onward)         Start     Ordered   03/23/18 2050   Full code  Continuous     03/23/18 2050        Code Status History    Date Active Date Inactive Code Status Order ID Comments User Context   05/15/2017 1451 05/18/2017 1446 Full Code [id number]  Idelle Crouch, MD Inpatient   06/27/2015 1557 06/27/2015 1932 Full Code [id number]  Hessie Knows, MD Inpatient    Advance Directive Documentation     Most Recent Value  Type of Advance Directive  Healthcare Power of Attorney  Pre-existing out of facility DNR order (yellow form or pink MOST form)  -  "MOST" Form in Place?  -     Family Communication: Spoke with daughter at the bedside Disposition Plan: Discharge to rehab today  Consultants:  Orthopedic surgery  Procedures:  Bilateral ORIF on the wrists  Time spent: 31 minutes  [place]

## 2018-03-26 NOTE — Progress Notes (Signed)
   Subjective: 2 Days Post-Op Procedure(s) (LRB): OPEN REDUCTION INTERNAL FIXATION (ORIF) WRIST FRACTURE (Bilateral) Patient reports pain as mild and moderate in wrists, right greater than left. Tolerating pain medicine well. Patient is well, and has had no acute complaints or problems Denies any CP, SOB, ABD pain. We will continue therapy today.    Objective: Vital signs in last 24 hours: Temp:  [98 F (36.7 C)-98.6 F (37 C)] 98.2 F (36.8 C) (09/28 0020) Pulse Rate:  [85-88] 88 (09/28 0020) Resp:  [17-18] 17 (09/28 0020) BP: (102-142)/(53-59) 102/53 (09/28 0020) SpO2:  [90 %-95 %] 90 % (09/28 0020)  Intake/Output from previous day: 09/27 0701 - 09/28 0700 In: 551.3 [P.O.:120; I.V.:431.3] Out: -  Intake/Output this shift: No intake/output data recorded.  Recent Labs    03/23/18 1855 03/24/18 0605  HGB 12.2 11.4*   Recent Labs    03/23/18 1855 03/24/18 0605  WBC 14.5* 10.4  RBC 4.15 3.77*  HCT 36.3 32.9*  PLT 182 163   Recent Labs    03/23/18 1855 03/24/18 0605  NA 138 139  K 4.6 3.9  CL 105 107  CO2 24 26  BUN 6* 6*  CREATININE 0.75 0.76  GLUCOSE 119* 90  CALCIUM 8.4* 8.0*   No results for input(s): LABPT, INR in the last 72 hours.  EXAM General - Patient is Alert, Appropriate and Oriented Left and right upper extremities - Neurovascular intact Sensation intact distally Intact pulses distally Dorsiflexion/Plantar flexion intact No cellulitis present Compartment soft  Minimal swelling throughout the digits.  She is close to making full fist.  Mild ecchymosis noted. Dressing - dressing C/D/I and no drainage   Past Medical History:  Diagnosis Date  . Anxiety   . Cancer ([place])    breast  . Dementia   . Hypothyroidism   . Shingles     Assessment/Plan:   2 Days Post-Op Procedure(s) (LRB): OPEN REDUCTION INTERNAL FIXATION (ORIF) WRIST FRACTURE (Bilateral) Active Problems:   Wrist fracture, bilateral  Estimated body mass index is 29.29  kg/m as calculated from the following:   Height as of this encounter: 5' (1.524 m).   Weight as of this encounter: 68 kg. Advance diet Up with therapy, platform walker nonweightbearing upper extremities Continue with Ace wrap and volar splints. Continue with oxycodone 5 mg every 4 hours.  Encouraged patient to use Tylenol between oxycodone dosages.  We will continue to monitor pain.  Patient will need to follow-up with [facility] Monday or Tuesday of next week for bilateral splint removal, incision site check and application of Velcro wrist braces.    Ronney Asters, PA-C [place] 03/26/2018, 7:29 AM

## 2018-03-26 NOTE — Progress Notes (Signed)
Physical Therapy Treatment Patient Details Name: Tina [name] MRN: [id number] DOB: Aug 27, 1927 Today's Date: 03/26/2018    History of Present Illness 84 yo female with fall in her home off the porch was wearing new shoes that her daughter disapproved of to PT.  Suffered B wrist fractures with R radial surgical repair and ulnar fracture nondisplaced; L radial surgically repaired impacted fracture.  PMHx:  anxiety, kyphoplasty, shingles, dementia, macular degeneration, PVD    PT Comments    Pt awaiting transfer to SNF.  Agrees to session.  Min guard/assist for bed mobility.  She was able to stand with min guard, assist to position arms on platform walker.  She was able to ambulate 100' with min guard/assist.  She has difficulty maneuvering walker around obstacles and requires assist.  Overall does well but is fatigued with effort.  Returned to bed with alarm set.     Follow Up Recommendations  SNF     Equipment Recommendations       Recommendations for Other Services       Precautions / Restrictions Precautions Precautions: Fall Precaution Comments: NWB B wrists Restrictions Weight Bearing Restrictions: Yes RUE Weight Bearing: Non weight bearing LUE Weight Bearing: Non weight bearing    Mobility  Bed Mobility Overal bed mobility: Needs Assistance Bed Mobility: Supine to Sit;Sit to Supine     Supine to sit: HOB elevated;Min assist Sit to supine: Min assist   General bed mobility comments: Does well pushing through hips and elbows to transition today  Transfers Overall transfer level: Needs assistance Equipment used: None Transfers: Sit to/from Stand Sit to Stand: Min guard         General transfer comment: Pt mildly unsteady but no LOB  Ambulation/Gait Ambulation/Gait assistance: Min guard Gait Distance (Feet): 100 Feet Assistive device: Rolling walker (2 wheeled);Bilateral platform walker Gait Pattern/deviations: Step-through pattern Gait velocity: decreased       Stairs             Wheelchair Mobility    Modified Rankin (Stroke Patients Only)       Balance Overall balance assessment: Needs assistance Sitting-balance support: Feet supported Sitting balance-Leahy Scale: Fair     Standing balance support: Bilateral upper extremity supported;During functional activity Standing balance-Leahy Scale: Fair Standing balance comment: Did well with platform walker today                            Cognition Arousal/Alertness: Awake/alert Behavior During Therapy: WFL for tasks assessed/performed Overall Cognitive Status: History of cognitive impairments - at baseline                                        Exercises      General Comments        Pertinent Vitals/Pain Pain Assessment: Faces Faces Pain Scale: Hurts little more Pain Location: Bil wrists/hands Pain Descriptors / Indicators: Grimacing;Guarding Pain Intervention(s): Limited activity within patient's tolerance;Monitored during session    Home Living                      Prior Function            PT Goals (current goals can now be found in the care plan section) Progress towards PT goals: Progressing toward goals    Frequency    BID      PT  Plan Current plan remains appropriate    Co-evaluation              AM-PAC PT "6 Clicks" Daily Activity  Outcome Measure  Difficulty turning over in bed (including adjusting bedclothes, sheets and blankets)?: A Little Difficulty moving from lying on back to sitting on the side of the bed? : A Little Difficulty sitting down on and standing up from a chair with arms (e.g., wheelchair, bedside commode, etc,.)?: A Little Help needed moving to and from a bed to chair (including a wheelchair)?: A Little Help needed walking in hospital room?: A Little Help needed climbing 3-5 steps with a railing? : A Lot 6 Click Score: 17    End of Session Equipment Utilized During  Treatment: Gait belt Activity Tolerance: Patient tolerated treatment well Patient left: in bed;with bed alarm set;with call bell/phone within reach;with family/visitor present         Time: 3007-6226 PT Time Calculation (min) (ACUTE ONLY): 10 min  Charges:  $Gait Training: 8-22 mins                     Chesley Noon, PTA 03/26/18, 11:08 AM

## 2018-05-05 ENCOUNTER — Other Ambulatory Visit: Payer: Self-pay | Admitting: Orthopedic Surgery

## 2018-05-05 DIAGNOSIS — M546 Pain in thoracic spine: Secondary | ICD-10-CM

## 2018-05-07 ENCOUNTER — Ambulatory Visit
Admission: RE | Admit: 2018-05-07 | Discharge: 2018-05-07 | Disposition: A | Discharge status: Home / Self Care | Payer: Medicare Other | Source: Ambulatory Visit | Attending: Orthopedic Surgery | Admitting: Orthopedic Surgery

## 2018-05-07 DIAGNOSIS — M545 Low back pain: Secondary | ICD-10-CM | POA: Diagnosis not present

## 2018-05-07 DIAGNOSIS — M546 Pain in thoracic spine: Secondary | ICD-10-CM

## 2018-05-07 DIAGNOSIS — Z9889 Other specified postprocedural states: Secondary | ICD-10-CM | POA: Diagnosis not present

## 2018-05-07 DIAGNOSIS — M4854XA Collapsed vertebra, not elsewhere classified, thoracic region, initial encounter for fracture: Secondary | ICD-10-CM | POA: Insufficient documentation

## 2018-05-09 NOTE — Pre-Procedure Instructions (Signed)
SPOKE WITH DAUGHTER DIANNA AND GIVEN PREOP INSTRUCTIONS. TO TAKE CITALOPRAM ,GABAPENTIN,LEVOTHYROXIN,PANTOPRAZOLE

## 2018-05-10 ENCOUNTER — Ambulatory Visit
Admission: RE | Admit: 2018-05-10 | Discharge: 2018-05-11 | Disposition: A | Discharge status: Home / Self Care | Payer: Medicare Other | Source: Ambulatory Visit | Attending: Orthopedic Surgery | Admitting: Orthopedic Surgery

## 2018-05-10 ENCOUNTER — Encounter: Payer: Self-pay | Admitting: *Deleted

## 2018-05-10 ENCOUNTER — Ambulatory Visit: Payer: Medicare Other | Admitting: Certified Registered"

## 2018-05-10 ENCOUNTER — Encounter
Admission: RE | Disposition: A | Discharge status: Home / Self Care | Payer: Self-pay | Source: Ambulatory Visit | Attending: Orthopedic Surgery

## 2018-05-10 ENCOUNTER — Ambulatory Visit: Payer: Medicare Other

## 2018-05-10 ENCOUNTER — Other Ambulatory Visit: Payer: Self-pay

## 2018-05-10 DIAGNOSIS — M4854XA Collapsed vertebra, not elsewhere classified, thoracic region, initial encounter for fracture: Secondary | ICD-10-CM | POA: Diagnosis not present

## 2018-05-10 DIAGNOSIS — Z79899 Other long term (current) drug therapy: Secondary | ICD-10-CM | POA: Diagnosis not present

## 2018-05-10 DIAGNOSIS — K219 Gastro-esophageal reflux disease without esophagitis: Secondary | ICD-10-CM | POA: Diagnosis not present

## 2018-05-10 DIAGNOSIS — F0390 Unspecified dementia without behavioral disturbance: Secondary | ICD-10-CM | POA: Diagnosis not present

## 2018-05-10 DIAGNOSIS — Z9013 Acquired absence of bilateral breasts and nipples: Secondary | ICD-10-CM | POA: Diagnosis not present

## 2018-05-10 DIAGNOSIS — Z7983 Long term (current) use of bisphosphonates: Secondary | ICD-10-CM | POA: Insufficient documentation

## 2018-05-10 DIAGNOSIS — Z419 Encounter for procedure for purposes other than remedying health state, unspecified: Secondary | ICD-10-CM

## 2018-05-10 DIAGNOSIS — Z7982 Long term (current) use of aspirin: Secondary | ICD-10-CM | POA: Diagnosis not present

## 2018-05-10 DIAGNOSIS — E039 Hypothyroidism, unspecified: Secondary | ICD-10-CM | POA: Diagnosis not present

## 2018-05-10 DIAGNOSIS — M810 Age-related osteoporosis without current pathological fracture: Secondary | ICD-10-CM | POA: Diagnosis not present

## 2018-05-10 DIAGNOSIS — M549 Dorsalgia, unspecified: Secondary | ICD-10-CM | POA: Diagnosis present

## 2018-05-10 DIAGNOSIS — Z79890 Hormone replacement therapy (postmenopausal): Secondary | ICD-10-CM | POA: Diagnosis not present

## 2018-05-10 DIAGNOSIS — F329 Major depressive disorder, single episode, unspecified: Secondary | ICD-10-CM | POA: Diagnosis not present

## 2018-05-10 DIAGNOSIS — Z87891 Personal history of nicotine dependence: Secondary | ICD-10-CM | POA: Insufficient documentation

## 2018-05-10 DIAGNOSIS — R2689 Other abnormalities of gait and mobility: Secondary | ICD-10-CM | POA: Diagnosis not present

## 2018-05-10 DIAGNOSIS — Z853 Personal history of malignant neoplasm of breast: Secondary | ICD-10-CM | POA: Diagnosis not present

## 2018-05-10 DIAGNOSIS — Z9889 Other specified postprocedural states: Secondary | ICD-10-CM

## 2018-05-10 DIAGNOSIS — Z7951 Long term (current) use of inhaled steroids: Secondary | ICD-10-CM | POA: Insufficient documentation

## 2018-05-10 HISTORY — PX: KYPHOPLASTY: SHX5884

## 2018-05-10 SURGERY — KYPHOPLASTY
Anesthesia: General

## 2018-05-10 MED ORDER — PROPOFOL 500 MG/50ML IV EMUL
INTRAVENOUS | Status: DC | PRN
  Administered 2018-05-10: 25 ug/kg/min via INTRAVENOUS

## 2018-05-10 MED ORDER — ASPIRIN EC 81 MG PO TBEC
81.0000 mg | DELAYED_RELEASE_TABLET | Freq: Every day | ORAL | Status: DC
  Administered 2018-05-10 – 2018-05-11 (×2): 81 mg via ORAL
  Filled 2018-05-10 (×2): qty 1

## 2018-05-10 MED ORDER — PANTOPRAZOLE SODIUM 40 MG PO TBEC
40.0000 mg | DELAYED_RELEASE_TABLET | Freq: Two times a day (BID) | ORAL | Status: DC
  Administered 2018-05-10 – 2018-05-11 (×2): 40 mg via ORAL
  Filled 2018-05-10 (×2): qty 1

## 2018-05-10 MED ORDER — ACETAMINOPHEN 325 MG PO TABS: 325.0000 mg | ORAL_TABLET | Freq: Four times a day (QID) | ORAL | Status: DC | PRN

## 2018-05-10 MED ORDER — BUPIVACAINE-EPINEPHRINE (PF) 0.5% -1:200000 IJ SOLN
INTRAMUSCULAR | Status: DC | PRN
  Administered 2018-05-10: 20 mL via PERINEURAL

## 2018-05-10 MED ORDER — OCUVITE-LUTEIN PO CAPS
1.0000 | ORAL_CAPSULE | Freq: Two times a day (BID) | ORAL | Status: DC
  Administered 2018-05-10 – 2018-05-11 (×2): 1 via ORAL
  Filled 2018-05-10 (×3): qty 1

## 2018-05-10 MED ORDER — LEVOTHYROXINE SODIUM 112 MCG PO TABS
112.0000 ug | ORAL_TABLET | Freq: Every day | ORAL | Status: DC
  Administered 2018-05-11: 112 ug via ORAL
  Filled 2018-05-10 (×2): qty 1

## 2018-05-10 MED ORDER — ONDANSETRON HCL 4 MG/2ML IJ SOLN: 4.0000 mg | Freq: Four times a day (QID) | INTRAMUSCULAR | Status: DC | PRN

## 2018-05-10 MED ORDER — KETAMINE HCL 50 MG/ML IJ SOLN
INTRAMUSCULAR | Status: AC
  Filled 2018-05-10: qty 10

## 2018-05-10 MED ORDER — PROPOFOL 10 MG/ML IV BOLUS
INTRAVENOUS | Status: AC
  Filled 2018-05-10: qty 20

## 2018-05-10 MED ORDER — CEFAZOLIN SODIUM-DEXTROSE 1-4 GM/50ML-% IV SOLN
INTRAVENOUS | Status: AC
  Administered 2018-05-10: 1000 mg
  Filled 2018-05-10: qty 50

## 2018-05-10 MED ORDER — LIDOCAINE HCL 1 % IJ SOLN
INTRAMUSCULAR | Status: DC | PRN
  Administered 2018-05-10: 30 mL

## 2018-05-10 MED ORDER — ACETAMINOPHEN 325 MG PO TABS: 650.0000 mg | ORAL_TABLET | Freq: Four times a day (QID) | ORAL | Status: DC | PRN

## 2018-05-10 MED ORDER — FENTANYL CITRATE (PF) 100 MCG/2ML IJ SOLN: 25.0000 ug | INTRAMUSCULAR | Status: DC | PRN

## 2018-05-10 MED ORDER — SODIUM CHLORIDE 0.9 % IV SOLN
INTRAVENOUS | Status: DC
  Administered 2018-05-10: 22:00:00 via INTRAVENOUS

## 2018-05-10 MED ORDER — LIDOCAINE HCL (PF) 2 % IJ SOLN
INTRAMUSCULAR | Status: AC
  Filled 2018-05-10: qty 10

## 2018-05-10 MED ORDER — CALCIUM CARBONATE-VITAMIN D 500-200 MG-UNIT PO TABS
2.0000 | ORAL_TABLET | Freq: Two times a day (BID) | ORAL | Status: DC
  Administered 2018-05-10 – 2018-05-11 (×2): 2 via ORAL
  Filled 2018-05-10 (×2): qty 2

## 2018-05-10 MED ORDER — ADULT MULTIVITAMIN W/MINERALS CH
1.0000 | ORAL_TABLET | Freq: Every day | ORAL | Status: DC
  Administered 2018-05-11: 1 via ORAL
  Filled 2018-05-10: qty 1

## 2018-05-10 MED ORDER — METHOCARBAMOL 500 MG PO TABS: 500.0000 mg | ORAL_TABLET | Freq: Four times a day (QID) | ORAL | Status: DC | PRN

## 2018-05-10 MED ORDER — BISACODYL 5 MG PO TBEC: 5.0000 mg | DELAYED_RELEASE_TABLET | Freq: Every day | ORAL | Status: DC | PRN

## 2018-05-10 MED ORDER — METHOCARBAMOL 1000 MG/10ML IJ SOLN
500.0000 mg | Freq: Four times a day (QID) | INTRAVENOUS | Status: DC | PRN
  Filled 2018-05-10: qty 5

## 2018-05-10 MED ORDER — DOCUSATE SODIUM 100 MG PO CAPS
100.0000 mg | ORAL_CAPSULE | Freq: Two times a day (BID) | ORAL | Status: DC
  Administered 2018-05-10 – 2018-05-11 (×2): 100 mg via ORAL
  Filled 2018-05-10 (×2): qty 1

## 2018-05-10 MED ORDER — CEFAZOLIN SODIUM-DEXTROSE 1-4 GM/50ML-% IV SOLN
1.0000 g | INTRAVENOUS | Status: AC
  Administered 2018-05-10: 2 g via INTRAVENOUS

## 2018-05-10 MED ORDER — RISAQUAD PO CAPS
1.0000 | ORAL_CAPSULE | Freq: Two times a day (BID) | ORAL | Status: DC
  Administered 2018-05-10 – 2018-05-11 (×2): 1 via ORAL
  Filled 2018-05-10 (×3): qty 1

## 2018-05-10 MED ORDER — MAGNESIUM CITRATE PO SOLN
1.0000 | Freq: Once | ORAL | Status: DC | PRN
  Filled 2018-05-10: qty 296

## 2018-05-10 MED ORDER — DIPHENHYDRAMINE HCL 12.5 MG/5ML PO ELIX: 12.5000 mg | ORAL_SOLUTION | ORAL | Status: DC | PRN

## 2018-05-10 MED ORDER — ACETAMINOPHEN 500 MG PO TABS
500.0000 mg | ORAL_TABLET | Freq: Four times a day (QID) | ORAL | Status: AC
  Administered 2018-05-10 – 2018-05-11 (×4): 500 mg via ORAL
  Filled 2018-05-10 (×4): qty 1

## 2018-05-10 MED ORDER — MORPHINE SULFATE (PF) 2 MG/ML IV SOLN: 0.5000 mg | INTRAVENOUS | Status: DC | PRN

## 2018-05-10 MED ORDER — DONEPEZIL HCL 5 MG PO TABS
10.0000 mg | ORAL_TABLET | Freq: Every day | ORAL | Status: DC
  Administered 2018-05-10: 10 mg via ORAL
  Filled 2018-05-10 (×2): qty 2

## 2018-05-10 MED ORDER — KETAMINE HCL 50 MG/ML IJ SOLN
INTRAMUSCULAR | Status: DC | PRN
  Administered 2018-05-10: 15 mg via INTRAVENOUS
  Administered 2018-05-10: 10 mg via INTRAMUSCULAR

## 2018-05-10 MED ORDER — CEFAZOLIN SODIUM-DEXTROSE 1-4 GM/50ML-% IV SOLN
1.0000 g | Freq: Four times a day (QID) | INTRAVENOUS | Status: AC
  Administered 2018-05-10 – 2018-05-11 (×3): 1 g via INTRAVENOUS
  Filled 2018-05-10 (×3): qty 50

## 2018-05-10 MED ORDER — FLUTICASONE PROPIONATE 50 MCG/ACT NA SUSP
2.0000 | Freq: Every day | NASAL | Status: DC
  Administered 2018-05-10: 2 via NASAL
  Filled 2018-05-10: qty 16

## 2018-05-10 MED ORDER — METOCLOPRAMIDE HCL 10 MG PO TABS: 5.0000 mg | ORAL_TABLET | Freq: Three times a day (TID) | ORAL | Status: DC | PRN

## 2018-05-10 MED ORDER — DICYCLOMINE HCL 20 MG PO TABS
20.0000 mg | ORAL_TABLET | Freq: Four times a day (QID) | ORAL | Status: DC
  Administered 2018-05-10 – 2018-05-11 (×2): 20 mg via ORAL
  Filled 2018-05-10 (×5): qty 1

## 2018-05-10 MED ORDER — DEXTROSE 5 % IV SOLN: 1000.0000 mg | Freq: Once | INTRAVENOUS | Status: DC

## 2018-05-10 MED ORDER — HYDROCODONE-ACETAMINOPHEN 7.5-325 MG PO TABS: 1.0000 | ORAL_TABLET | ORAL | Status: DC | PRN

## 2018-05-10 MED ORDER — LACTATED RINGERS IV SOLN
INTRAVENOUS | Status: DC
  Administered 2018-05-10: 15:00:00 via INTRAVENOUS

## 2018-05-10 MED ORDER — SENNOSIDES-DOCUSATE SODIUM 8.6-50 MG PO TABS: 1.0000 | ORAL_TABLET | Freq: Every evening | ORAL | Status: DC | PRN

## 2018-05-10 MED ORDER — PROPOFOL 10 MG/ML IV BOLUS
INTRAVENOUS | Status: DC | PRN
  Administered 2018-05-10 (×2): 20 mg via INTRAVENOUS

## 2018-05-10 MED ORDER — IOPAMIDOL (ISOVUE-M 200) INJECTION 41%
INTRAMUSCULAR | Status: DC | PRN
  Administered 2018-05-10: 40 mL

## 2018-05-10 MED ORDER — GABAPENTIN 300 MG PO CAPS
600.0000 mg | ORAL_CAPSULE | Freq: Three times a day (TID) | ORAL | Status: DC
  Administered 2018-05-10 – 2018-05-11 (×2): 600 mg via ORAL
  Filled 2018-05-10 (×2): qty 2

## 2018-05-10 MED ORDER — ONDANSETRON HCL 4 MG PO TABS: 4.0000 mg | ORAL_TABLET | Freq: Four times a day (QID) | ORAL | Status: DC | PRN

## 2018-05-10 MED ORDER — HYDROCODONE-ACETAMINOPHEN 5-325 MG PO TABS: 1.0000 | ORAL_TABLET | ORAL | Status: DC | PRN

## 2018-05-10 MED ORDER — METOCLOPRAMIDE HCL 5 MG/ML IJ SOLN: 5.0000 mg | Freq: Three times a day (TID) | INTRAMUSCULAR | Status: DC | PRN

## 2018-05-10 MED ORDER — POTASSIUM 75 MG PO TABS: 1.0000 | ORAL_TABLET | Freq: Every day | ORAL | Status: DC

## 2018-05-10 MED ORDER — CITALOPRAM HYDROBROMIDE 20 MG PO TABS
10.0000 mg | ORAL_TABLET | Freq: Every day | ORAL | Status: DC
  Administered 2018-05-11: 10 mg via ORAL
  Filled 2018-05-10: qty 1

## 2018-05-10 MED ORDER — ZOLPIDEM TARTRATE 5 MG PO TABS: 5.0000 mg | ORAL_TABLET | Freq: Every evening | ORAL | Status: DC | PRN

## 2018-05-10 MED ORDER — ALENDRONATE SODIUM 70 MG PO TABS: 70.0000 mg | ORAL_TABLET | ORAL | Status: DC

## 2018-05-10 SURGICAL SUPPLY — 16 items
CEMENT KYPHON CX01A KIT/MIXER (Cement) ×3 IMPLANT
COVER WAND RF STERILE (DRAPES) ×3 IMPLANT
DERMABOND ADVANCED (GAUZE/BANDAGES/DRESSINGS) ×2
DERMABOND ADVANCED .7 DNX12 (GAUZE/BANDAGES/DRESSINGS) ×1 IMPLANT
DEVICE BIOPSY BONE KYPH (INSTRUMENTS) ×3 IMPLANT
DRAPE C-ARM XRAY 36X54 (DRAPES) ×3 IMPLANT
DURAPREP 26ML APPLICATOR (WOUND CARE) ×3 IMPLANT
GLOVE SURG SYN 9.0  PF PI (GLOVE) ×2
GLOVE SURG SYN 9.0 PF PI (GLOVE) ×1 IMPLANT
GOWN SRG 2XL LVL 4 RGLN SLV (GOWNS) ×1 IMPLANT
GOWN STRL NON-REIN 2XL LVL4 (GOWNS) ×2
GOWN STRL REUS W/ TWL LRG LVL3 (GOWN DISPOSABLE) ×1 IMPLANT
GOWN STRL REUS W/TWL LRG LVL3 (GOWN DISPOSABLE) ×2
PACK KYPHOPLASTY (MISCELLANEOUS) ×3 IMPLANT
STRAP SAFETY 5IN WIDE (MISCELLANEOUS) ×3 IMPLANT
TRAY KYPHOPAK 15/2 EXPRESS (KITS) ×3 IMPLANT

## 2018-05-10 NOTE — Anesthesia Preprocedure Evaluation (Signed)
Anesthesia Evaluation  Patient identified by MRN, date of birth, ID band Patient awake    Reviewed: Allergy & Precautions, H&P , NPO status , Patient's Chart, lab work & pertinent test results, reviewed documented beta blocker date and time   History of Anesthesia Complications Negative for: history of anesthetic complications  Airway Mallampati: II  TM Distance: >3 FB Neck ROM: full    Dental  (+) Upper Dentures, Edentulous Upper, Partial Lower, Poor Dentition, Dental Advidsory Given   Pulmonary neg pulmonary ROS, former smoker,           Cardiovascular Exercise Tolerance: Good negative cardio ROS       Neuro/Psych PSYCHIATRIC DISORDERS (Dementia and anxiety) Anxiety Depression Dementia negative neurological ROS  negative psych ROS   GI/Hepatic Neg liver ROS, GERD  ,  Endo/Other  neg diabetesHypothyroidism   Renal/GU negative Renal ROS  negative genitourinary   Musculoskeletal negative musculoskeletal ROS (+)   Abdominal   Peds negative pediatric ROS (+)  Hematology negative hematology ROS (+) Blood dyscrasia, anemia ,   Anesthesia Other Findings Past Medical History:   Shingles                                                     Cancer (HCC)                                                   Comment:breast   Hypothyroidism                                               Anxiety                                                      Dementia                                                     Reproductive/Obstetrics negative OB ROS                             Anesthesia Physical  Anesthesia Plan  ASA: II  Anesthesia Plan: General   Post-op Pain Management:    Induction: Intravenous  PONV Risk Score and Plan: 2 and Propofol infusion and TIVA  Airway Management Planned: Natural Airway and Nasal Cannula  Additional Equipment:   Intra-op Plan:   Post-operative Plan:    Informed Consent: I have reviewed the patients History and Physical, chart, labs and discussed the procedure including the risks, benefits and alternatives for the proposed anesthesia with the patient or authorized representative who has indicated his/her understanding and acceptance.   Dental Advisory Given  Plan Discussed with: Anesthesiologist, CRNA and Surgeon  Anesthesia Plan Comments:  Anesthesia Quick Evaluation  

## 2018-05-10 NOTE — Progress Notes (Signed)

## 2018-05-10 NOTE — Op Note (Signed)
05/10/2018  4:13 PM  PATIENT:  Tina [name]  84 y.o. female  PRE-OPERATIVE DIAGNOSIS:  thoracic compression fracture, T8  POST-OPERATIVE DIAGNOSIS:  thoracic compression fracture, T8  PROCEDURE:  Procedure(s): KYPHOPLASTY-T8 (N/A)  SURGEON: Laurene Footman, MD  ASSISTANTS: None  ANESTHESIA:   local and MAC  EBL:  Total I/O In: 250 [I.V.:250] Out: -   BLOOD ADMINISTERED:none  DRAINS: none   LOCAL MEDICATIONS USED:  MARCAINE    and XYLOCAINE   SPECIMEN:  Source of Specimen:  T8 vertebral body  DISPOSITION OF SPECIMEN:  PATHOLOGY  COUNTS:  YES  TOURNIQUET:  * No tourniquets in log *  IMPLANTS: Bone cement  DICTATION: .Dragon Dictation patient was brought to the operating room and after adequate sedation was given, patient was placed prone.  C arm was brought in in both AP and lateral projections with good visualization of the affected level coming up from T12.  After patient identification and timeout procedure were completed, 10 cc of 1% Xylocaine was infiltrated subcutaneously on both sides of T8.  The back was then prepped and draped you sterile fashion and after repeat timeout procedure was carried out, spinal needle was brought down to the pedicle on both sides with a 50-50 mix 1% Xylocaine half percent Sensorcaine with epinephrine total of 15 cc on both sides.  After allowing this to set a small incision was made on the right side and an extrapedicular fashion was utilized to get into the vertebral body with biopsy obtained.  Drilling was carried out and this across the midline so only unit pedicular approach was utilized.  A balloon was inserted and 3 cc of inflation was obtained.  The balloon was let down and when the cement was the appropriate consistency 3 cc of bone cement was inserted without extravasation.  When the cement was set the trochars removed and permanent AP lateral images obtained.  Dermabond was used to close the skin followed by [place]:  Admit for overnight observation  PATIENT DISPOSITION:  PACU - hemodynamically stable.

## 2018-05-10 NOTE — Transfer of Care (Signed)
Immediate Anesthesia Transfer of Care Note  Patient: Tina [name]  Procedure(s) Performed: Manfred Arch (N/A )  Patient Location: PACU  Anesthesia Type:General  Level of Consciousness: awake  Airway & Oxygen Therapy: Patient Spontanous Breathing and Patient connected to nasal cannula oxygen  Post-op Assessment: Report given to RN and Post -op Vital signs reviewed and stable  Post vital signs: Reviewed  Last Vitals:  Vitals Value Taken Time  BP 114/59 05/10/2018  4:11 PM  Temp 37.1 C 05/10/2018  4:11 PM  Pulse 99 05/10/2018  4:12 PM  Resp 13 05/10/2018  4:12 PM  SpO2 100 % 05/10/2018  4:12 PM  Vitals shown include unvalidated device data.  Last Pain:  Vitals:   05/10/18 1447  TempSrc: Tympanic  PainSc: 6          Complications: No apparent anesthesia complications

## 2018-05-10 NOTE — H&P (Signed)
Chief Complaint  Patient presents with  . Spine - Pain, Follow-up    History of the Present Illness: Tina [name] is a 84 y.o. female here for evaluation of her mid back pain.  The patient previously saw Reche Dixon, PA-C in [place], and he ordered an MRI due to her severe mid back pain.  An MRI from [facility] is reviewed by me today and shows an acute T8 compression fracture.    She rates her pain at 7/10 today.  She has been having pain for approximately 1 week.  She is not aware of any acute injury to her back.  Her daughter who presents with her states she has been having excruciating pain when trying to get in and out of bed.    She takes daily aspirin and has not had any medications today.  Her daughter states that the patient did not take the Norco this morning as her mother wanted to be seen before taking anymore medications today.  She has been taking pain medication without much improvement.  I have reviewed past medical, surgical, social and family history, and allergies as documented in the EMR.   Past Medical History:     Past Medical History:  Diagnosis Date  . Cancer (CMS-HCC)    breast  . Depression   . Diverticulosis   . History of colonic polyps   . History of shingles   . Hyperlipidemia   . Hypothyroidism   . Low vitamin B12 level   . Osteoporosis   . Psoriasis    involving her heels    Past Surgical History:      Past Surgical History:  Procedure Laterality Date  . APPENDECTOMY FOR RUPTURED APPENDIX  1970  . Bilateral breast implants  1984  . COLONOSCOPY  10/27/2002  . KYPHOPLASTY  06/27/2015   [facility])  . MASTECTOMY BILATERAL SIMPLE Bilateral 1984   and  reconstruction with breast implants  . OPEN REDUCTION INTERNAL FIXATION (ORIF) WRIST FRACTURE (Bilateral) Bilateral 03/24/2018   Hessie Knows, MD  . SIGMOIDOSCOPY FLEXIBLE  05/31/1997    Past Family  History: FamilyHistory  Family History  Problem Relation Age of Onset  . Heart disease Mother   . Heart disease Father   . Breast cancer Sister       Medications:       Current Outpatient Medications Ordered in Epic  Medication Sig Dispense Refill  . alendronate (FOSAMAX) 70 MG tablet Take 1 tablet (70 mg total) by mouth every 7 (seven) days Take with a full glass of water. Do not lie down for the next 30 min. 12 tablet 3  . aspirin 81 MG EC tablet Take 81 mg by mouth once daily.    Marland Kitchen CALCIUM CARBONATE (CALCIUM 600 ORAL) Take 1,000 mg by mouth once daily.      . citalopram (CELEXA) 10 MG tablet TAKE 1 TABLET(10 MG) BY MOUTH EVERY DAY 90 tablet 1  . diazePAM (VALIUM) 5 MG tablet One tablet 30 minutes before exam and one tablet at time of exam if needed. Must have a driver 2 tablet 0  . dicyclomine (BENTYL) 20 mg tablet Take 1 tablet (20 mg total) by mouth 2 (two) times daily 60 tablet 5  . donepezil (ARICEPT) 10 MG tablet Take 1 tablet (10 mg total) by mouth nightly. 90 tablet 3  . flu vaccine tri 2017-18, PF,, 65 yrs+, (FLUZONE HIGH DOSE) 180 mcg/0.5 mL Syrg IM syringe Fluzone High-Dose 2017-2018 (  PF) 180 mcg/0.5 mL intramuscular syringe  ADM 0.5ML IM UTD    . fluticasone propionate (FLONASE) 50 mcg/actuation nasal spray SHAKE LIQUID AND USE 2 SPRAYS IN EACH NOSTRIL EVERY DAY 16 g 5  . gabapentin (NEURONTIN) 300 MG capsule TAKE 2 CAPSULES(600 MG) BY MOUTH THREE TIMES DAILY 540 capsule 1  . HYDROcodone-acetaminophen (NORCO) 5-325 mg tablet Take 1 tablet by mouth every 6 (six) hours as needed for Pain 40 tablet 0  . levothyroxine (SYNTHROID, LEVOTHROID) 112 MCG tablet Take 1 tablet (112 mcg total) by mouth once daily Take on an empty stomach with a glass of water at least 30-60 minutes before breakfast. 90 tablet 1  . loratadine (CLARITIN) 10 mg capsule Take 10 mg by mouth once daily.    . multivitamin (MULTIVITAMIN) tablet Take 1 tablet by mouth once daily.      .  multivitamin with minerals, EYE, (PRESERVISION AREDS-2) soft gel capsule Take 1 capsule by mouth 2 (two) times daily with meals    . pantoprazole (PROTONIX) 40 MG DR tablet TAKE 1 TABLET BY MOUTH TWICE DAILY 180 tablet 3  . traMADol (ULTRAM) 50 mg tablet Take 1 tablet (50 mg total) by mouth every 6 (six) hours as needed for Pain 40 tablet 1   No current Epic-ordered facility-administered medications on file.     Allergies:      Allergies  Allergen Reactions  . Meloxicam Other (See Comments)    Upper GI Bleeding     Body mass index is 25.4 kg/m.   Review of Systems: A comprehensive 14 point ROS was performed, reviewed, and the pertinent orthopaedic findings are documented in the HPI.   Vitals:   05/09/18 1245  BP: 110/70    General Physical Examination:  General/Constitutional: No apparent distress: well-nourished and well developed. Eyes: Pupils equal, round with synchronous movement. Lungs: Clear to auscultation HEENT: Normal Vascular: No edema, swelling or tenderness, except as noted in detailed exam. Cardiac:  Heart rate and rhythm is regular. Integumentary: No impressive skin lesions present, except as noted in detailed exam. Neuro/Psych: Normal mood and affect, oriented to person, place and time.   Musculoskeletal Examination: On exam, the patient is tender to percussion over T8.  No clonus bilaterally.  Lungs are clear.  Heart rate and rhythm is normal.  HEENT is normal and marked for full upper and partial lower dentures.   Radiographs: Her MRI was reviewed today showing her T8 fracture as noted above.   Assessment:   ICD-10-CM  1. Thoracic compression fracture, closed, initial encounter (CMS-HCC) S22.000A     Plan: We discussed her condition and treatment options at length today.  She has pain and has reduced her activities without improvement.  She has been taking pain medication without improvement.  She would like to  proceed with kyphoplasty for the T8 fracture.  The surgery was discussed at length using a bone model.    Surgical Risks:  The nature of the condition and the proposed procedure has been reviewed in detail with the patient.  Surgical versus non-surgical options and prognosis for recovery have been reviewed and the inherent risks and benefits of each have been discussed including the risks of infection, bleeding, injury to nerves / blood vessels / tendons, incomplete relief of symptoms, persisting pain and / or stiffness, loss of function, complex regional pain syndrome, failure of procedure, as appropriate.  Teeth:   full upper and partial lower dentures   Scribe Attestation: I, Candie Mile, am acting as scribe  for [facility], MD  Reviewed paper H+P, will be scanned into chart. No changes noted.

## 2018-05-10 NOTE — Anesthesia Postprocedure Evaluation (Signed)
Anesthesia Post Note  Patient: Tina [name]  Procedure(s) Performed: Manfred Arch (N/A )  Patient location during evaluation: PACU Anesthesia Type: General Level of consciousness: awake and alert Pain management: pain level controlled Vital Signs Assessment: post-procedure vital signs reviewed and stable Respiratory status: spontaneous breathing, nonlabored ventilation, respiratory function stable and patient connected to nasal cannula oxygen Cardiovascular status: blood pressure returned to baseline and stable Postop Assessment: no apparent nausea or vomiting Anesthetic complications: no     Last Vitals:  Vitals:   05/10/18 1816 05/10/18 2127  BP: 127/72 (!) 102/54  Pulse: 89 87  Resp: 18 16  Temp: 36.6 C 36.6 C  SpO2: 100% 95%    Last Pain:  Vitals:   05/10/18 1816  TempSrc: Oral  PainSc:                  Martha Clan

## 2018-05-10 NOTE — Anesthesia Post-op Follow-up Note (Signed)
Anesthesia QCDR form completed.        

## 2018-05-10 NOTE — H&P (Signed)
Reviewed paper H+P, will be scanned into chart. No changes noted.  

## 2018-05-11 ENCOUNTER — Encounter: Payer: Self-pay | Admitting: Orthopedic Surgery

## 2018-05-11 DIAGNOSIS — M4854XA Collapsed vertebra, not elsewhere classified, thoracic region, initial encounter for fracture: Secondary | ICD-10-CM | POA: Diagnosis not present

## 2018-05-11 MED ORDER — HYDROCODONE-ACETAMINOPHEN 5-325 MG PO TABS: 1.0000 | ORAL_TABLET | ORAL | 0 refills | Status: DC | PRN

## 2018-05-11 NOTE — Discharge Instructions (Signed)
Diet: As you were doing prior to hospitalization   Shower:  May shower  Dressing:  Remove bandaid once you get home.  Activity:  Increase activity slowly as tolerated, but follow the weight bearing instructions below.    Weight Bearing:   Weight bearing as tolerated   To prevent constipation: you may use a stool softener such as -  Colace (over the counter) 100 mg by mouth twice a day  Drink plenty of fluids (prune juice may be helpful) and high fiber foods Miralax (over the counter) for constipation as needed.    Itching:  If you experience itching with your medications, try taking only a single pain pill, or even half a pain pill at a time.  You may take up to 10 pain pills per day, and you can also use benadryl over the counter for itching or also to help with sleep.   Precautions:  If you experience chest pain or shortness of breath - call 911 immediately for transfer to the hospital emergency department!!  If you develop a fever greater that 101 F, purulent drainage from wound, increased redness or drainage from wound, or calf pain-Call [facility]                                             Follow- Up Appointment:  Please call for an appointment to be seen in 2 weeks at [facility] for xrays of thoracic spine

## 2018-05-11 NOTE — Progress Notes (Signed)
Discharge summary reviewed with verbal understanding. 

## 2018-05-11 NOTE — Discharge Summary (Signed)
Physician Discharge Summary  Patient ID: Tina [name] MRN: [id number] DOB/AGE: 07-Mar-1928 84 y.o.  Admit date: 05/10/2018 Discharge date: 05/11/2018  Admission Diagnoses:  thoracic compression fracture   Discharge Diagnoses: Patient Active Problem List   Diagnosis Date Noted  . Status post kyphoplasty 05/10/2018  . Wrist fracture, bilateral 03/23/2018  . Dementia in Alzheimer's disease ([place]) 05/24/2017  . GI bleed 05/15/2017  . Acute blood loss anemia 05/15/2017  . Acute encephalopathy 05/15/2017  . Tachycardia 05/15/2017  . AMD (age related macular degeneration) 01/03/2015  . Allergic rhinitis 12/12/2013  . Depressive disorder, not elsewhere classified 12/12/2013  . Hypothyroidism 12/12/2013  . Postherpetic neuralgia 12/12/2013    Past Medical History:  Diagnosis Date  . Anxiety   . Cancer ([place])    breast  . Dementia ([place])   . Hypothyroidism   . Shingles      Transfusion: none   Consultants (if any):   Discharged Condition: Improved  Hospital Course: Tina [name] is an 84 y.o. female who was admitted 05/10/2018 with a diagnosis of T8 compression fracture and went to the operating room on 05/10/2018 and underwent the above named procedures.    Surgeries: Procedure(s): KYPHOPLASTY-T8 on 05/10/2018 Patient tolerated the surgery well. Taken to PACU where she was stabilized and then transferred to the orthopedic floor.  Post op day 1 back pain improved by 60%. Patient moving around well in bed with no signs of discomfort. PT evaluated patient and recommended discharge home with HHPT. Patient showed good progress with PT with ambulation as well as improved pain  She was given perioperative antibiotics:  Anti-infectives (From admission, onward)   Start     Dose/Rate Route Frequency Ordered Stop   05/10/18 2130  ceFAZolin (ANCEF) IVPB 1 g/50 mL premix     1 g 100 mL/hr over 30 Minutes Intravenous Every 6 hours 05/10/18 1631 05/11/18 1759   05/10/18 1500   ceFAZolin (ANCEF) 1,000 mg in dextrose 5 % 100 mL IVPB  Status:  Discontinued     1,000 mg 220 mL/hr over 30 Minutes Intravenous  Once 05/10/18 1457 05/10/18 1503   05/10/18 1500  ceFAZolin (ANCEF) IVPB 1 g/50 mL premix     1 g 100 mL/hr over 30 Minutes Intravenous 30 min pre-op 05/10/18 1506 05/10/18 1557   05/10/18 1428  ceFAZolin (ANCEF) 1-4 GM/50ML-% IVPB    Note to Pharmacy:  Milinda Cave   : cabinet override      05/10/18 1428 05/10/18 2211    .    She benefited maximally from the hospital stay and there were no complications.    Recent vital signs:  Vitals:   05/10/18 2315 05/11/18 0336  BP: 106/76 98/66  Pulse: 81 80  Resp: 17 16  Temp:  (!) 97.5 F (36.4 C)  SpO2: 96% 97%    Recent laboratory studies:  Lab Results  Component Value Date   HGB 11.4 (L) 03/24/2018   HGB 12.2 03/23/2018   HGB 12.6 06/23/2017   Lab Results  Component Value Date   WBC 10.4 03/24/2018   PLT 163 03/24/2018   Lab Results  Component Value Date   INR 1.14 05/15/2017   Lab Results  Component Value Date   NA 139 03/24/2018   K 3.9 03/24/2018   CL 107 03/24/2018   CO2 26 03/24/2018   BUN 6 (L) 03/24/2018   CREATININE 0.76 03/24/2018   GLUCOSE 90 03/24/2018    Discharge Medications:   Allergies as of 05/11/2018  Reactions   Nsaids Other (See Comments)   H/O GI BLEED   Meloxicam    Other reaction(s): Other (See Comments) Upper GI Bleeding      Medication List    STOP taking these medications   oxyCODONE 5 MG immediate release tablet Commonly known as:  Oxy IR/ROXICODONE     TAKE these medications   acetaminophen 325 MG tablet Commonly known as:  TYLENOL Take 2 tablets (650 mg total) by mouth every 6 (six) hours as needed for mild pain (or Fever >/= 101).   alendronate 70 MG tablet Commonly known as:  FOSAMAX Take 70 mg by mouth every Sunday.   aspirin EC 81 MG tablet Take 1 tablet (81 mg total) by mouth daily.   CALCIUM/D3 ADULT GUMMIES PO Take 2  tablets by mouth 2 (two) times daily. With lunch & supper   citalopram 10 MG tablet Commonly known as:  CELEXA Take 10 mg by mouth daily.   dicyclomine 20 MG tablet Commonly known as:  BENTYL Take 20 mg by mouth 4 (four) times daily.   donepezil 10 MG tablet Commonly known as:  ARICEPT Take 10 mg by mouth at bedtime.   fluticasone 50 MCG/ACT nasal spray Commonly known as:  FLONASE Place 2 sprays into both nostrils at bedtime.   gabapentin 300 MG capsule Commonly known as:  NEURONTIN Take 600 mg 3 (three) times daily by mouth.   HYDROcodone-acetaminophen 5-325 MG tablet Commonly known as:  NORCO/VICODIN Take 1 tablet by mouth every 4 (four) hours as needed for moderate pain (pain score 4-6). What changed:    when to take this  reasons to take this   levothyroxine 112 MCG tablet Commonly known as:  SYNTHROID, LEVOTHROID Take 112 mcg by mouth daily before breakfast.   multivitamin with minerals Tabs tablet Take 1 tablet by mouth daily with lunch. Alive or Centrum Silver   pantoprazole 40 MG tablet Commonly known as:  PROTONIX Take 1 tablet (40 mg total) by mouth 2 (two) times daily.   POTASSIMIN PO Take 1 tablet by mouth at bedtime.   PRESERVISION AREDS 2 PO Take 1 tablet by mouth 2 (two) times daily.   Probiotic 250 MG Caps Take 250 mg by mouth 2 (two) times daily.   ursodiol 250 MG tablet Commonly known as:  ACTIGALL TAKE 1 TABLET(250 MG) BY MOUTH THREE TIMES DAILY WITH MEALS       Diagnostic Studies: Dg Thoracic Spine 2 View  Result Date: 05/10/2018 CLINICAL DATA:  Known T8 fracture EXAM: THORACIC SPINE 2 VIEWS; DG C-ARM 61-120 MIN COMPARISON:  05/07/2018 FLUOROSCOPY TIME:  Fluoroscopy Time:  2 minutes 5 seconds Radiation Exposure Index (if provided by the fluoroscopic device): 4.7 mGy Number of Acquired Spot Images: 8 FINDINGS: Initial images demonstrate localization over the posterior aspect of the T8 vertebral body. Unipedicular approach was then  performed and balloon inflation in the vertebral body performed. Contrast laden cement was then deposited within the vertebral body. IMPRESSION: T8 kyphoplasty. Electronically Signed   By: Inez Catalina M.D.   On: 05/10/2018 16:23   Mr Thoracic Spine Wo Contrast  Result Date: 05/07/2018 CLINICAL DATA:  Fall 1 week ago with back pain. EXAM: MRI THORACIC SPINE WITHOUT CONTRAST TECHNIQUE: Multiplanar, multisequence MR imaging of the thoracic spine was performed. No intravenous contrast was administered. COMPARISON:  Lumbar MRI from 04/26/2015 FINDINGS: Alignment:  No vertebral subluxation is observed. Vertebrae: Chronic 95% compression fracture at T12 with 6 mm posterior bony retropulsion but no appreciable edema.  33% compression fracture at T8 with marrow edema suggesting subacute chronicity. Subtle paraspinal edema also favors a recent fracture. Accentuated low T1 signal in the C7 and adjacent C6 and T1 vertebra the without loss of vertebral height, with associated marrow edema extending into the facets and pedicles. Questionable low inversion recovery transverse signal in the C7 vertebral body on image 6/19. Poor definition of the right paraspinal tissues in this area. Cord:  No cord edema identified.  No abnormal cord lesion noted. Paraspinal and other soft tissues: Indistinct paraspinal soft tissues at C6-C7-T1 eccentric to the right, possibly from paraspinal hematoma or tumor. Disc levels: The lower cervical spine levels are difficult to assess due to spatial resolution limitations. A very large field of view was utilized without commensurate increase in acquisition matrix. The no significant thoracic impingement above the T7-8 level. T7-8: No impingement.  T8 compression noted. T8-9: No impingement. T9-10: Unremarkable. T10-11: No impingement.  Bilateral facet arthropathy. T11-12: No impingement. Posterior bony retropulsion from the posterosuperior endplate of Z32, chronic. T12-L1: Unremarkable. Other: There  is a suggestion of edema signal in the right distal clavicle adjacent to the sternoclavicular joint, significance uncertain. Bilateral renal fluid signal intensity lesions favor cysts. Fullness of the right adrenal gland, nonspecific. IMPRESSION: 1. Subacute compression fracture at T8 with about 33% loss of height, and mild paraspinal edema. No significant impingement at this level. 2. Abnormal reduced T1 signal in C7 and in the adjacent C6 and T1 vertebra, with low-grade edema signal. The appearance could be from trauma or tumor replacement. There may be some faint linear low inversion recovery weighted signal in the C7 vertebra which could indicate a fracture, but there is no overt collapse. There is right paraspinal density in this vicinity which is indistinct but could be from paraspinal hematoma, edema, or tumor. Due to spatial resolution limitations it is difficult to further assess this region. Consider either dedicated MRI of the cervical spine, or CT of the cervical spine, for further characterization. 3. Chronic vertebra plana at T12. Associated bony retropulsion without impingement. 4.  Bilateral renal fluid signal intensity lesions favor cysts. 5. Fullness of the right adrenal gland, nonspecific, but similar to that shown on the lumbar MRI 05/27/2015 Electronically Signed   By: Van Clines M.D.   On: 05/07/2018 14:20   Dg C-arm 1-60 Min  Result Date: 05/10/2018 CLINICAL DATA:  Known T8 fracture EXAM: THORACIC SPINE 2 VIEWS; DG C-ARM 61-120 MIN COMPARISON:  05/07/2018 FLUOROSCOPY TIME:  Fluoroscopy Time:  2 minutes 5 seconds Radiation Exposure Index (if provided by the fluoroscopic device): 4.7 mGy Number of Acquired Spot Images: 8 FINDINGS: Initial images demonstrate localization over the posterior aspect of the T8 vertebral body. Unipedicular approach was then performed and balloon inflation in the vertebral body performed. Contrast laden cement was then deposited within the vertebral body.  IMPRESSION: T8 kyphoplasty. Electronically Signed   By: Inez Catalina M.D.   On: 05/10/2018 16:23    Disposition:     Follow-up Information    Duanne Guess, PA-C Follow up in 2 week(s).   Specialties:  Orthopedic Surgery, Emergency Medicine Contact information: [place] [address] [phone]            Signed: Feliberto Gottron 05/11/2018, 8:42 AM

## 2018-05-11 NOTE — Clinical Social Work Note (Signed)
Clinical Social Work Assessment  Patient Details  Name: Tina [name] MRN: [id number] Date of Birth: 1928/03/19  Date of referral:  05/11/18               Reason for consult:  Discharge Planning                Permission sought to share information with:  Case Manager Permission granted to share information::  Yes, Verbal Permission Granted  Name::        Agency::     Relationship::     Contact Information:     Housing/Transportation Living arrangements for the past 2 months:  Single Family Home Source of Information:  Patient, Adult Children Patient Interpreter Needed:  None Criminal Activity/Legal Involvement Pertinent to Current Situation/Hospitalization:  No - Comment as needed Significant Relationships:  Adult Children Lives with:  Self Do you feel safe going back to the place where you live?  Yes Need for family participation in patient care:  Yes (Comment)  Care giving concerns:  Patient lives alone in [place].    Facilities manager / plan:  Holiday representative ([place]) reviewed chart and noted that patient is post op day 1 from a kyphoplasty. PT is recommending home health however patient's family has concerns about her dementia. CSW met with patient and her daughter Tina [name] [phone] and son in law Tina [name] were at bedside. CSW introduced self and explained role of CSW department. CSW is familiar with patient and family and arranged for her to D/C to [facility] in Sept. 2019. CSW explained to patient and her daughter that medicare will not pay for SNF this time because she is in an outpatient bed and will not meet the 3 night inpatient stay criteria. CSW discussed private pay SNF options. Per daughter patient can't afford to private pay for SNF. CSW dicussed home health options. Per daughter they will take patient home with home health today and prefer [facility]. Per daughter they were open to [facility] last week and would like the same team again. RN case manager aware of above. RN  aware of above. Please reconsult if future socia work needs arise. CSW signing off.    Employment status:  Retired, Disabled (Comment on whether or not currently receiving Disability) Insurance information:  Medicare PT Recommendations:  Home with [place] / Referral to community resources:  Other (Comment Required)(Patient will D/C home with home health. )  Patient/Family's Response to care:  Patient's daughter is agreeable for patient to D/C home with home health today.   Patient/Family's Understanding of and Emotional Response to Diagnosis, Current Treatment, and Prognosis: Patient and her daughter were very pleasant and thanked CSW for assistance.   Emotional Assessment Appearance:  Appears stated age Attitude/Demeanor/Rapport:    Affect (typically observed):  Pleasant Orientation:  Oriented to Self, Oriented to Place, Fluctuating Orientation (Suspected and/or reported [place]) Alcohol / Substance use:  Not Applicable Psych involvement (Current and /or in the community):  No (Comment)  Discharge Needs  Concerns to be addressed:  No discharge needs identified Readmission within the last 30 days:  No Current discharge risk:  None Barriers to Discharge:  No Barriers Identified   Tina [name], Tina Beets, LCSW 05/11/2018, 1:19 PM

## 2018-05-11 NOTE — Progress Notes (Signed)
   Subjective: 1 Day Post-Op Procedure(s) (LRB): KYPHOPLASTY-T8 (N/A) Patient reports pain as mild.  Back pain 60% better Patient is well, and has had no acute complaints or problems Denies any CP, SOB, ABD pain. We will continue therapy today.   Objective: Vital signs in last 24 hours: Temp:  [97.5 F (36.4 C)-98.8 F (37.1 C)] 97.5 F (36.4 C) (11/13 0336) Pulse Rate:  [80-98] 80 (11/13 0336) Resp:  [13-31] 16 (11/13 0336) BP: (98-127)/(54-89) 98/66 (11/13 0336) SpO2:  [95 %-100 %] 97 % (11/13 0336) FiO2 (%):  [28 %] 28 % (11/12 1808) Weight:  [68 kg-68.9 kg] 68.9 kg (11/13 0534)  Intake/Output from previous day: 11/12 0701 - 11/13 0700 In: 490.6 [I.V.:442.3; IV Piggyback:48.3] Out: -  Intake/Output this shift: No intake/output data recorded.  No results for input(s): HGB in the last 72 hours. No results for input(s): WBC, RBC, HCT, PLT in the last 72 hours. No results for input(s): NA, K, CL, CO2, BUN, CREATININE, GLUCOSE, CALCIUM in the last 72 hours. No results for input(s): LABPT, INR in the last 72 hours.  EXAM General - Patient is Alert, Appropriate and Oriented  Thoracic spine-no swelling warmth redness or ecchymosis.  No tenderness palpation.  Band-Aid intact. Extremity - Neurovascular intact Sensation intact distally Intact pulses distally Dorsiflexion/Plantar flexion intact No cellulitis present Dressing - dressing C/D/I and no drainage Motor Function - intact, moving foot and toes well on exam.   Past Medical History:  Diagnosis Date  . Anxiety   . Cancer ([place])    breast  . Dementia ([place])   . Hypothyroidism   . Shingles     Assessment/Plan:   1 Day Post-Op Procedure(s) (LRB): KYPHOPLASTY-T8 (N/A) Active Problems:   Status post kyphoplasty  Estimated body mass index is 29.69 kg/m as calculated from the following:   Height as of this encounter: 5' (1.524 m).   Weight as of this encounter: 68.9 kg. Advance diet Up with therapy, activity as  tolerated Plan on discharge to home today if good progress with physical therapy and improved pain.   Ronney Asters, PA-C [place] 05/11/2018, 8:17 AM

## 2018-05-11 NOTE — Evaluation (Signed)
Physical Therapy Evaluation Patient Details Name: Tina [name] MRN: [id number] DOB: June 28, 1928 Today's Date: 05/11/2018   History of Present Illness  Pt is 84 yo female s/p kyphoplasty (T8). PMH of dementia, anxiety/depression, breast cx, hypothyrodism   Clinical Impression  Patient oriented to self, disoriented to situation, time and place, but agreeable to work with physical therapy. Patient is poor historian to provide PLOF, PT spoke with family. Family reports that pt lives alone with 4 STE with rails, and 1 step in home without rails. Family only available PRN (son lives nearby), but works and is only able to assist on the weekends. Currently son performs IADLs. Family reports that pt uses RW in home, performs ADLs independently. Family is concerned about patient safety at home, reports multiple falls a day due to weakness, balance, and pain per patient. Patient demonstrated bed mobility mod I (extended time, use of bed rails). Sit <> stand with supervision/CGA. Pt ambulated in hall ~233ft total with IV pole with unilateral and bilateral UE support, exhibited shuffling gait, kyphotic posture, but steady. Performed stair navigation with bilateral rails, step over step pattern and supervision/CGA. Pt educated on log roll technique and back precautions with poor carry over due to dementia. The patient also needed cueing for pacing of activities throughout session to encourage safety and maximize endurance. The patient would benefit from skilled PT to address limitations in mobility, gait, balance, as well as further education on precautions and safety in home. Current recommendation is HHPT and supervision for mobility/OOB due to history of falls. If unable to provide adequate supervision, STR could be considered for patient safety, continued education, and to maximize independence/mobility.    Follow Up Recommendations Home health PT;Supervision for mobility/OOB    Equipment Recommendations  None  recommended by PT(Pt family reports that they have a RW, bedside commode at home.)    Recommendations for Other Services       Precautions / Restrictions Precautions Precautions: Back;Fall Precaution Booklet Issued: No Restrictions Weight Bearing Restrictions: No(WBAT as tolerated)      Mobility  Bed Mobility Overal bed mobility: Modified Independent                Transfers Overall transfer level: Needs assistance Equipment used: None;1 person hand held assist Transfers: Sit to/from Stand Sit to Stand: Supervision;Min guard            Ambulation/Gait   Gait Distance (Feet): 200 Feet Assistive device: IV Pole;None       General Gait Details: Pt demonstrated shuffling gait, reaches for support. Able to ambulate with no AD, improved gait velocity and steadiness noted with use of IV with unilateral UE support, pt comfortably used bilateral UE support  on IV pole for last ~30ft.  Stairs Stairs: Yes Stairs assistance: Supervision;Min guard Stair Management: Two rails;Alternating pattern Number of Stairs: 4    Wheelchair Mobility    Modified Rankin (Stroke Patients Only)       Balance Overall balance assessment: Mild deficits observed, not formally tested                                           Pertinent Vitals/Pain Pain Assessment: No/denies pain    Home Living Family/patient expects to be discharged to:: Private residence Living Arrangements: Alone Available Help at Discharge: Family;Available PRN/intermittently Type of Home: House Home Access: Stairs to enter   [employer] of  Steps: 2 Home Layout: One level Home Equipment: Walker - 2 wheels;Grab bars - tub/shower;Shower seat Additional Comments: Family provides history.     Prior Function Level of Independence: Needs assistance   Gait / Transfers Assistance Needed: Per family, patient uses RW in home.  ADL's / Homemaking Assistance Needed: Patient needs  assistance from relatives to perform IADLs, cooking, cleaning  Comments: Family has significant concerns about safety at home. Reports multiple falls a day. States there is a step in the house between kitchen/sunroom where she often falls.      Hand Dominance   Dominant Hand: Right    Extremity/Trunk Assessment   Upper Extremity Assessment Upper Extremity Assessment: Overall WFL for tasks assessed    Lower Extremity Assessment Lower Extremity Assessment: Generalized weakness    Cervical / Trunk Assessment Cervical / Trunk Assessment: Kyphotic  Communication   Communication: HOH  Cognition Arousal/Alertness: Awake/alert Behavior During Therapy: WFL for tasks assessed/performed Overall Cognitive Status: No family/caregiver present to determine baseline cognitive functioning                                        General Comments      Exercises Other Exercises Other Exercises: Pt instructed in log roll technique to maintain back precautions. Educated on avoiding/preventing bending/lifting/twisting of back.   Assessment/Plan    PT Assessment Patient needs continued PT services  PT Problem List Decreased strength;Decreased knowledge of use of DME;Decreased activity tolerance;Decreased mobility;Decreased knowledge of precautions       PT Treatment Interventions DME instruction;Balance training;Gait training;Neuromuscular re-education;Stair training;Functional mobility training;Patient/family education;Therapeutic activities;Therapeutic exercise    PT Goals (Current goals can be found in the Care Plan section)  Acute Rehab PT Goals Patient Stated Goal: Patient would like to go home PT Goal Formulation: With patient Time For Goal Achievement: 05/25/18 Potential to Achieve Goals: Good    Frequency Min 2X/week   Barriers to discharge        Co-evaluation               AM-PAC PT "6 Clicks" Daily Activity  Outcome Measure Difficulty turning over  in bed (including adjusting bedclothes, sheets and blankets)?: None Difficulty moving from lying on back to sitting on the side of the bed? : A Little Difficulty sitting down on and standing up from a chair with arms (e.g., wheelchair, bedside commode, etc,.)?: A Little Help needed moving to and from a bed to chair (including a wheelchair)?: A Little Help needed walking in hospital room?: A Little Help needed climbing 3-5 steps with a railing? : A Little 6 Click Score: 19    End of Session Equipment Utilized During Treatment: Gait belt Activity Tolerance: Patient tolerated treatment well Patient left: with chair alarm set;in chair;with call bell/phone within reach;with SCD's reapplied;Other (comment)(heels elevated) Nurse Communication: Mobility status PT Visit Diagnosis: Other abnormalities of gait and mobility (R26.89);Difficulty in walking, not elsewhere classified (R26.2)    Time: 5188-4166 PT Time Calculation (min) (ACUTE ONLY): 32 min   Charges:   PT Evaluation $PT Eval Low Complexity: 1 Low PT Treatments $Therapeutic Activity: 8-22 mins        Lieutenant Diego PT, DPT 10:49 AM,05/11/18 [phone]

## 2018-05-12 LAB — SURGICAL PATHOLOGY

## 2018-05-23 ENCOUNTER — Emergency Department: Payer: Medicare Other

## 2018-05-23 ENCOUNTER — Encounter: Payer: Self-pay | Admitting: Emergency Medicine

## 2018-05-23 ENCOUNTER — Inpatient Hospital Stay
Admission: EM | Admit: 2018-05-23 | Discharge: 2018-05-27 | DRG: 641 | Disposition: A | Discharge status: Home Health | Payer: Medicare Other | Attending: Internal Medicine | Admitting: Internal Medicine

## 2018-05-23 ENCOUNTER — Other Ambulatory Visit: Payer: Self-pay

## 2018-05-23 DIAGNOSIS — G309 Alzheimer's disease, unspecified: Secondary | ICD-10-CM | POA: Diagnosis present

## 2018-05-23 DIAGNOSIS — Z515 Encounter for palliative care: Secondary | ICD-10-CM | POA: Diagnosis not present

## 2018-05-23 DIAGNOSIS — R296 Repeated falls: Secondary | ICD-10-CM | POA: Diagnosis present

## 2018-05-23 DIAGNOSIS — Z888 Allergy status to other drugs, medicaments and biological substances status: Secondary | ICD-10-CM

## 2018-05-23 DIAGNOSIS — H3530 Unspecified macular degeneration: Secondary | ICD-10-CM | POA: Diagnosis present

## 2018-05-23 DIAGNOSIS — R443 Hallucinations, unspecified: Secondary | ICD-10-CM | POA: Diagnosis present

## 2018-05-23 DIAGNOSIS — Z7951 Long term (current) use of inhaled steroids: Secondary | ICD-10-CM

## 2018-05-23 DIAGNOSIS — Z791 Long term (current) use of non-steroidal anti-inflammatories (NSAID): Secondary | ICD-10-CM

## 2018-05-23 DIAGNOSIS — E785 Hyperlipidemia, unspecified: Secondary | ICD-10-CM | POA: Diagnosis present

## 2018-05-23 DIAGNOSIS — B0229 Other postherpetic nervous system involvement: Secondary | ICD-10-CM | POA: Diagnosis present

## 2018-05-23 DIAGNOSIS — Z8249 Family history of ischemic heart disease and other diseases of the circulatory system: Secondary | ICD-10-CM

## 2018-05-23 DIAGNOSIS — R32 Unspecified urinary incontinence: Secondary | ICD-10-CM | POA: Diagnosis present

## 2018-05-23 DIAGNOSIS — F0280 Dementia in other diseases classified elsewhere without behavioral disturbance: Secondary | ICD-10-CM | POA: Diagnosis present

## 2018-05-23 DIAGNOSIS — E860 Dehydration: Secondary | ICD-10-CM | POA: Diagnosis present

## 2018-05-23 DIAGNOSIS — Z853 Personal history of malignant neoplasm of breast: Secondary | ICD-10-CM

## 2018-05-23 DIAGNOSIS — F05 Delirium due to known physiological condition: Secondary | ICD-10-CM | POA: Diagnosis present

## 2018-05-23 DIAGNOSIS — E039 Hypothyroidism, unspecified: Secondary | ICD-10-CM | POA: Diagnosis present

## 2018-05-23 DIAGNOSIS — Z79899 Other long term (current) drug therapy: Secondary | ICD-10-CM

## 2018-05-23 DIAGNOSIS — R1114 Bilious vomiting: Secondary | ICD-10-CM | POA: Diagnosis not present

## 2018-05-23 DIAGNOSIS — Z66 Do not resuscitate: Secondary | ICD-10-CM | POA: Diagnosis present

## 2018-05-23 DIAGNOSIS — Z91011 Allergy to milk products: Secondary | ICD-10-CM

## 2018-05-23 DIAGNOSIS — R188 Other ascites: Secondary | ICD-10-CM | POA: Diagnosis present

## 2018-05-23 DIAGNOSIS — F17210 Nicotine dependence, cigarettes, uncomplicated: Secondary | ICD-10-CM | POA: Diagnosis present

## 2018-05-23 DIAGNOSIS — S2242XA Multiple fractures of ribs, left side, initial encounter for closed fracture: Secondary | ICD-10-CM | POA: Diagnosis present

## 2018-05-23 DIAGNOSIS — Z9181 History of falling: Secondary | ICD-10-CM

## 2018-05-23 DIAGNOSIS — Z79890 Hormone replacement therapy (postmenopausal): Secondary | ICD-10-CM

## 2018-05-23 DIAGNOSIS — L409 Psoriasis, unspecified: Secondary | ICD-10-CM | POA: Diagnosis present

## 2018-05-23 DIAGNOSIS — L8990 Pressure ulcer of unspecified site, unspecified stage: Secondary | ICD-10-CM

## 2018-05-23 DIAGNOSIS — W19XXXA Unspecified fall, initial encounter: Secondary | ICD-10-CM | POA: Diagnosis present

## 2018-05-23 DIAGNOSIS — K743 Primary biliary cirrhosis: Secondary | ICD-10-CM | POA: Diagnosis present

## 2018-05-23 DIAGNOSIS — M810 Age-related osteoporosis without current pathological fracture: Secondary | ICD-10-CM | POA: Diagnosis present

## 2018-05-23 DIAGNOSIS — Z7189 Other specified counseling: Secondary | ICD-10-CM | POA: Diagnosis not present

## 2018-05-23 DIAGNOSIS — Z886 Allergy status to analgesic agent status: Secondary | ICD-10-CM | POA: Diagnosis not present

## 2018-05-23 DIAGNOSIS — R197 Diarrhea, unspecified: Secondary | ICD-10-CM | POA: Diagnosis present

## 2018-05-23 DIAGNOSIS — I959 Hypotension, unspecified: Secondary | ICD-10-CM | POA: Diagnosis present

## 2018-05-23 DIAGNOSIS — R52 Pain, unspecified: Secondary | ICD-10-CM | POA: Diagnosis present

## 2018-05-23 DIAGNOSIS — Z7982 Long term (current) use of aspirin: Secondary | ICD-10-CM

## 2018-05-23 DIAGNOSIS — K8020 Calculus of gallbladder without cholecystitis without obstruction: Secondary | ICD-10-CM | POA: Diagnosis present

## 2018-05-23 DIAGNOSIS — N179 Acute kidney failure, unspecified: Secondary | ICD-10-CM | POA: Diagnosis not present

## 2018-05-23 DIAGNOSIS — F419 Anxiety disorder, unspecified: Secondary | ICD-10-CM | POA: Diagnosis present

## 2018-05-23 DIAGNOSIS — R112 Nausea with vomiting, unspecified: Secondary | ICD-10-CM

## 2018-05-23 LAB — CBC WITH DIFFERENTIAL/PLATELET
ABS IMMATURE GRANULOCYTES: 0.07 10*3/uL (ref 0.00–0.07)
BASOS ABS: 0.1 10*3/uL (ref 0.0–0.1)
BASOS PCT: 1 %
Eosinophils Absolute: 0.3 10*3/uL (ref 0.0–0.5)
Eosinophils Relative: 3 %
HCT: 37.4 % (ref 36.0–46.0)
HEMOGLOBIN: 12.3 g/dL (ref 12.0–15.0)
IMMATURE GRANULOCYTES: 1 %
LYMPHS ABS: 1.5 10*3/uL (ref 0.7–4.0)
Lymphocytes Relative: 14 %
MCH: 28.7 pg (ref 26.0–34.0)
MCHC: 32.9 g/dL (ref 30.0–36.0)
MCV: 87.2 fL (ref 80.0–100.0)
MONOS PCT: 10 %
Monocytes Absolute: 1 10*3/uL (ref 0.1–1.0)
NEUTROS PCT: 71 %
NRBC: 0 % (ref 0.0–0.2)
Neutro Abs: 7.7 10*3/uL (ref 1.7–7.7)
PLATELETS: 266 10*3/uL (ref 150–400)
RBC: 4.29 MIL/uL (ref 3.87–5.11)
RDW: 16.2 % — ABNORMAL HIGH (ref 11.5–15.5)
WBC: 10.6 10*3/uL — ABNORMAL HIGH (ref 4.0–10.5)

## 2018-05-23 LAB — BASIC METABOLIC PANEL
ANION GAP: 8 (ref 5–15)
BUN: 18 mg/dL (ref 8–23)
CALCIUM: 8.1 mg/dL — AB (ref 8.9–10.3)
CO2: 27 mmol/L (ref 22–32)
CREATININE: 1.27 mg/dL — AB (ref 0.44–1.00)
Chloride: 101 mmol/L (ref 98–111)
GFR, EST AFRICAN AMERICAN: 42 mL/min — AB (ref >60)
GFR, EST NON AFRICAN AMERICAN: 37 mL/min — AB (ref >60)
GLUCOSE: 113 mg/dL — AB (ref 70–99)
Potassium: 3.6 mmol/L (ref 3.5–5.1)
Sodium: 136 mmol/L (ref 135–145)

## 2018-05-23 LAB — URINALYSIS, COMPLETE (UACMP) WITH MICROSCOPIC
Bilirubin Urine: NEGATIVE
GLUCOSE, UA: NEGATIVE mg/dL
Hgb urine dipstick: NEGATIVE
Ketones, ur: NEGATIVE mg/dL
LEUKOCYTES UA: NEGATIVE
Nitrite: NEGATIVE
PH: 6 (ref 5.0–8.0)
Protein, ur: NEGATIVE mg/dL
Specific Gravity, Urine: 1.011 (ref 1.005–1.030)

## 2018-05-23 MED ORDER — DICYCLOMINE HCL 20 MG PO TABS
20.0000 mg | ORAL_TABLET | Freq: Four times a day (QID) | ORAL | Status: DC
  Administered 2018-05-23 – 2018-05-27 (×15): 20 mg via ORAL
  Filled 2018-05-23 (×18): qty 1

## 2018-05-23 MED ORDER — SODIUM CHLORIDE 0.9 % IV BOLUS
1000.0000 mL | Freq: Once | INTRAVENOUS | Status: AC
  Administered 2018-05-23: 1000 mL via INTRAVENOUS

## 2018-05-23 MED ORDER — LEVOTHYROXINE SODIUM 112 MCG PO TABS
112.0000 ug | ORAL_TABLET | Freq: Every day | ORAL | Status: DC
  Administered 2018-05-24 – 2018-05-27 (×4): 112 ug via ORAL
  Filled 2018-05-23 (×5): qty 1

## 2018-05-23 MED ORDER — SODIUM CHLORIDE 0.9 % IV SOLN
INTRAVENOUS | Status: DC
  Administered 2018-05-23 – 2018-05-24 (×2): via INTRAVENOUS

## 2018-05-23 MED ORDER — RISAQUAD PO CAPS
1.0000 | ORAL_CAPSULE | Freq: Two times a day (BID) | ORAL | Status: DC
  Administered 2018-05-23 – 2018-05-27 (×8): 1 via ORAL
  Filled 2018-05-23 (×8): qty 1

## 2018-05-23 MED ORDER — ADULT MULTIVITAMIN W/MINERALS CH
1.0000 | ORAL_TABLET | Freq: Every day | ORAL | Status: DC
  Administered 2018-05-24 – 2018-05-27 (×4): 1 via ORAL
  Filled 2018-05-23 (×5): qty 1

## 2018-05-23 MED ORDER — ASPIRIN EC 81 MG PO TBEC
81.0000 mg | DELAYED_RELEASE_TABLET | Freq: Every day | ORAL | Status: DC
  Administered 2018-05-23 – 2018-05-27 (×5): 81 mg via ORAL
  Filled 2018-05-23 (×5): qty 1

## 2018-05-23 MED ORDER — ACETAMINOPHEN 650 MG RE SUPP: 650.0000 mg | Freq: Four times a day (QID) | RECTAL | Status: DC | PRN

## 2018-05-23 MED ORDER — ONDANSETRON HCL 4 MG/2ML IJ SOLN
4.0000 mg | Freq: Four times a day (QID) | INTRAMUSCULAR | Status: DC | PRN
  Administered 2018-05-25: 4 mg via INTRAVENOUS
  Filled 2018-05-23: qty 2

## 2018-05-23 MED ORDER — DONEPEZIL HCL 5 MG PO TABS
10.0000 mg | ORAL_TABLET | Freq: Every day | ORAL | Status: DC
  Administered 2018-05-24 – 2018-05-26 (×3): 10 mg via ORAL
  Filled 2018-05-23 (×4): qty 2

## 2018-05-23 MED ORDER — HALOPERIDOL LACTATE 5 MG/ML IJ SOLN
1.0000 mg | Freq: Four times a day (QID) | INTRAMUSCULAR | Status: DC | PRN
  Administered 2018-05-24 – 2018-05-25 (×2): 1 mg via INTRAVENOUS
  Filled 2018-05-23 (×2): qty 1

## 2018-05-23 MED ORDER — ACETAMINOPHEN 325 MG PO TABS
650.0000 mg | ORAL_TABLET | Freq: Four times a day (QID) | ORAL | Status: DC | PRN
  Administered 2018-05-23 – 2018-05-24 (×2): 650 mg via ORAL
  Filled 2018-05-23 (×2): qty 2

## 2018-05-23 MED ORDER — ACETAMINOPHEN 325 MG PO TABS: 650.0000 mg | ORAL_TABLET | Freq: Four times a day (QID) | ORAL | Status: DC | PRN

## 2018-05-23 MED ORDER — GABAPENTIN 100 MG PO CAPS
100.0000 mg | ORAL_CAPSULE | Freq: Three times a day (TID) | ORAL | Status: DC
  Administered 2018-05-23 – 2018-05-27 (×12): 100 mg via ORAL
  Filled 2018-05-23 (×12): qty 1

## 2018-05-23 MED ORDER — FLUTICASONE PROPIONATE 50 MCG/ACT NA SUSP
2.0000 | Freq: Every evening | NASAL | Status: DC | PRN
  Administered 2018-05-26 – 2018-05-27 (×2): 2 via NASAL
  Filled 2018-05-23: qty 16

## 2018-05-23 MED ORDER — OCUVITE-LUTEIN PO CAPS
1.0000 | ORAL_CAPSULE | Freq: Two times a day (BID) | ORAL | Status: DC
  Administered 2018-05-23 – 2018-05-27 (×8): 1 via ORAL
  Filled 2018-05-23 (×9): qty 1

## 2018-05-23 MED ORDER — CITALOPRAM HYDROBROMIDE 20 MG PO TABS
10.0000 mg | ORAL_TABLET | Freq: Every day | ORAL | Status: DC
  Administered 2018-05-24 – 2018-05-27 (×4): 10 mg via ORAL
  Filled 2018-05-23 (×4): qty 1

## 2018-05-23 MED ORDER — ENOXAPARIN SODIUM 30 MG/0.3ML ~~LOC~~ SOLN
30.0000 mg | SUBCUTANEOUS | Status: DC
  Administered 2018-05-23 – 2018-05-24 (×2): 30 mg via SUBCUTANEOUS
  Filled 2018-05-23 (×2): qty 0.3

## 2018-05-23 MED ORDER — PANTOPRAZOLE SODIUM 40 MG PO TBEC
40.0000 mg | DELAYED_RELEASE_TABLET | Freq: Two times a day (BID) | ORAL | Status: DC
  Administered 2018-05-23 – 2018-05-27 (×8): 40 mg via ORAL
  Filled 2018-05-23 (×8): qty 1

## 2018-05-23 MED ORDER — ONDANSETRON HCL 4 MG PO TABS: 4.0000 mg | ORAL_TABLET | Freq: Four times a day (QID) | ORAL | Status: DC | PRN

## 2018-05-23 MED ORDER — SODIUM CHLORIDE 0.9 % IV BOLUS
500.0000 mL | Freq: Once | INTRAVENOUS | Status: AC
  Administered 2018-05-23: 21:00:00 500 mL via INTRAVENOUS

## 2018-05-23 NOTE — ED Notes (Signed)
Patient transported to CT 

## 2018-05-23 NOTE — ED Notes (Signed)
ED TO INPATIENT HANDOFF REPORT  Name/Age/Gender Tina [name] 84 y.o. female  Code Status    Code Status Orders  (From admission, onward)         Start     Ordered   05/23/18 1545  Full code  Continuous     05/23/18 1544        Code Status History    Date Active Date Inactive Code Status Order ID Comments User Context   05/10/2018 1807 05/11/2018 1805 DNR [id number]  Hessie Knows, MD Inpatient   05/10/2018 1631 05/10/2018 1807 Full Code [id number]  Hessie Knows, MD Inpatient   03/23/2018 2050 03/26/2018 1705 Full Code [id number]  Loletha Grayer, MD ED   05/15/2017 1451 05/18/2017 1446 Full Code [id number]  Idelle Crouch, MD Inpatient   06/27/2015 1557 06/27/2015 1932 Full Code [id number]  Hessie Knows, MD Inpatient    Advance Directive Documentation     Most Recent Value  Type of Advance Directive  Healthcare Power of Attorney  Pre-existing out of facility DNR order (yellow form or pink MOST form)  -  "MOST" Form in Place?  -      Home/SNF/Other Possible short term rehab  Chief Complaint Fall   Level of Care/Admitting Diagnosis ED Disposition    ED Disposition Condition [place]: [place] [[id number]]  Level of Care: Med-Surg [16]  Diagnosis: Acute kidney injury [facility]) [[id number]]  Admitting Physician: Loletha Grayer [[id number]]  Attending Physician: Loletha Grayer [phone]  Estimated length of stay: past midnight tomorrow  Certification:: I certify this patient will need inpatient services for at least 2 midnights  PT Class (Do Not Modify): Inpatient [101]  PT Acc Code (Do Not Modify): Private [1]       Medical History Past Medical History:  Diagnosis Date  . Anxiety   . Cancer ([place])    breast  . Dementia ([place])   . Hypothyroidism   . Shingles     Allergies Allergies  Allergen Reactions  . Nsaids Other (See Comments)    H/O GI BLEED  . Meloxicam     Other reaction(s): Other (See Comments) Upper GI  Bleeding    IV Location/Drains/Wounds Patient Lines/Drains/Airways Status   Active Line/Drains/Airways    Name:   Placement date:   Placement time:   Site:   Days:   Peripheral IV 03/24/18 Upper;Right Arm   03/24/18    0821    Arm   60   Peripheral IV Left Antecubital   -    -    Antecubital      Peripheral IV 05/23/18 Left Antecubital   05/23/18    1133    Antecubital   less than 1   Airway   05/10/18    1450     13   Incision (Closed) 03/24/18 Arm   03/24/18    1255     60   Incision (Closed) 05/10/18 Back   05/10/18    1605     13          Labs/Imaging Results for orders placed or performed during the hospital encounter of 05/23/18 (from the past 48 hour(s))  Basic metabolic panel     Status: Abnormal   Collection Time: 05/23/18 11:35 AM  Result Value Ref Range   Sodium 136 135 - 145 mmol/L   Potassium 3.6 3.5 - 5.1 mmol/L   Chloride 101 98 - 111 mmol/L   CO2 27 22 - 32 mmol/L  Glucose, Bld 113 (H) 70 - 99 mg/dL   BUN 18 8 - 23 mg/dL   Creatinine, Ser 1.27 (H) 0.44 - 1.00 mg/dL   Calcium 8.1 (L) 8.9 - 10.3 mg/dL   GFR calc non Af Amer 37 (L) >60 mL/min   GFR calc Af Amer 42 (L) >60 mL/min    Comment: (NOTE) The eGFR has been calculated using the CKD EPI equation. This calculation has not been validated in all clinical situations. eGFR's persistently <60 mL/min signify possible Chronic Kidney Disease.    Anion gap 8 5 - 15    Comment: Performed at [facility], [address] [place], [place] [phone]  CBC with Differential     Status: Abnormal   Collection Time: 05/23/18 11:35 AM  Result Value Ref Range   WBC 10.6 (H) 4.0 - 10.5 K/uL   RBC 4.29 3.87 - 5.11 MIL/uL   Hemoglobin 12.3 12.0 - 15.0 g/dL   HCT 37.4 36.0 - 46.0 %   MCV 87.2 80.0 - 100.0 fL   MCH 28.7 26.0 - 34.0 pg   MCHC 32.9 30.0 - 36.0 g/dL   RDW 16.2 (H) 11.5 - 15.5 %   Platelets 266 150 - 400 K/uL   nRBC 0.0 0.0 - 0.2 %   Neutrophils Relative % 71 %   Neutro Abs 7.7 1.7 - 7.7 K/uL    Lymphocytes Relative 14 %   Lymphs Abs 1.5 0.7 - 4.0 K/uL   Monocytes Relative 10 %   Monocytes Absolute 1.0 0.1 - 1.0 K/uL   Eosinophils Relative 3 %   Eosinophils Absolute 0.3 0.0 - 0.5 K/uL   Basophils Relative 1 %   Basophils Absolute 0.1 0.0 - 0.1 K/uL   Immature Granulocytes 1 %   Abs Immature Granulocytes 0.07 0.00 - 0.07 K/uL    Comment: Performed at [facility], [address] [place], [place] [address]  Urinalysis, Complete w Microscopic     Status: Abnormal   Collection Time: 05/23/18  2:43 PM  Result Value Ref Range   Color, Urine YELLOW (A) YELLOW   APPearance CLEAR (A) CLEAR   Specific Gravity, Urine 1.011 1.005 - 1.030   pH 6.0 5.0 - 8.0   Glucose, UA NEGATIVE NEGATIVE mg/dL   Hgb urine dipstick NEGATIVE NEGATIVE   Bilirubin Urine NEGATIVE NEGATIVE   Ketones, ur NEGATIVE NEGATIVE mg/dL   Protein, ur NEGATIVE NEGATIVE mg/dL   Nitrite NEGATIVE NEGATIVE   Leukocytes, UA NEGATIVE NEGATIVE   RBC / HPF 0-5 0 - 5 RBC/hpf   WBC, UA 0-5 0 - 5 WBC/hpf   Bacteria, UA RARE (A) NONE SEEN   Squamous Epithelial / LPF 0-5 0 - 5    Comment: Performed at [facility], [address], [place], [place] [address]   Dg Chest 2 View  Result Date: 05/23/2018 CLINICAL DATA:  Left-sided rib cage pain following an unwitnessed fall 2 days ago. History of dementia. EXAM: CHEST - 2 VIEW COMPARISON:  Chest x-ray of May 15, 2017 FINDINGS: The lungs are adequately inflated. There is mild chronic elevation of the left hemidiaphragm. There is no focal infiltrate. There is a small pleural effusion layering posteriorly on the left. There is no pneumothorax. The heart and pulmonary vascularity are normal. There is tortuosity of the ascending and descending thoracic aorta with mural calcification. The observed left ribs exhibit no acute abnormalities. There is chronic compression of T8 and T12. T8 has been treated with kyphoplasty. IMPRESSION: Chronic mild elevation  of the  left hemidiaphragm. New small left pleural effusion layering posteriorly. No pneumothorax nor definite pulmonary contusion. If there are strong clinical concerns of an occult left rib fracture, a dedicated left rib series would be useful. Thoracic aortic atherosclerosis. Electronically Signed   By: David  Martinique M.D.   On: 05/23/2018 12:03   Ct Head Wo Contrast  Result Date: 05/23/2018 CLINICAL DATA:  Recent fall EXAM: CT HEAD WITHOUT CONTRAST TECHNIQUE: Contiguous axial images were obtained from the base of the skull through the vertex without intravenous contrast. COMPARISON:  05/15/2017 FINDINGS: Brain: Diffuse atrophic changes and chronic white matter ischemic changes are seen. New bilateral subdural hygromas are noted. No acute hemorrhage is seen. No findings to suggest acute infarct are noted. Vascular: No hyperdense vessel or unexpected calcification. Skull: Normal. Negative for fracture or focal lesion. Sinuses/Orbits: No acute finding. Other: None. IMPRESSION: Chronic atrophic and ischemic changes with bilateral subdural hygromas. No acute hemorrhage is seen. Electronically Signed   By: Inez Catalina M.D.   On: 05/23/2018 15:23   Ct Chest Wo Contrast  Result Date: 05/23/2018 CLINICAL DATA:  Fall on Saturday, found down. History of dementia. Continued complaints of pain to LEFT ribs. Bruising to LEFT side. EXAM: CT CHEST WITHOUT CONTRAST TECHNIQUE: Multidetector CT imaging of the chest was performed following the standard protocol without IV contrast. COMPARISON:  Chest x-ray dated 05/23/2018. FINDINGS: Cardiovascular: Heart size is normal. No pericardial effusion. Diffuse coronary artery calcifications. Aortic atherosclerosis. No thoracic aortic aneurysm. Mediastinum/Nodes: No hemorrhage or edema within the mediastinum. No mass or enlarged lymph nodes seen within the mediastinum or perihilar regions. Esophagus is unremarkable, possible small hiatal hernia. Lungs/Pleura: Small consolidations at the  lung bases posteriorly, LEFT slightly greater than RIGHT, most likely atelectasis. Small bilateral pleural effusions. 7 mm irregular pulmonary nodule, perifissural, at upper margin of the RIGHT middle lobe (series 3, image 73). 12 mm irregular nodular density within the posteromedial LEFT lower lobe (series 3, image 71). 6 mm nodule within the posterior portion of the RIGHT upper lobe (series 3, image 34), suspected scarring or atelectasis. 7 mm irregular nodular density within the posterior portion of the RIGHT upper lobe (series 3, image 29), suspected scarring or atelectasis. 6 mm irregular nodular density within the RIGHT lung apex (series 3, image 22), suspected scarring or atelectasis. Upper Abdomen: Cirrhotic appearing liver. Moderate amount of ascites within the upper abdomen, incompletely imaged. Bilateral renal cysts. Diverticulosis at the splenic flexure. Musculoskeletal: Acute appearing fractures of the posterolateral LEFT ninth and tenth ribs. Additional probably acute fracture of the posterior LEFT twelfth rib. Additional chronic appearing deformities throughout the LEFT ribs, although some additional deformities are of uncertain age. Chronic appearing compression fracture deformity, severe, of the T12 vertebral body. Additional chronic compression fracture deformity of the T8 vertebral body status post kyphoplasty. No acute appearing fracture identified within the thoracic spine. IMPRESSION: 1. Acute appearing fractures of the posterolateral LEFT ninth and tenth ribs. Additional slightly displaced fracture of the posterior LEFT twelfth rib is also likely acute. Additional chronic appearing deformities throughout the LEFT ribs, although some of these additional rib deformities are of uncertain age. 2. Small bilateral pleural effusions. 3. Small consolidations at the lung bases posteriorly, LEFT slightly greater than RIGHT, most likely atelectasis, less likely aspiration or pneumonia. 4. Multiple  pulmonary nodules, as detailed above, largest measuring 12 mm within the LEFT lower lobe, some of which are favored to be scarring or atelectasis. Consider follow-up chest CT at some point to ensure  stability or resolution. 5. Cirrhotic appearing liver. Moderate amount of ascites within the upper abdomen, incompletely imaged. 6. Additional chronic/incidental findings detailed above. Aortic Atherosclerosis (ICD10-I70.0). Electronically Signed   By: Franki Cabot M.D.   On: 05/23/2018 14:40    Pending Labs Unresulted Labs (From admission, onward)    Start     Ordered   05/30/18 0500  Creatinine, serum  (enoxaparin (LOVENOX)    CrCl >/= 30 ml/min)  Weekly,   STAT    Comments:  while on enoxaparin therapy    05/23/18 1544   05/24/18 1448  Basic metabolic panel  Tomorrow morning,   STAT     05/23/18 1544   05/24/18 0500  CBC  Tomorrow morning,   STAT     05/23/18 1544   05/24/18 0500  Hepatic function panel  Tomorrow morning,   STAT     05/23/18 1601          Vitals/Pain Today's Vitals   05/23/18 1200 05/23/18 1230 05/23/18 1400 05/23/18 1600  BP: 105/63 109/68 90/67 94/78  Pulse: 87 87 88 94  Resp: _0 Temp:      TempSrc:      SpO2: 99% 90% 94% 93%  Weight:      Height:        Isolation Precautions No active isolations  Medications Medications  enoxaparin (LOVENOX) injection 40 mg (has no administration in time range)  0.9 %  sodium chloride infusion (has no administration in time range)  acetaminophen (TYLENOL) tablet 650 mg (has no administration in time range)    Or  acetaminophen (TYLENOL) suppository 650 mg (has no administration in time range)  ondansetron (ZOFRAN) tablet 4 mg (has no administration in time range)    Or  ondansetron (ZOFRAN) injection 4 mg (has no administration in time range)  aspirin EC tablet 81 mg (has no administration in time range)  citalopram (CELEXA) tablet 10 mg (has no administration in time range)  donepezil (ARICEPT) tablet 10  mg (has no administration in time range)  levothyroxine (SYNTHROID, LEVOTHROID) tablet 112 mcg (has no administration in time range)  dicyclomine (BENTYL) tablet 20 mg (has no administration in time range)  pantoprazole (PROTONIX) EC tablet 40 mg (has no administration in time range)  Probiotic CAPS 250 mg (has no administration in time range)  gabapentin (NEURONTIN) capsule 100 mg (has no administration in time range)  multivitamin with minerals tablet 1 tablet (has no administration in time range)  PRESERVISION AREDS 2 CAPS 1 tablet (has no administration in time range)  fluticasone (FLONASE) 50 MCG/ACT nasal spray 2 spray (has no administration in time range)  sodium chloride 0.9 % bolus 500 mL (has no administration in time range)  haloperidol lactate (HALDOL) injection 1 mg (has no administration in time range)  sodium chloride 0.9 % bolus 1,000 mL (0 mLs Intravenous Stopped 05/23/18 1306)    Mobility Waiting on PT consult

## 2018-05-23 NOTE — ED Triage Notes (Addendum)
Per EMS, patient fell at home on Saturday and was found by family in bathroom floor Saturday. Patient does not remember falling. History of dementia. Patient brought to ED today for continued complaints of pain to left ribs. Large purple bruise noted to left side. Hips not tender on palpation. Patient denies any other pain.

## 2018-05-23 NOTE — Progress Notes (Signed)
Patient ID: Tina [name], female   DOB: [birth year], 84 y.o.   MRN: [id number]  ACP note  Patient presents.  Daughter and her husband at the bedside  Diagnosis: Acute kidney injury and dehydration, hypotension, frequent falls, dementia, intermittent nausea vomiting, hypothyroidism, history of postherpetic neuralgia, macular degeneration.  CODE STATUS discussed and patient wishes to be a full code  Plan.  Acute kidney injury dehydration and hypotension.  IV fluid bolus and hydration.  Check orthostatic vital signs.  Frequent falls.  Physical therapy evaluation.  Dementia patient will likely need 24/7 care.  Rib fractures  Time spent on ACP discussion 17 minutes Dr. Loletha Grayer

## 2018-05-23 NOTE — ED Provider Notes (Signed)
[facility] Emergency Department Provider Note  ____________________________________________  Time seen: Approximately 11:51 AM  I have reviewed the triage vital signs and the nursing notes.   HISTORY  Chief Complaint Fall  Level 5 Caveat: Portions of the History and Physical including HPI and review of systems are unable to be completely obtained due to patient being a poor historian due to chronic dementia   HPI Tina [name] is a 84 y.o. female with a history of breast cancer and dementia who fell in her bathroom 2 days ago.  Family found her that day and thought she was okay, but she was complaining of some pain on her left side which has not improved over the past 48 hours.  For this reason they bring her for evaluation today due to her ongoing left-sided chest pain after the fall.  She does have bruising in that area.  Denies any other complaints.      Past Medical History:  Diagnosis Date  . Anxiety   . Cancer ([place])    breast  . Dementia ([place])   . Hypothyroidism   . Shingles      Patient Active Problem List   Diagnosis Date Noted  . Status post kyphoplasty 05/10/2018  . Wrist fracture, bilateral 03/23/2018  . Dementia in Alzheimer's disease ([place]) 05/24/2017  . GI bleed 05/15/2017  . Acute blood loss anemia 05/15/2017  . Acute encephalopathy 05/15/2017  . Tachycardia 05/15/2017  . AMD (age related macular degeneration) 01/03/2015  . Allergic rhinitis 12/12/2013  . Depressive disorder, not elsewhere classified 12/12/2013  . Hypothyroidism 12/12/2013  . Postherpetic neuralgia 12/12/2013     Past Surgical History:  Procedure Laterality Date  . APPENDECTOMY    . BREAST SURGERY    . EYE SURGERY    . KYPHOPLASTY N/A 06/27/2015   Procedure: KYPHOPLASTY L4;  Surgeon: Hessie Knows, MD;  Location: [facility] ORS;  Service: Orthopedics;  Laterality: N/A;  . KYPHOPLASTY N/A 05/10/2018   Procedure: Manfred Arch;  Surgeon: Hessie Knows, MD;   Location: [facility] ORS;  Service: Orthopedics;  Laterality: N/A;  . ORIF WRIST FRACTURE Bilateral 03/24/2018   Procedure: OPEN REDUCTION INTERNAL FIXATION (ORIF) WRIST FRACTURE;  Surgeon: Hessie Knows, MD;  Location: [facility] ORS;  Service: Orthopedics;  Laterality: Bilateral;     Prior to Admission medications   Medication Sig Start Date End Date Taking? Authorizing Provider  acetaminophen (TYLENOL) 325 MG tablet Take 2 tablets (650 mg total) by mouth every 6 (six) hours as needed for mild pain (or Fever >/= 101). 05/18/17  Yes Gouru, Illene Silver, MD  alendronate (FOSAMAX) 70 MG tablet Take 70 mg by mouth every Sunday.  02/12/17  Yes [provider]  aspirin EC 81 MG tablet Take 81 mg by mouth daily.   Yes [provider]  Calcium-Phosphorus-Vitamin D (CALCIUM/D3 ADULT GUMMIES PO) Take 2 tablets by mouth 2 (two) times daily. With lunch & supper   Yes [provider]  citalopram (CELEXA) 10 MG tablet Take 10 mg by mouth daily.   Yes [provider]  dicyclomine (BENTYL) 20 MG tablet Take 20 mg by mouth 4 (four) times daily.  03/09/18  Yes [provider]  donepezil (ARICEPT) 10 MG tablet Take 10 mg by mouth at bedtime.    Yes [provider]  fluticasone (FLONASE) 50 MCG/ACT nasal spray Place 2 sprays into both nostrils at bedtime.    Yes [provider]  gabapentin (NEURONTIN) 300 MG capsule Take 600 mg 3 (three) times daily  by mouth.    Yes [provider]  levothyroxine (SYNTHROID, LEVOTHROID) 112 MCG tablet Take 112 mcg by mouth daily before breakfast.    Yes [provider]  meloxicam (MOBIC) 7.5 MG tablet Take 7.5 mg by mouth.   Yes [provider]  Multiple Vitamins-Minerals (PRESERVISION AREDS 2 PO) Take 1 tablet by mouth 2 (two) times daily.   Yes [provider]  pantoprazole (PROTONIX) 40 MG tablet Take 1 tablet (40 mg total) by mouth 2 (two) times daily. 06/23/17  Yes Vonda Antigua B, MD   Potassium (POTASSIMIN PO) Take 1 tablet by mouth at bedtime.   Yes [provider]  Saccharomyces boulardii (PROBIOTIC) 250 MG CAPS Take 250 mg by mouth 2 (two) times daily.   Yes [provider]  Multiple Vitamin (MULTIVITAMIN WITH MINERALS) TABS tablet Take 1 tablet by mouth daily with lunch. Alive or Museum/gallery curator, Historical, MD     Allergies Nsaids and Meloxicam   Family History  Problem Relation Age of Onset  . CAD Mother   . CAD Father     Social History Social History   Tobacco Use  . Smoking status: Current Every Day Smoker    Packs/day: 0.50  . Smokeless tobacco: Never Used  Substance Use Topics  . Alcohol use: No  . Drug use: No    Review of Systems  Constitutional:   No fever.  ENT:   No sore throat. No rhinorrhea. Cardiovascular:   No chest pain or syncope. Respiratory:   No dyspnea or cough. Gastrointestinal:   Negative for abdominal pain, vomiting and diarrhea.  Musculoskeletal:   Left chest wall pain as above All other systems reviewed and are negative except as documented above in ROS and HPI.  ____________________________________________   PHYSICAL EXAM:  VITAL SIGNS: ED Triage Vitals  Enc Vitals Group     BP 05/23/18 1111 97/63     Pulse Rate 05/23/18 1111 90     Resp 05/23/18 1111 15     Temp 05/23/18 1111 97.6 F (36.4 C)     Temp Source 05/23/18 1111 Oral     SpO2 05/23/18 1111 95 %     Weight 05/23/18 1112 145 lb 1 oz (65.8 kg)     Height 05/23/18 1112 5' (1.524 m)     Head Circumference --      Peak Flow --      Pain Score --      Pain Loc --      Pain Edu? --      Excl. in [place]? --     Vital signs reviewed, nursing assessments reviewed.   Constitutional:   Alert and oriented. Non-toxic appearance. Eyes:   Conjunctivae are normal. EOMI. PERRL. ENT      Head:   Normocephalic and atraumatic.      Nose:   No congestion/rhinnorhea.       Mouth/Throat:   Dry mucous membranes, no pharyngeal  erythema. No peritonsillar mass.       Neck:   No meningismus. Full ROM.  No crepitus, trachea midline Hematological/Lymphatic/Immunilogical:   No cervical lymphadenopathy. Cardiovascular:   RRR. Symmetric bilateral radial and DP pulses.  No murmurs. Cap refill less than 2 seconds. Respiratory:   Normal respiratory effort without tachypnea/retractions. Breath sounds are clear and equal bilaterally. No wheezes/rales/rhonchi. Gastrointestinal:   Soft and nontender. Non distended. There is no CVA tenderness.  No rebound, rigidity, or guarding.  Musculoskeletal:   Normal range of  motion in all extremities. No joint effusions.  No lower extremity tenderness.  No edema.  Left chest wall stable but with a large bruised area on the lateral aspect, diffusely tender over the left chest anterior lateral and posterior.  No crepitus. Neurologic:   Normal speech and language.  Motor grossly intact. No acute focal neurologic deficits are appreciated.  Skin:    Skin is warm, dry and intact. No rash noted.  No petechiae, purpura, or bullae.  ____________________________________________    LABS (pertinent positives/negatives) (all labs ordered are listed, but only abnormal results are displayed) Labs Reviewed  BASIC METABOLIC PANEL - Abnormal; Notable for the following components:      Result Value   Glucose, Bld 113 (*)    Creatinine, Ser 1.27 (*)    Calcium 8.1 (*)    GFR calc non Af Amer 37 (*)    GFR calc Af Amer 42 (*)    All other components within normal limits  CBC WITH DIFFERENTIAL/PLATELET - Abnormal; Notable for the following components:   WBC 10.6 (*)    RDW 16.2 (*)    All other components within normal limits  URINALYSIS, COMPLETE (UACMP) WITH MICROSCOPIC - Abnormal; Notable for the following components:   Color, Urine YELLOW (*)    APPearance CLEAR (*)    Bacteria, UA RARE (*)    All other components within normal limits    ____________________________________________   EKG  Interpreted by me Sinus rhythm rate of 92, normal axis and intervals.  Normal QRS ST segments and T waves.  ____________________________________________    RADIOLOGY  Dg Chest 2 View  Result Date: 05/23/2018 CLINICAL DATA:  Left-sided rib cage pain following an unwitnessed fall 2 days ago. History of dementia. EXAM: CHEST - 2 VIEW COMPARISON:  Chest x-ray of May 15, 2017 FINDINGS: The lungs are adequately inflated. There is mild chronic elevation of the left hemidiaphragm. There is no focal infiltrate. There is a small pleural effusion layering posteriorly on the left. There is no pneumothorax. The heart and pulmonary vascularity are normal. There is tortuosity of the ascending and descending thoracic aorta with mural calcification. The observed left ribs exhibit no acute abnormalities. There is chronic compression of T8 and T12. T8 has been treated with kyphoplasty. IMPRESSION: Chronic mild elevation of the left hemidiaphragm. New small left pleural effusion layering posteriorly. No pneumothorax nor definite pulmonary contusion. If there are strong clinical concerns of an occult left rib fracture, a dedicated left rib series would be useful. Thoracic aortic atherosclerosis. Electronically Signed   By: David  Martinique M.D.   On: 05/23/2018 12:03   Ct Chest Wo Contrast  Result Date: 05/23/2018 CLINICAL DATA:  Fall on Saturday, found down. History of dementia. Continued complaints of pain to LEFT ribs. Bruising to LEFT side. EXAM: CT CHEST WITHOUT CONTRAST TECHNIQUE: Multidetector CT imaging of the chest was performed following the standard protocol without IV contrast. COMPARISON:  Chest x-ray dated 05/23/2018. FINDINGS: Cardiovascular: Heart size is normal. No pericardial effusion. Diffuse coronary artery calcifications. Aortic atherosclerosis. No thoracic aortic aneurysm. Mediastinum/Nodes: No hemorrhage or edema within the mediastinum.  No mass or enlarged lymph nodes seen within the mediastinum or perihilar regions. Esophagus is unremarkable, possible small hiatal hernia. Lungs/Pleura: Small consolidations at the lung bases posteriorly, LEFT slightly greater than RIGHT, most likely atelectasis. Small bilateral pleural effusions. 7 mm irregular pulmonary nodule, perifissural, at upper margin of the RIGHT middle lobe (series 3, image 73). 12 mm irregular nodular density within the posteromedial  LEFT lower lobe (series 3, image 71). 6 mm nodule within the posterior portion of the RIGHT upper lobe (series 3, image 34), suspected scarring or atelectasis. 7 mm irregular nodular density within the posterior portion of the RIGHT upper lobe (series 3, image 29), suspected scarring or atelectasis. 6 mm irregular nodular density within the RIGHT lung apex (series 3, image 22), suspected scarring or atelectasis. Upper Abdomen: Cirrhotic appearing liver. Moderate amount of ascites within the upper abdomen, incompletely imaged. Bilateral renal cysts. Diverticulosis at the splenic flexure. Musculoskeletal: Acute appearing fractures of the posterolateral LEFT ninth and tenth ribs. Additional probably acute fracture of the posterior LEFT twelfth rib. Additional chronic appearing deformities throughout the LEFT ribs, although some additional deformities are of uncertain age. Chronic appearing compression fracture deformity, severe, of the T12 vertebral body. Additional chronic compression fracture deformity of the T8 vertebral body status post kyphoplasty. No acute appearing fracture identified within the thoracic spine. IMPRESSION: 1. Acute appearing fractures of the posterolateral LEFT ninth and tenth ribs. Additional slightly displaced fracture of the posterior LEFT twelfth rib is also likely acute. Additional chronic appearing deformities throughout the LEFT ribs, although some of these additional rib deformities are of uncertain age. 2. Small bilateral pleural  effusions. 3. Small consolidations at the lung bases posteriorly, LEFT slightly greater than RIGHT, most likely atelectasis, less likely aspiration or pneumonia. 4. Multiple pulmonary nodules, as detailed above, largest measuring 12 mm within the LEFT lower lobe, some of which are favored to be scarring or atelectasis. Consider follow-up chest CT at some point to ensure stability or resolution. 5. Cirrhotic appearing liver. Moderate amount of ascites within the upper abdomen, incompletely imaged. 6. Additional chronic/incidental findings detailed above. Aortic Atherosclerosis (ICD10-I70.0). Electronically Signed   By: Franki Cabot M.D.   On: 05/23/2018 14:40    ____________________________________________   PROCEDURES Procedures  ____________________________________________  DIFFERENTIAL DIAGNOSIS   Chest contusion, rib fracture, hemothorax, dehydration, urinary tract infection  CLINICAL IMPRESSION / ASSESSMENT AND PLAN / ED COURSE  Pertinent labs & imaging results that were available during my care of the patient were reviewed by me and considered in my medical decision making (see chart for details).    Patient is nontoxic, vital signs unremarkable, brought to the ED after a fall.  Exam reveals blunt trauma to the left chest.  I will obtain an x-ray to evaluate.  She also appears significantly dehydrated and I will give IV fluids and check labs.  Clinical Course as of May 23 1521  [facility] May 23, 2018  1345 Chest x-ray does not show any fractures, but does show a pleural effusion.  Will obtain a CT chest to evaluate for occult fractures or possible hemothorax.   [PS]    Clinical Course User Index [PS] Carrie Mew, MD    ----------------------------------------- 3:21 PM on 05/23/2018 -----------------------------------------  Had a long discussion with patient and family.  Family notes that the patient has had multiple falls recently.  She saw her primary care doctor 10 days  ago with similar circumstances.  She has home health aide, nursing, PT/OT but apparently has not been able to obtain a safe environment at home.  Family has concerns about her safety if she continues to live alone.  Discussed with hospitalist for evaluation of multiple rib fractures and AKI.  Will also consult social worker for possible placement options given the somewhat limited benefit of hospitalization.Marland Kitchen    ----------------------------------------- 3:23 PM on 05/23/2018 -----------------------------------------  Family also reports a concern that she  has had some head injuries although I see no signs of trauma currently.  CT had obtained which by my review does not show any acute hemorrhage.  We will follow-up radiology report.  Signed out to Dr. Clearnce Hasten.  ____________________________________________   FINAL CLINICAL IMPRESSION(S) / ED DIAGNOSES    Final diagnoses:  Alzheimer's dementia without behavioral disturbance, unspecified timing of dementia onset ([place])  Closed fracture of multiple ribs of left side, initial encounter  AKI (acute kidney injury) [facility])     ED Discharge Orders    None      Portions of this note were generated with dragon dictation software. Dictation errors may occur despite best attempts at proofreading.    Carrie Mew, MD 05/23/18 [phone]

## 2018-05-23 NOTE — H&P (Addendum)
[facility] [place] NAME: Tina [name]    MR#:  [id number]  DATE OF BIRTH:  05/21/1928  DATE OF ADMISSION:  05/23/2018  PRIMARY CARE PHYSICIAN: Derinda Late, MD   REQUESTING/REFERRING PHYSICIAN: Dr Carrie Mew  CHIEF COMPLAINT:   Chief Complaint  Patient presents with  . Fall    HISTORY OF PRESENT ILLNESS:  Tina [name]  is a 84 y.o. female with a known history of dementia presents again after another fall.  She is recently had a fall and had bilateral wrist repaired.  She was discharged from rehab and had another fall and then had a kyphoplasty.  She has been falling a lot at home.  Patient answers no to most questions.  Daughter states that she has been confused.  She feels dizzy a lot.  Her blood pressures always been on the lower side.  She is lost 20 pounds since being discharged from rehab.  She has had diarrhea but that is now under control.  She has been having trouble starting her urination.  Daughter also states that she has been having episodes of nausea vomiting and poor appetite and not eating very well.  In the ER, she was found to have 3 rib fractures and acute kidney injury and hospitalist services were contacted for further evaluation  PAST MEDICAL HISTORY:   Past Medical History:  Diagnosis Date  . Anxiety   . Cancer ([place])    breast  . Dementia ([place])   . Hypothyroidism   . Shingles     PAST SURGICAL HISTORY:   Past Surgical History:  Procedure Laterality Date  . APPENDECTOMY    . BREAST SURGERY    . EYE SURGERY    . KYPHOPLASTY N/A 06/27/2015   Procedure: KYPHOPLASTY L4;  Surgeon: Hessie Knows, MD;  Location: [facility] ORS;  Service: Orthopedics;  Laterality: N/A;  . KYPHOPLASTY N/A 05/10/2018   Procedure: Manfred Arch;  Surgeon: Hessie Knows, MD;  Location: [facility] ORS;  Service: Orthopedics;  Laterality: N/A;  . ORIF WRIST FRACTURE Bilateral 03/24/2018   Procedure: OPEN REDUCTION INTERNAL FIXATION  (ORIF) WRIST FRACTURE;  Surgeon: Hessie Knows, MD;  Location: [facility] ORS;  Service: Orthopedics;  Laterality: Bilateral;    SOCIAL HISTORY:   Social History   Tobacco Use  . Smoking status: Current Every Day Smoker    Packs/day: 0.50  . Smokeless tobacco: Never Used  Substance Use Topics  . Alcohol use: No    FAMILY HISTORY:   Family History  Problem Relation Age of Onset  . CAD Mother   . CAD Father     DRUG ALLERGIES:   Allergies  Allergen Reactions  . Nsaids Other (See Comments)    H/O GI BLEED  . Meloxicam     Other reaction(s): Other (See Comments) Upper GI Bleeding    REVIEW OF SYSTEMS:  CONSTITUTIONAL: No fever, fatigue or weakness.  EYES: No blurred or double vision.  EARS, NOSE, AND THROAT: No tinnitus or ear pain. No sore throat RESPIRATORY: No cough, shortness of breath, wheezing or hemoptysis.  CARDIOVASCULAR: No chest pain, orthopnea, edema.  GASTROINTESTINAL: No nausea, vomiting, diarrhea or abdominal pain. No blood in bowel movements GENITOURINARY: No dysuria, hematuria.  ENDOCRINE: No polyuria, nocturia,  HEMATOLOGY: No anemia, easy bruising or bleeding SKIN: No rash or lesion. MUSCULOSKELETAL: No joint pain or arthritis.   NEUROLOGIC: No tingling, numbness, weakness.  PSYCHIATRY: No anxiety or depression.   MEDICATIONS AT HOME:   Prior to Admission medications  Medication Sig Start Date End Date Taking? Authorizing Provider  acetaminophen (TYLENOL) 325 MG tablet Take 2 tablets (650 mg total) by mouth every 6 (six) hours as needed for mild pain (or Fever >/= 101). 05/18/17  Yes Gouru, Illene Silver, MD  alendronate (FOSAMAX) 70 MG tablet Take 70 mg by mouth every Sunday.  02/12/17  Yes [provider]  aspirin EC 81 MG tablet Take 81 mg by mouth daily.   Yes [provider]  Calcium-Phosphorus-Vitamin D (CALCIUM/D3 ADULT GUMMIES PO) Take 2 tablets by mouth 2 (two) times daily. With lunch & supper   Yes [provider]   citalopram (CELEXA) 10 MG tablet Take 10 mg by mouth daily.   Yes [provider]  dicyclomine (BENTYL) 20 MG tablet Take 20 mg by mouth 4 (four) times daily.  03/09/18  Yes [provider]  donepezil (ARICEPT) 10 MG tablet Take 10 mg by mouth at bedtime.    Yes [provider]  fluticasone (FLONASE) 50 MCG/ACT nasal spray Place 2 sprays into both nostrils at bedtime.    Yes [provider]  gabapentin (NEURONTIN) 300 MG capsule Take 600 mg 3 (three) times daily by mouth.    Yes [provider]  levothyroxine (SYNTHROID, LEVOTHROID) 112 MCG tablet Take 112 mcg by mouth daily before breakfast.    Yes [provider]  meloxicam (MOBIC) 7.5 MG tablet Take 7.5 mg by mouth.   Yes [provider]  Multiple Vitamins-Minerals (PRESERVISION AREDS 2 PO) Take 1 tablet by mouth 2 (two) times daily.   Yes [provider]  pantoprazole (PROTONIX) 40 MG tablet Take 1 tablet (40 mg total) by mouth 2 (two) times daily. 06/23/17  Yes Vonda Antigua B, MD  Potassium (POTASSIMIN PO) Take 1 tablet by mouth at bedtime.   Yes [provider]  Saccharomyces boulardii (PROBIOTIC) 250 MG CAPS Take 250 mg by mouth 2 (two) times daily.   Yes [provider]  Multiple Vitamin (MULTIVITAMIN WITH MINERALS) TABS tablet Take 1 tablet by mouth daily with lunch. Alive or Museum/gallery curator, Historical, MD      VITAL SIGNS:  Blood pressure 90/67, pulse 88, temperature 97.6 F (36.4 C), temperature source Oral, resp. rate 14, height 5' (1.524 m), weight 65.8 kg, SpO2 94 %.  PHYSICAL EXAMINATION:  GENERAL:  84 y.o.-year-old patient lying in the bed with no acute distress.  EYES: Pupils equal, round, reactive to light and accommodation. No scleral icterus. Extraocular muscles intact.  HEENT: Head atraumatic, normocephalic. Oropharynx and nasopharynx clear.  NECK:  Supple, no jugular venous distention. No thyroid enlargement, no  tenderness.  LUNGS: Normal breath sounds bilaterally, no wheezing, rales,rhonchi or crepitation. No use of accessory muscles of respiration.  CARDIOVASCULAR: S1, S2 normal. No murmurs, rubs, or gallops.  ABDOMEN: Soft, nontender, nondistended. Bowel sounds present. No organomegaly or mass.  EXTREMITIES: No pedal edema, cyanosis, or clubbing.  NEUROLOGIC: Cranial nerves II through XII are intact. Muscle strength 5/5 in all extremities. Sensation intact. Gait not checked.  PSYCHIATRIC: The patient is alert and no to all questions because she does not want to be in the hospital..  SKIN: No rash, lesion, or ulcer.   LABORATORY PANEL:   CBC Recent Labs  Lab 05/23/18 1135  WBC 10.6*  HGB 12.3  HCT 37.4  PLT 266   ------------------------------------------------------------------------------------------------------------------  Chemistries  Recent Labs  Lab 05/23/18 1135  NA 136  K 3.6  CL 101  CO2 27  GLUCOSE 113*  BUN 18  CREATININE 1.27*  CALCIUM 8.1*   ------------------------------------------------------------------------------------------------------------------    RADIOLOGY:  Dg Chest 2 View  Result Date: 05/23/2018 CLINICAL DATA:  Left-sided rib cage pain following an unwitnessed fall 2 days ago. History of dementia. EXAM: CHEST - 2 VIEW COMPARISON:  Chest x-ray of May 15, 2017 FINDINGS: The lungs are adequately inflated. There is mild chronic elevation of the left hemidiaphragm. There is no focal infiltrate. There is a small pleural effusion layering posteriorly on the left. There is no pneumothorax. The heart and pulmonary vascularity are normal. There is tortuosity of the ascending and descending thoracic aorta with mural calcification. The observed left ribs exhibit no acute abnormalities. There is chronic compression of T8 and T12. T8 has been treated with kyphoplasty. IMPRESSION: Chronic mild elevation of the left hemidiaphragm. New small left pleural effusion  layering posteriorly. No pneumothorax nor definite pulmonary contusion. If there are strong clinical concerns of an occult left rib fracture, a dedicated left rib series would be useful. Thoracic aortic atherosclerosis. Electronically Signed   By: David  Martinique M.D.   On: 05/23/2018 12:03   Ct Head Wo Contrast  Result Date: 05/23/2018 CLINICAL DATA:  Recent fall EXAM: CT HEAD WITHOUT CONTRAST TECHNIQUE: Contiguous axial images were obtained from the base of the skull through the vertex without intravenous contrast. COMPARISON:  05/15/2017 FINDINGS: Brain: Diffuse atrophic changes and chronic white matter ischemic changes are seen. New bilateral subdural hygromas are noted. No acute hemorrhage is seen. No findings to suggest acute infarct are noted. Vascular: No hyperdense vessel or unexpected calcification. Skull: Normal. Negative for fracture or focal lesion. Sinuses/Orbits: No acute finding. Other: None. IMPRESSION: Chronic atrophic and ischemic changes with bilateral subdural hygromas. No acute hemorrhage is seen. Electronically Signed   By: Inez Catalina M.D.   On: 05/23/2018 15:23   Ct Chest Wo Contrast  Result Date: 05/23/2018 CLINICAL DATA:  Fall on Saturday, found down. History of dementia. Continued complaints of pain to LEFT ribs. Bruising to LEFT side. EXAM: CT CHEST WITHOUT CONTRAST TECHNIQUE: Multidetector CT imaging of the chest was performed following the standard protocol without IV contrast. COMPARISON:  Chest x-ray dated 05/23/2018. FINDINGS: Cardiovascular: Heart size is normal. No pericardial effusion. Diffuse coronary artery calcifications. Aortic atherosclerosis. No thoracic aortic aneurysm. Mediastinum/Nodes: No hemorrhage or edema within the mediastinum. No mass or enlarged lymph nodes seen within the mediastinum or perihilar regions. Esophagus is unremarkable, possible small hiatal hernia. Lungs/Pleura: Small consolidations at the lung bases posteriorly, LEFT slightly greater than  RIGHT, most likely atelectasis. Small bilateral pleural effusions. 7 mm irregular pulmonary nodule, perifissural, at upper margin of the RIGHT middle lobe (series 3, image 73). 12 mm irregular nodular density within the posteromedial LEFT lower lobe (series 3, image 71). 6 mm nodule within the posterior portion of the RIGHT upper lobe (series 3, image 34), suspected scarring or atelectasis. 7 mm irregular nodular density within the posterior portion of the RIGHT upper lobe (series 3, image 29), suspected scarring or atelectasis. 6 mm irregular nodular density within the RIGHT lung apex (series 3, image 22), suspected scarring or atelectasis. Upper Abdomen: Cirrhotic appearing liver. Moderate amount of ascites within the upper abdomen, incompletely imaged. Bilateral renal cysts. Diverticulosis at the splenic flexure. Musculoskeletal: Acute appearing fractures of the posterolateral LEFT ninth and tenth ribs. Additional probably acute fracture of the posterior LEFT twelfth rib. Additional chronic appearing deformities throughout the LEFT ribs, although some additional deformities are of uncertain age. Chronic appearing compression fracture deformity, severe,  of the T12 vertebral body. Additional chronic compression fracture deformity of the T8 vertebral body status post kyphoplasty. No acute appearing fracture identified within the thoracic spine. IMPRESSION: 1. Acute appearing fractures of the posterolateral LEFT ninth and tenth ribs. Additional slightly displaced fracture of the posterior LEFT twelfth rib is also likely acute. Additional chronic appearing deformities throughout the LEFT ribs, although some of these additional rib deformities are of uncertain age. 2. Small bilateral pleural effusions. 3. Small consolidations at the lung bases posteriorly, LEFT slightly greater than RIGHT, most likely atelectasis, less likely aspiration or pneumonia. 4. Multiple pulmonary nodules, as detailed above, largest measuring  12 mm within the LEFT lower lobe, some of which are favored to be scarring or atelectasis. Consider follow-up chest CT at some point to ensure stability or resolution. 5. Cirrhotic appearing liver. Moderate amount of ascites within the upper abdomen, incompletely imaged. 6. Additional chronic/incidental findings detailed above. Aortic Atherosclerosis (ICD10-I70.0). Electronically Signed   By: Franki Cabot M.D.   On: 05/23/2018 14:40    EKG:   Sinus rhythm 92 bpm  IMPRESSION AND PLAN:   1.  Acute kidney injury and dehydration.  Patient also has hypotension.  We will give a fluid bolus and IV fluid hydration.  Recheck kidney function tomorrow.  Check orthostatic vital signs. 2.  Frequent falls, 3 rib fractures.  Physical therapy evaluation.  The patient does live alone.  Explained that we can do rehab but that is only a short period of time.  Then it is up to family to help take care of the patient and would have to pay for home caregivers to be in there 24/7 with her.  They can also consider long-term care.  We will get social worker consult. 3.  Dementia on Aricept 4.  Intermittent nausea vomiting already on PPI.  Hold off on meloxicam.  Get a right upper quadrant sonogram to rule out gallbladder disease 5.  Hypothyroidism unspecified on levothyroxine 6.  History of postherpetic neuralgia on gabapentin.  I will have to decrease the dose of gabapentin 100 mg 3 times daily 7.  Macular degeneration on PreserVision  All the records are reviewed and case discussed with ED provider. Management plans discussed with the patient, family and they are in agreement.  CODE STATUS: Full Code  TOTAL TIME TAKING CARE OF THIS PATIENT: 50 minutes, including acp time.    Loletha Grayer M.D on 05/23/2018 at 3:48 PM  Between 7am to 6pm - Pager - [phone]  After 6pm call admission pager [phone]  [facility] Office  [phone]  CC: Primary care physician; Derinda Late, MD

## 2018-05-23 NOTE — ED Notes (Signed)
Floor unable to take report at this time. Will call back.  

## 2018-05-23 NOTE — Progress Notes (Signed)
Anticoagulation monitoring(Lovenox):  84 yo female ordered Lovenox 40 mg Q24h  Filed Weights   05/23/18 1112  Weight: 145 lb 1 oz (65.8 kg)   BMI   Lab Results  Component Value Date   CREATININE 1.27 (H) 05/23/2018   CREATININE 0.76 03/24/2018   CREATININE 0.75 03/23/2018   Estimated Creatinine Clearance: 25.9 mL/min (A) (by C-G formula based on SCr of 1.27 mg/dL (H)). Hemoglobin & Hematocrit     Component Value Date/Time   HGB 12.3 05/23/2018 1135   HCT 37.4 05/23/2018 1135     Per Protocol for Patient with estCrcl < 30 ml/min and BMI < 40, will transition to Lovenox 30 mg Q24h.

## 2018-05-24 ENCOUNTER — Inpatient Hospital Stay: Payer: Medicare Other

## 2018-05-24 DIAGNOSIS — L8990 Pressure ulcer of unspecified site, unspecified stage: Secondary | ICD-10-CM

## 2018-05-24 DIAGNOSIS — K8020 Calculus of gallbladder without cholecystitis without obstruction: Secondary | ICD-10-CM

## 2018-05-24 LAB — BASIC METABOLIC PANEL
Anion gap: 9 (ref 5–15)
BUN: 15 mg/dL (ref 8–23)
CHLORIDE: 107 mmol/L (ref 98–111)
CO2: 22 mmol/L (ref 22–32)
Calcium: 7.4 mg/dL — ABNORMAL LOW (ref 8.9–10.3)
Creatinine, Ser: 0.97 mg/dL (ref 0.44–1.00)
GFR calc Af Amer: 59 mL/min — ABNORMAL LOW (ref >60)
GFR calc non Af Amer: 51 mL/min — ABNORMAL LOW (ref >60)
Glucose, Bld: 96 mg/dL (ref 70–99)
Potassium: 3.8 mmol/L (ref 3.5–5.1)
SODIUM: 138 mmol/L (ref 135–145)

## 2018-05-24 LAB — HEPATIC FUNCTION PANEL
ALT: 20 U/L (ref 0–44)
AST: 41 U/L (ref 15–41)
Albumin: 2.1 g/dL — ABNORMAL LOW (ref 3.5–5.0)
Alkaline Phosphatase: 281 U/L — ABNORMAL HIGH (ref 38–126)
BILIRUBIN TOTAL: 1.1 mg/dL (ref 0.3–1.2)
Bilirubin, Direct: 0.5 mg/dL — ABNORMAL HIGH (ref 0.0–0.2)
Indirect Bilirubin: 0.6 mg/dL (ref 0.3–0.9)
Total Protein: 5 g/dL — ABNORMAL LOW (ref 6.5–8.1)

## 2018-05-24 LAB — CBC
HEMATOCRIT: 31 % — AB (ref 36.0–46.0)
Hemoglobin: 10.2 g/dL — ABNORMAL LOW (ref 12.0–15.0)
MCH: 28.5 pg (ref 26.0–34.0)
MCHC: 32.9 g/dL (ref 30.0–36.0)
MCV: 86.6 fL (ref 80.0–100.0)
NRBC: 0 % (ref 0.0–0.2)
PLATELETS: 252 10*3/uL (ref 150–400)
RBC: 3.58 MIL/uL — AB (ref 3.87–5.11)
RDW: 16.1 % — ABNORMAL HIGH (ref 11.5–15.5)
WBC: 10.4 10*3/uL (ref 4.0–10.5)

## 2018-05-24 MED ORDER — ENSURE ENLIVE PO LIQD
237.0000 mL | Freq: Two times a day (BID) | ORAL | Status: DC
  Administered 2018-05-24 – 2018-05-27 (×6): 237 mL via ORAL

## 2018-05-24 NOTE — NC FL2 (Signed)
[place] LEVEL OF CARE SCREENING TOOL     IDENTIFICATION  Patient Name: Tina [name] Birthdate: 02-20-1928 Sex: female Admission Date (Current Location): 05/23/2018  [place] and [place] Number:  Engineering geologist and Address:  [facility], [address], [place],  [address]      Provider Number: [id number]  Attending Physician Name and Address:  Saundra Shelling, MD  Relative Name and Phone Number:  Jhania Etherington [phone]    Current Level of Care: Hospital Recommended Level of Care: [place] Prior Approval Number:    Date Approved/Denied:   PASRR Number: [id number]  Discharge Plan: SNF    Current Diagnoses: Patient Active Problem List   Diagnosis Date Noted  . Pressure injury of skin 05/24/2018  . Acute kidney injury ([place]) 05/23/2018  . Status post kyphoplasty 05/10/2018  . Wrist fracture, bilateral 03/23/2018  . Dementia in Alzheimer's disease ([place]) 05/24/2017  . GI bleed 05/15/2017  . Acute blood loss anemia 05/15/2017  . Acute encephalopathy 05/15/2017  . Tachycardia 05/15/2017  . AMD (age related macular degeneration) 01/03/2015  . Allergic rhinitis 12/12/2013  . Depressive disorder, not elsewhere classified 12/12/2013  . Hypothyroidism 12/12/2013  . Postherpetic neuralgia 12/12/2013    Orientation RESPIRATION BLADDER Height & Weight     Self, Time, Place  Normal Incontinent Weight: 145 lb 1 oz (65.8 kg) Height:  5' (152.4 cm)  BEHAVIORAL SYMPTOMS/MOOD NEUROLOGICAL BOWEL NUTRITION STATUS  (none) (none) Incontinent Diet(Regular )  AMBULATORY STATUS COMMUNICATION OF NEEDS Skin   Extensive Assist Verbally Other (Comment)(Stage 1 left buttock )                       Personal Care Assistance Level of Assistance  Bathing, Feeding, Dressing Bathing Assistance: Limited assistance Feeding assistance: Independent Dressing Assistance: Limited assistance     Functional Limitations  Info  Sight, Hearing, Speech Sight Info: Adequate Hearing Info: Adequate Speech Info: Adequate    SPECIAL CARE FACTORS FREQUENCY  PT (By licensed PT), OT (By licensed OT)     PT Frequency: 5 OT Frequency: 5            Contractures Contractures Info: Not present    Additional Factors Info  Code Status, Allergies Code Status Info: Full Code  Allergies Info: Nsaids, Lactose Intolerance (Gi), Meloxicam           Current Medications (05/24/2018):  This is the current hospital active medication list Current Facility-Administered Medications  Medication Dose Route Frequency Provider Last Rate Last Dose  . 0.9 %  sodium chloride infusion   Intravenous Continuous Loletha Grayer, MD 50 mL/hr at 05/23/18 2140    . acetaminophen (TYLENOL) tablet 650 mg  650 mg Oral Q6H PRN Loletha Grayer, MD   650 mg at 05/23/18 2046   Or  . acetaminophen (TYLENOL) suppository 650 mg  650 mg Rectal Q6H PRN Loletha Grayer, MD      . acidophilus (RISAQUAD) capsule 1 capsule  1 capsule Oral BID Loletha Grayer, MD   1 capsule at 05/24/18 1054  . aspirin EC tablet 81 mg  81 mg Oral Daily Loletha Grayer, MD   81 mg at 05/24/18 1052  . citalopram (CELEXA) tablet 10 mg  10 mg Oral Daily Loletha Grayer, MD   10 mg at 05/24/18 1054  . dicyclomine (BENTYL) tablet 20 mg  20 mg Oral QID Loletha Grayer, MD   20 mg at 05/24/18 1353  . donepezil (ARICEPT) tablet  10 mg  10 mg Oral QHS Wieting, Richard, MD      . enoxaparin (LOVENOX) injection 30 mg  30 mg Subcutaneous Q24H Loletha Grayer, MD   30 mg at 05/23/18 2051  . feeding supplement (ENSURE ENLIVE) (ENSURE ENLIVE) liquid 237 mL  237 mL Oral BID BM Pyreddy, Pavan, MD   237 mL at 05/24/18 1359  . fluticasone (FLONASE) 50 MCG/ACT nasal spray 2 spray  2 spray Each Nare QHS PRN Wieting, Richard, MD      . gabapentin (NEURONTIN) capsule 100 mg  100 mg Oral TID Loletha Grayer, MD   100 mg at 05/24/18 1053  . haloperidol lactate (HALDOL) injection 1 mg   1 mg Intravenous Q6H PRN Loletha Grayer, MD   1 mg at 05/24/18 0217  . levothyroxine (SYNTHROID, LEVOTHROID) tablet 112 mcg  112 mcg Oral Q0600 Loletha Grayer, MD   112 mcg at 05/24/18 0650  . multivitamin with minerals tablet 1 tablet  1 tablet Oral Q lunch Loletha Grayer, MD   1 tablet at 05/24/18 1104  . multivitamin-lutein (OCUVITE-LUTEIN) capsule 1 capsule  1 capsule Oral BID Loletha Grayer, MD   1 capsule at 05/24/18 1055  . ondansetron (ZOFRAN) tablet 4 mg  4 mg Oral Q6H PRN Wieting, Richard, MD       Or  . ondansetron (ZOFRAN) injection 4 mg  4 mg Intravenous Q6H PRN Wieting, Richard, MD      . pantoprazole (PROTONIX) EC tablet 40 mg  40 mg Oral BID Loletha Grayer, MD   40 mg at 05/24/18 1053     Discharge Medications: Please see discharge summary for a list of discharge medications.  Relevant Imaging Results:  Relevant Lab Results:   Additional Information SSN: [id number]  Annamaria Boots, [place]

## 2018-05-24 NOTE — Consult Note (Signed)
Patient ID: Tina [name], female   DOB: [birth year], 84 y.o.   MRN: [id number]  HPI Tina [name] is a 84 y.o. female d/w Dr. Estanislado Pandy.  Reasonable symptomatic cholelithiasis.  The patient is 55 with a history of Alzheimer's with significant deterioration on her overall health for the last couple months.  A lengthy conversation with daughter about her baseline status.  Right now she lives in her own house but requires maximal assistance.  She walks only for a few steps with a walker.  She has had multiple incontinent episodes both of urine and feces.  She reports that she has significant delirium at night and after her last previous wrist fracture surgery things are deteriorating.  She also notes that her mother complain of some early satiety some nausea and some vomiting.  Recently thinks down and got dehydrated.  Most recent hospitalization was due to recurrent falls and acute kidney injury.  Apparently she has complained of some upper abdominal pain and an ultrasound was performed that I have personally reviewed showing no evidence of cholecystitis and only cholelithiasis.  Normal common bile duct. More  Importantly there is some changes consistent with cirrhosis and ascites. Also personally reviewed her CT of the chest showing left ninth through 12th rib fractures.  Also evidence of cirrhosis of the liver with ascites Bilirubin is 2.1 with an elevation of the alkaline phosphatase to 281. CBC shows a hemoglobin of 10 and platelet count is normal. Evaluated by GI Dr. 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  HPI  Past Medical History:  Diagnosis Date  . Anxiety   . Cancer ([place])    breast  . Dementia ([place])   . Hypothyroidism   . Shingles     Past Surgical History:  Procedure Laterality Date  . APPENDECTOMY    . BREAST SURGERY    . EYE SURGERY    . KYPHOPLASTY N/A 06/27/2015   Procedure: KYPHOPLASTY L4;  Surgeon: Hessie Knows, MD;  Location: [facility] ORS;   Service: Orthopedics;  Laterality: N/A;  . KYPHOPLASTY N/A 05/10/2018   Procedure: [place];  Surgeon: Hessie Knows, MD;  Location: [facility] ORS;  Service: Orthopedics;  Laterality: N/A;  . ORIF WRIST FRACTURE Bilateral 03/24/2018   Procedure: OPEN REDUCTION INTERNAL FIXATION (ORIF) WRIST FRACTURE;  Surgeon: Hessie Knows, MD;  Location: [facility] ORS;  Service: Orthopedics;  Laterality: Bilateral;    Family History  Problem Relation Age of Onset  . CAD Mother   . CAD Father     Social History Social History   Tobacco Use  . Smoking status: Current Every Day Smoker    Packs/day: 0.50  . Smokeless tobacco: Never Used  Substance Use Topics  . Alcohol use: No  . Drug use: No    Allergies  Allergen Reactions  . Nsaids Other (See Comments)    H/O GI BLEED  . Lactose Intolerance (Gi)   . Meloxicam     Other reaction(s): Other (See Comments) Upper GI Bleeding    Current Facility-Administered Medications  Medication Dose Route Frequency Provider Last Rate Last Dose  . 0.9 %  sodium chloride infusion   Intravenous Continuous Loletha Grayer, MD 50 mL/hr at 05/23/18 2140    . acetaminophen (TYLENOL) tablet 650 mg  650 mg Oral Q6H PRN Loletha Grayer, MD   650 mg at 05/23/18 2046   Or  . acetaminophen (TYLENOL) suppository 650 mg  650 mg Rectal Q6H PRN Loletha Grayer, MD      .  acidophilus (RISAQUAD) capsule 1 capsule  1 capsule Oral BID Loletha Grayer, MD   1 capsule at 05/24/18 1054  . aspirin EC tablet 81 mg  81 mg Oral Daily Loletha Grayer, MD   81 mg at 05/24/18 1052  . citalopram (CELEXA) tablet 10 mg  10 mg Oral Daily Loletha Grayer, MD   10 mg at 05/24/18 1054  . dicyclomine (BENTYL) tablet 20 mg  20 mg Oral QID Loletha Grayer, MD   20 mg at 05/24/18 1654  . donepezil (ARICEPT) tablet 10 mg  10 mg Oral QHS Wieting, Richard, MD      . enoxaparin (LOVENOX) injection 30 mg  30 mg Subcutaneous Q24H Loletha Grayer, MD   30 mg at 05/23/18 2051  . feeding supplement  (ENSURE ENLIVE) (ENSURE ENLIVE) liquid 237 mL  237 mL Oral BID BM Pyreddy, Pavan, MD   237 mL at 05/24/18 1359  . fluticasone (FLONASE) 50 MCG/ACT nasal spray 2 spray  2 spray Each Nare QHS PRN Wieting, Richard, MD      . gabapentin (NEURONTIN) capsule 100 mg  100 mg Oral TID Loletha Grayer, MD   100 mg at 05/24/18 1655  . haloperidol lactate (HALDOL) injection 1 mg  1 mg Intravenous Q6H PRN Loletha Grayer, MD   1 mg at 05/24/18 0217  . levothyroxine (SYNTHROID, LEVOTHROID) tablet 112 mcg  112 mcg Oral Q0600 Loletha Grayer, MD   112 mcg at 05/24/18 0650  . multivitamin with minerals tablet 1 tablet  1 tablet Oral Q lunch Loletha Grayer, MD   1 tablet at 05/24/18 1104  . multivitamin-lutein (OCUVITE-LUTEIN) capsule 1 capsule  1 capsule Oral BID Loletha Grayer, MD   1 capsule at 05/24/18 1055  . ondansetron (ZOFRAN) tablet 4 mg  4 mg Oral Q6H PRN Loletha Grayer, MD       Or  . ondansetron (ZOFRAN) injection 4 mg  4 mg Intravenous Q6H PRN Wieting, Richard, MD      . pantoprazole (PROTONIX) EC tablet 40 mg  40 mg Oral BID Loletha Grayer, MD   40 mg at 05/24/18 1053     Review of Systems Full ROS  was asked and was negative except for the information on the HPI  Physical Exam Blood pressure (!) 118/59, pulse 95, temperature 97.9 F (36.6 C), temperature source Oral, resp. rate 20, height 5' (1.524 m), weight 65.8 kg, SpO2 95 %. CONSTITUTIONAL: Debilitated female in NAD EYES: Pupils are equal, round, and reactive to light, Sclera are non-icteric. EARS, NOSE, MOUTH AND THROAT: The oropharynx is clear. The oral mucosa is pink and moist. Hearing is intact to voice. LYMPH NODES:  Lymph nodes in the neck are normal. RESPIRATORY:  Lungs are clear. There is normal respiratory effort, with equal breath sounds bilaterally, and without pathologic use of accessory muscles. CARDIOVASCULAR: Heart is regular without murmurs, gallops, or rubs. LEFT sided chest tenderness, no hematoma or PTX GI:  The abdomen is  soft, nontender, and nondistended. There are no palpable masses. There is no hepatosplenomegaly. There are normal bowel sounds in all quadrants. GU: Rectal deferred.   MUSCULOSKELETAL: Normal muscle strength and tone. No cyanosis or edema.   SKIN: Turgor is good and there are no pathologic skin lesions or ulcers. NEUROLOGIC: Motor and sensation is grossly normal. Cranial nerves are grossly intact. PSYCH: Disoriented but pleasantly confused.. Affect is normal.  Data Reviewed  I have personally reviewed the patient's imaging, laboratory findings and medical records.    Assessment/Plan 84 year old with dementia and  progressive worsening of her medical condition.  Now with incidental cholelithiasis which I do not think are the cause of her problems.  More importantly she does have cirrhosis and ascites with a very low albumin.  She did have apparently evidence of primary biliary cholangitis and was seen by Dr. Bonna Gains at some point in time.  From a surgical perspective there is no indication for any further intervention regarding her gallbladder.  Currently I do think that her symptoms are more likely related to rib fractures and soft tissue injuries from multiple falls.  I had a lengthy discussion with the daughter who is the power of attorney about end-of-life issues.  She is interested in talking with palliative care since her mom condition has deteriorated significantly over the last few months. She is currently a poor surgical candidate and I will not recommend any surgical intervention this will be fraught with potential life-threatening complications. Extensive counseling provided to the family and to the daughter. If Medical team is interesting in pursuing any further work-up from her cirrhosis I will recommend consultation with GI/hematology I will be available if any surgical needs will arise.    Caroleen Hamman, MD FACS General Surgeon 05/24/2018, 5:01 PM

## 2018-05-24 NOTE — Clinical Social Work Note (Signed)
Clinical Social Work Assessment  Patient Details  Name: Tina [name] MRN: [id number] Date of Birth: 1927/08/30  Date of referral:  05/24/18               Reason for consult:  Facility Placement                Permission sought to share information with:  Case Manager, Customer service manager, Family Supports Permission granted to share information::  Yes, Verbal Permission Granted  Name::      SNF  Agency::   [facility]  Relationship::     Contact Information:     Housing/Transportation Living arrangements for the past 2 months:  [place] of Information:  Patient, Adult Children Patient Interpreter Needed:  None Criminal Activity/Legal Involvement Pertinent to Current Situation/Hospitalization:  No - Comment as needed Significant Relationships:  Adult Children Lives with:  Self Do you feel safe going back to the place where you live?  Yes Need for family participation in patient care:  Yes (Comment)  Care giving concerns:  Patient lives alone in [place] Worker assessment / plan:  CSW consulted for facility placement. CSW met with patient and daughter Tina [name] at bedside.  CSW introduced self and explained role. CSW also explained PT recommendation of SNF. Patient states that she does not want to go to a facility and wants to go home. Patient's daughter states that patient will have to go to a facility for safety reasons. CSW explained that it is patient decision and she can not be forced to go to facility. Daughter states that she will discuss with her brother who is POA. CSW provided SNF list for patient and daughter. Daughter asked that bed search be done and that patient's son be contacted with bed offers. CSW will initiate bed search and give bed offers once received. CSW will follow for discharge planning.   Employment status:  Retired Forensic scientist:  [employer] PT Recommendations:  [place] /  Referral to community resources:  [place]  Patient/Family's Response to care:  Patient and daughter thanked CSW for assistance   Patient/Family's Understanding of and Emotional Response to Diagnosis, Current Treatment, and Prognosis:  Daughter aware of current diagnoses and treatment.   Emotional Assessment Appearance:  Appears stated age Attitude/Demeanor/Rapport:  Guarded Affect (typically observed):  Agitated Orientation:  Oriented to Self, Oriented to Place, Oriented to  Time Alcohol / Substance use:  Not Applicable Psych involvement (Current and /or in the community):  No (Comment)  Discharge Needs  Concerns to be addressed:  Discharge Planning Concerns Readmission within the last 30 days:  No Current discharge risk:  Dependent with Mobility, Lives alone Barriers to Discharge:  Continued Medical Work up   [employer], [place] 05/24/2018, 2:48 PM

## 2018-05-24 NOTE — Care Management (Signed)
Presents from home after falling at home.  Tina [name] does not remember falling.  Fracture rib 9 thru 12 on the left.  Physical therapy has recommended skilled nursing and Tina [name] has stated Tina [name] wants to go home.  Tina [name] lives at home alone and this is of concern for family. Recent kyphoplasty. PT had recommended home health. Order for home health PT but do not find documentation that a referral was made. Reaching out to agencies but will have to be followed up 11/27.  It is after business hours.

## 2018-05-24 NOTE — Progress Notes (Addendum)
[facility] [place] NAME: Tina [name]    MR#:  [id number]  DATE OF BIRTH:  May 01, 1928  SUBJECTIVE:  CHIEF COMPLAINT:   Chief Complaint  Patient presents with  . Fall  Patient seen and evaluated today Has abdominal pain No nausea and vomiting No complaints of chest pain or shortness of breath  REVIEW OF SYSTEMS:    ROS  CONSTITUTIONAL: No documented fever. No fatigue, weakness. No weight gain, no weight loss.  EYES: No blurry or double vision.  ENT: No tinnitus. No postnasal drip. No redness of the oropharynx.  RESPIRATORY: No cough, no wheeze, no hemoptysis. No dyspnea.  CARDIOVASCULAR: No chest pain. No orthopnea. No palpitations. No syncope.  GASTROINTESTINAL: No nausea, no vomiting or diarrhea.  Has abdominal pain. No melena or hematochezia.  GENITOURINARY: No dysuria or hematuria.  ENDOCRINE: No polyuria or nocturia. No heat or cold intolerance.  HEMATOLOGY: No anemia. No bruising. No bleeding.  INTEGUMENTARY: No rashes. No lesions.  MUSCULOSKELETAL: No arthritis. No swelling. No gout.  NEUROLOGIC: No numbness, tingling, or ataxia. No seizure-type activity.  PSYCHIATRIC: No anxiety. No insomnia. No ADD.   DRUG ALLERGIES:   Allergies  Allergen Reactions  . Nsaids Other (See Comments)    H/O GI BLEED  . Lactose Intolerance (Gi)   . Meloxicam     Other reaction(s): Other (See Comments) Upper GI Bleeding    VITALS:  Blood pressure (!) 118/59, pulse 95, temperature 97.9 F (36.6 C), temperature source Oral, resp. rate 20, height 5' (1.524 m), weight 65.8 kg, SpO2 95 %.  PHYSICAL EXAMINATION:   Physical Exam  GENERAL:  84 y.o.-year-old patient lying in the bed with no acute distress.  EYES: Pupils equal, round, reactive to light and accommodation. No scleral icterus. Extraocular muscles intact.  HEENT: Head atraumatic, normocephalic. Oropharynx and nasopharynx clear.  NECK:  Supple, no jugular venous distention. No  thyroid enlargement, no tenderness.  LUNGS: Normal breath sounds bilaterally, no wheezing, rales, rhonchi. No use of accessory muscles of respiration.  CARDIOVASCULAR: S1, S2 normal. No murmurs, rubs, or gallops.  ABDOMEN: Soft, tenderness right upper quadrant area, nondistended. Bowel sounds present. No organomegaly or mass.  EXTREMITIES: No cyanosis, clubbing or edema b/l.    NEUROLOGIC: Cranial nerves II through XII are intact. No focal Motor or sensory deficits b/l.   PSYCHIATRIC: The patient is alert and oriented x 3.  SKIN: No obvious rash, lesion, or ulcer.   LABORATORY PANEL:   CBC Recent Labs  Lab 05/24/18 0401  WBC 10.4  HGB 10.2*  HCT 31.0*  PLT 252   ------------------------------------------------------------------------------------------------------------------ Chemistries  Recent Labs  Lab 05/24/18 0401  NA 138  K 3.8  CL 107  CO2 22  GLUCOSE 96  BUN 15  CREATININE 0.97  CALCIUM 7.4*  AST 41  ALT 20  ALKPHOS 281*  BILITOT 1.1   ------------------------------------------------------------------------------------------------------------------  Cardiac Enzymes No results for input(s): TROPONINI in the last 168 hours. ------------------------------------------------------------------------------------------------------------------  RADIOLOGY:  Dg Chest 2 View  Result Date: 05/23/2018 CLINICAL DATA:  Left-sided rib cage pain following an unwitnessed fall 2 days ago. History of dementia. EXAM: CHEST - 2 VIEW COMPARISON:  Chest x-ray of May 15, 2017 FINDINGS: The lungs are adequately inflated. There is mild chronic elevation of the left hemidiaphragm. There is no focal infiltrate. There is a small pleural effusion layering posteriorly on the left. There is no pneumothorax. The heart and pulmonary vascularity are normal. There is tortuosity of the ascending  and descending thoracic aorta with mural calcification. The observed left ribs exhibit no acute  abnormalities. There is chronic compression of T8 and T12. T8 has been treated with kyphoplasty. IMPRESSION: Chronic mild elevation of the left hemidiaphragm. New small left pleural effusion layering posteriorly. No pneumothorax nor definite pulmonary contusion. If there are strong clinical concerns of an occult left rib fracture, a dedicated left rib series would be useful. Thoracic aortic atherosclerosis. Electronically Signed   By: David  Martinique M.D.   On: 05/23/2018 12:03   Ct Head Wo Contrast  Result Date: 05/23/2018 CLINICAL DATA:  Recent fall EXAM: CT HEAD WITHOUT CONTRAST TECHNIQUE: Contiguous axial images were obtained from the base of the skull through the vertex without intravenous contrast. COMPARISON:  05/15/2017 FINDINGS: Brain: Diffuse atrophic changes and chronic white matter ischemic changes are seen. New bilateral subdural hygromas are noted. No acute hemorrhage is seen. No findings to suggest acute infarct are noted. Vascular: No hyperdense vessel or unexpected calcification. Skull: Normal. Negative for fracture or focal lesion. Sinuses/Orbits: No acute finding. Other: None. IMPRESSION: Chronic atrophic and ischemic changes with bilateral subdural hygromas. No acute hemorrhage is seen. Electronically Signed   By: Inez Catalina M.D.   On: 05/23/2018 15:23   Ct Chest Wo Contrast  Result Date: 05/23/2018 CLINICAL DATA:  Fall on Saturday, found down. History of dementia. Continued complaints of pain to LEFT ribs. Bruising to LEFT side. EXAM: CT CHEST WITHOUT CONTRAST TECHNIQUE: Multidetector CT imaging of the chest was performed following the standard protocol without IV contrast. COMPARISON:  Chest x-ray dated 05/23/2018. FINDINGS: Cardiovascular: Heart size is normal. No pericardial effusion. Diffuse coronary artery calcifications. Aortic atherosclerosis. No thoracic aortic aneurysm. Mediastinum/Nodes: No hemorrhage or edema within the mediastinum. No mass or enlarged lymph nodes seen  within the mediastinum or perihilar regions. Esophagus is unremarkable, possible small hiatal hernia. Lungs/Pleura: Small consolidations at the lung bases posteriorly, LEFT slightly greater than RIGHT, most likely atelectasis. Small bilateral pleural effusions. 7 mm irregular pulmonary nodule, perifissural, at upper margin of the RIGHT middle lobe (series 3, image 73). 12 mm irregular nodular density within the posteromedial LEFT lower lobe (series 3, image 71). 6 mm nodule within the posterior portion of the RIGHT upper lobe (series 3, image 34), suspected scarring or atelectasis. 7 mm irregular nodular density within the posterior portion of the RIGHT upper lobe (series 3, image 29), suspected scarring or atelectasis. 6 mm irregular nodular density within the RIGHT lung apex (series 3, image 22), suspected scarring or atelectasis. Upper Abdomen: Cirrhotic appearing liver. Moderate amount of ascites within the upper abdomen, incompletely imaged. Bilateral renal cysts. Diverticulosis at the splenic flexure. Musculoskeletal: Acute appearing fractures of the posterolateral LEFT ninth and tenth ribs. Additional probably acute fracture of the posterior LEFT twelfth rib. Additional chronic appearing deformities throughout the LEFT ribs, although some additional deformities are of uncertain age. Chronic appearing compression fracture deformity, severe, of the T12 vertebral body. Additional chronic compression fracture deformity of the T8 vertebral body status post kyphoplasty. No acute appearing fracture identified within the thoracic spine. IMPRESSION: 1. Acute appearing fractures of the posterolateral LEFT ninth and tenth ribs. Additional slightly displaced fracture of the posterior LEFT twelfth rib is also likely acute. Additional chronic appearing deformities throughout the LEFT ribs, although some of these additional rib deformities are of uncertain age. 2. Small bilateral pleural effusions. 3. Small consolidations  at the lung bases posteriorly, LEFT slightly greater than RIGHT, most likely atelectasis, less likely aspiration or pneumonia. 4. Multiple  pulmonary nodules, as detailed above, largest measuring 12 mm within the LEFT lower lobe, some of which are favored to be scarring or atelectasis. Consider follow-up chest CT at some point to ensure stability or resolution. 5. Cirrhotic appearing liver. Moderate amount of ascites within the upper abdomen, incompletely imaged. 6. Additional chronic/incidental findings detailed above. Aortic Atherosclerosis (ICD10-I70.0). Electronically Signed   By: Franki Cabot M.D.   On: 05/23/2018 14:40   US Abdomen Limited Ruq  Result Date: 05/24/2018 CLINICAL DATA:  Nausea and vomiting and history of cholelithiasis EXAM: ULTRASOUND ABDOMEN LIMITED RIGHT UPPER QUADRANT COMPARISON:  07/23/2017 FINDINGS: Gallbladder: Gallbladder is well distended. No wall thickening or pericholecystic fluid is noted. Small gallstones are again seen and stable. Common bile duct: Diameter: 6.2 mm. This is within normal limits for the patient's age. Liver: Nodularity is noted consistent with underlying cirrhosis. No focal mass lesion is seen. Portal vein is patent on color Doppler imaging with normal direction of blood flow towards the liver. Note is made of mild ascites. IMPRESSION: Changes consistent with hepatic cirrhosis with mild ascites. Cholelithiasis without complicating factors. Electronically Signed   By: Inez Catalina M.D.   On: 05/24/2018 08:54     ASSESSMENT AND PLAN:  84 year old female patient with history of dementia, breast cancer, hypothyroidism has frequent falls.  -Acute kidney injury improving IV fluid hydration Follow-up renal function  -Rib fractures secondary to fall Pain management  -Gait instability and ambulatory dysfunction Physical therapy evaluation Assess home situation  -Cholelithiasis and abdominal pain Surgery consultation  -Dementia Continue Aricept and  supportive care  -Nausea and vomiting Antiemetics  -Postherpetic neuralgia Continue gabapentin  -DVT prophylaxis subcu Lovenox daily   All the records are reviewed and case discussed with Care Management/Social Worker. Management plans discussed with the patient, family and they are in agreement.  CODE STATUS: full code  DVT Prophylaxis: SCDs  TOTAL TIME TAKING CARE OF THIS PATIENT: 35 minutes.   POSSIBLE D/C IN 2 to 3 DAYS, DEPENDING ON CLINICAL CONDITION.  Saundra Shelling M.D on 05/24/2018 at 2:58 PM  Between 7am to 6pm - Pager - [phone]  After 6pm go to [email or web address] - password [email or web address] [facility]  Office  [phone]  CC: Primary care physician; Derinda Late, MD  Note: This dictation was prepared with Dragon dictation along with smaller phrase technology. Any transcriptional errors that result from this process are unintentional.

## 2018-05-24 NOTE — Progress Notes (Addendum)
Initial Nutrition Assessment  DOCUMENTATION CODES:   Not applicable  INTERVENTION:   Ensure Enlive po BID, each supplement provides 350 kcal and 20 grams of protein  MVI daily  Ocuvite daily for wound healing (provides zinc, vitamin A, vitamin C, Vitamin E, copper, and selenium)  Pt likely at refeed risk; recommend monitor K, Mg and P labs when oral intake improves  NUTRITION DIAGNOSIS:   Inadequate oral intake related to acute illness as evidenced by meal completion < 25%.  GOAL:   Patient will meet greater than or equal to 90% of their needs  MONITOR:   PO intake, Supplement acceptance, Labs, Weight trends, Skin, I & O's  REASON FOR ASSESSMENT:   Malnutrition Screening Tool    ASSESSMENT:   84 y/o female with Acute kidney injury and dehydration, hypotension, frequent falls, dementia, intermittent nausea vomiting, hypothyroidism, history of postherpetic neuralgia, macular degeneration.   Met with pt in room today. Pt is a poor historian but reports poor appetite and oral intake pta. Pt eating only sips and bites of meals; pt's lunch tray was sitting on her side table barely touched. Per H & P pt with poor appetite, nausea and vomiting pta. Family also reports pt with a 20lb weight loss since being discharged for rehab. Per chart, pt weighed 155lbs in 09/2017, 155lbs in 02/2018 and 138lbs on 05/09/2018. Pt's admit weight was 145lbs. It appears pt has lost 10-15lbs(8-11%) over the past 2 months which is significant. Pt has dentures; reports she is able to chew her foods fine. Pt does report liking strawberry Ensure and would like to have these while in hospital. RD will order supplements and vitamins to help pt meet her estimated needs. Pt likely at refeed risk; recommend monitor K, Mg and P labs when oral intake improves. Of note, pt is lactose intolerant per family.    Medications reviewed and include: risaquad, aspirin, celexa, lovenox, synthroid, MVI, ocuvite, protonix, NaCl  '@50ml' /hr  Labs reviewed:    NUTRITION - FOCUSED PHYSICAL EXAM:    Most Recent Value  Orbital Region  No depletion  Upper Arm Region  No depletion  Thoracic and Lumbar Region  No depletion  Buccal Region  No depletion  Temple Region  Severe depletion  Clavicle Bone Region  Severe depletion  Clavicle and Acromion Bone Region  Severe depletion  Scapular Bone Region  Moderate depletion  Dorsal Hand  Severe depletion  Patellar Region  Mild depletion  Anterior Thigh Region  Mild depletion  Posterior Calf Region  Mild depletion  Edema (RD Assessment)  None  Hair  Reviewed  Eyes  Reviewed  Mouth  Reviewed  Skin  Reviewed  Nails  Reviewed     Diet Order:   Diet Order            Diet regular Room service appropriate? Yes; Fluid consistency: Thin  Diet effective now             EDUCATION NEEDS:   Education needs have been addressed  Skin:  Skin Assessment: Reviewed RN Assessment(Stage I buttocks, incision back)  Last BM:  11/26- type 4  Height:   Ht Readings from Last 1 Encounters:  05/23/18 5' (1.524 m)    Weight:   Wt Readings from Last 1 Encounters:  05/23/18 65.8 kg    Ideal Body Weight:  45.4 kg  BMI:  Body mass index is 28.33 kg/m.  Estimated Nutritional Needs:   Kcal:  1300-1500kcal/day   Protein:  65-75g/day   Fluid:  1.1L/day   Koleen Distance MS, RD, LDN Pager #- [phone] Office#- [phone] After Hours Pager: [phone]

## 2018-05-24 NOTE — Evaluation (Signed)
Physical Therapy Evaluation Patient Details Name: Tina [name] MRN: [id number] DOB: 1928/01/11 Today's Date: 05/24/2018   History of Present Illness  84 y/o female with fall with R rib fx. She has had numerous falls recently.  In September she suffered B wrist fractures with R radial surgical repair and ulnar fracture nondisplaced; L radial surgically repaired impacted fracture. Since that time she has had a recent kyphoplasty ~2 weeks ago.   PMHx:  anxiety, kyphoplasty, shingles, dementia, macular degeneration, PVD  Clinical Impression  Pt with acute R rib pain from fall, has had multiple falls (per pt report, nearly daily per previous notes) and still remains hesitant to use walker or any AD regularly.  Pt was able to do some limited mobility in room with light assist and then ~40 ft of ambulation (mostly with walker.)  During ambulation she did not have any overt LOBs, but was forward stooped, had very short, shuffling, cautious cadence, poor speed/safety awareness and generally displayed why she has been having regular falls.  Unsure how close to baseline this performance was, but it appears that she will likely require more consistent supervision to remain safe in her current home setting.  PT is recommending STR though pt adamant that she wants to go home despite PT discussing frequency of falls and the likelihood of further and more severe injuries and subsequent limitations.  Regardless of ultimate discharge disposition it seems apparent that she needs more consistent supervision because of falls and safety awareness.    Follow Up Recommendations SNF;Supervision - Intermittent(pt states she is not going to rehab, "I want to go home.")    Equipment Recommendations       Recommendations for Other Services       Precautions / Restrictions Precautions Precautions: Fall Restrictions Weight Bearing Restrictions: No      Mobility  Bed Mobility Overal bed mobility: Modified  Independent             General bed mobility comments: Pt needed assist to get to sitting at EOB, she was able to do much of the effort, but rib pain and general weakness required she have some assist to get to seated  Transfers Overall transfer level: Needs assistance Equipment used: None;Rolling walker (2 wheeled) Transfers: Sit to/from Stand Sit to Stand: Min guard         General transfer comment: Pt needed multiple reminders for set up and sequencing, overall did not use walker appropriately even with cuing  Ambulation/Gait Ambulation/Gait assistance: Min assist Gait Distance (Feet): 40 Feet Assistive device: Rolling walker (2 wheeled)       General Gait Details: Pt again with poor safety awareness, keeps walker too far ahead w/o direct assist.  Choppy, shuffling gait with some improvement with cuing.  Pt did manage ~10 ft w/o AD to finish ambulation with some unsteadiness, though no overt LOBs.    Stairs            Wheelchair Mobility    Modified Rankin (Stroke Patients Only)       Balance Overall balance assessment: Needs assistance Sitting-balance support: No upper extremity supported Sitting balance-Leahy Scale: Fair Sitting balance - Comments: Pt able to maintain static sitting at EOB w/o issue, c/o rib pain   Standing balance support: No upper extremity supported Standing balance-Leahy Scale: Poor Standing balance comment: Pt able to maintain balance better with walker, very guarded and hesitant with non-UE assisted standing balanec  Pertinent Vitals/Pain Pain Assessment: 0-10 Pain Score: 8  Pain Location: R ribs    Home Living Family/patient expects to be discharged to:: Private residence Living Arrangements: Alone Available Help at Discharge: Family;Available PRN/intermittently(apparently son can only consistently help on weekends) Type of Home: House Home Access: Stairs to enter   [employer] of Steps: 2 Home Layout: One level Home Equipment: Walker - 2 wheels;Grab bars - tub/shower;Shower seat Additional Comments: gathered from previous notes, pt not consistent with answering questions    Prior Function Level of Independence: Needs assistance   Gait / Transfers Assistance Needed: 4WW in the home           Hand Dominance   Dominant Hand: Right    Extremity/Trunk Assessment   Upper Extremity Assessment Upper Extremity Assessment: Generalized weakness(pain limited especially with R shoulder elevation)    Lower Extremity Assessment Lower Extremity Assessment: Generalized weakness       Communication   Communication: HOH  Cognition Arousal/Alertness: Awake/alert Behavior During Therapy: WFL for tasks assessed/performed Overall Cognitive Status: No family/caregiver present to determine baseline cognitive functioning                                        General Comments      Exercises     Assessment/Plan    PT Assessment Patient needs continued PT services  PT Problem List Decreased strength;Decreased knowledge of use of DME;Decreased activity tolerance;Decreased mobility;Decreased knowledge of precautions;Decreased cognition;Decreased balance;Decreased safety awareness       PT Treatment Interventions DME instruction;Balance training;Gait training;Neuromuscular re-education;Stair training;Functional mobility training;Patient/family education;Therapeutic activities;Therapeutic exercise    PT Goals (Current goals can be found in the Care Plan section)  Acute Rehab PT Goals Patient Stated Goal: Patient would like to go home PT Goal Formulation: With patient Time For Goal Achievement: 06/07/18 Potential to Achieve Goals: Good    Frequency Min 2X/week   Barriers to discharge        Co-evaluation               AM-PAC PT "6 Clicks" Mobility  Outcome Measure Help needed turning from your back to your side while  in a flat bed without using bedrails?: A Little Help needed moving from lying on your back to sitting on the side of a flat bed without using bedrails?: A Little Help needed moving to and from a bed to a chair (including a wheelchair)?: A Little Help needed standing up from a chair using your arms (e.g., wheelchair or bedside chair)?: A Little Help needed to walk in hospital room?: A Little Help needed climbing 3-5 steps with a railing? : A Lot 6 Click Score: 17    End of Session Equipment Utilized During Treatment: Gait belt Activity Tolerance: Patient tolerated treatment well Patient left: with chair alarm set;with call bell/phone within reach Nurse Communication: Mobility status PT Visit Diagnosis: Other abnormalities of gait and mobility (R26.89);Difficulty in walking, not elsewhere classified (R26.2);Muscle weakness (generalized) (M62.81)    Time: 0947-0962 PT Time Calculation (min) (ACUTE ONLY): 34 min   Charges:   PT Evaluation $PT Eval Low Complexity: 1 Low PT Treatments $Gait Training: 8-22 mins        Kreg Shropshire, DPT 05/24/2018, 1:39 PM

## 2018-05-25 DIAGNOSIS — Z515 Encounter for palliative care: Secondary | ICD-10-CM

## 2018-05-25 DIAGNOSIS — F0280 Dementia in other diseases classified elsewhere without behavioral disturbance: Secondary | ICD-10-CM

## 2018-05-25 DIAGNOSIS — K8020 Calculus of gallbladder without cholecystitis without obstruction: Secondary | ICD-10-CM

## 2018-05-25 DIAGNOSIS — N179 Acute kidney failure, unspecified: Secondary | ICD-10-CM

## 2018-05-25 DIAGNOSIS — Z7189 Other specified counseling: Secondary | ICD-10-CM

## 2018-05-25 DIAGNOSIS — R1114 Bilious vomiting: Secondary | ICD-10-CM

## 2018-05-25 DIAGNOSIS — S2242XA Multiple fractures of ribs, left side, initial encounter for closed fracture: Secondary | ICD-10-CM

## 2018-05-25 DIAGNOSIS — Z66 Do not resuscitate: Secondary | ICD-10-CM

## 2018-05-25 DIAGNOSIS — G309 Alzheimer's disease, unspecified: Secondary | ICD-10-CM

## 2018-05-25 LAB — BASIC METABOLIC PANEL
ANION GAP: 6 (ref 5–15)
BUN: 15 mg/dL (ref 8–23)
CO2: 23 mmol/L (ref 22–32)
Calcium: 7.8 mg/dL — ABNORMAL LOW (ref 8.9–10.3)
Chloride: 108 mmol/L (ref 98–111)
Creatinine, Ser: 0.89 mg/dL (ref 0.44–1.00)
GFR calc Af Amer: >60 mL/min (ref >60)
GFR, EST NON AFRICAN AMERICAN: 58 mL/min — AB (ref >60)
Glucose, Bld: 98 mg/dL (ref 70–99)
POTASSIUM: 3.5 mmol/L (ref 3.5–5.1)
SODIUM: 137 mmol/L (ref 135–145)

## 2018-05-25 MED ORDER — ENOXAPARIN SODIUM 40 MG/0.4ML ~~LOC~~ SOLN
40.0000 mg | SUBCUTANEOUS | Status: DC
  Administered 2018-05-25 – 2018-05-26 (×2): 40 mg via SUBCUTANEOUS
  Filled 2018-05-25 (×2): qty 0.4

## 2018-05-25 MED ORDER — SODIUM CHLORIDE 0.9 % IV SOLN
INTRAVENOUS | Status: DC
  Administered 2018-05-25 – 2018-05-26 (×2): via INTRAVENOUS

## 2018-05-25 NOTE — Care Management Important Message (Signed)
Important Message  Patient Details  Name: Tina [name] MRN: [id number] Date of Birth: Nov 10, 1927   Medicare Important Message Given:  Yes    [name] 05/25/2018, 11:47 AM

## 2018-05-25 NOTE — Care Management (Signed)
Patient will be followed by out patient Palliative per Santiago Glad, RN.

## 2018-05-25 NOTE — Plan of Care (Signed)
Tina [name]. , pts POA does not want pt (his mother) to be a DNR. His sister "Marcie Bal' has been made aware. I took bracelet off and will let MD know ,

## 2018-05-25 NOTE — Progress Notes (Addendum)
[facility] NAME: Jonique Kulig    MR#:  [id number]  DATE OF BIRTH:  05-27-1928  SUBJECTIVE:  CHIEF COMPLAINT:   Chief Complaint  Patient presents with  . Fall  Patient seen and evaluated today Awake Lying on the bed No abdominal pain No nausea and vomiting No complaints of chest pain or shortness of breath Family at bedside  REVIEW OF SYSTEMS:    ROS  CONSTITUTIONAL: No documented fever. No fatigue, weakness. No weight gain, no weight loss.  EYES: No blurry or double vision.  ENT: No tinnitus. No postnasal drip. No redness of the oropharynx.  RESPIRATORY: No cough, no wheeze, no hemoptysis. No dyspnea.  CARDIOVASCULAR: No chest pain. No orthopnea. No palpitations. No syncope.  GASTROINTESTINAL: No nausea, no vomiting or diarrhea.  Has decreased abdominal pain. No melena or hematochezia.  GENITOURINARY: No dysuria or hematuria.  ENDOCRINE: No polyuria or nocturia. No heat or cold intolerance.  HEMATOLOGY: No anemia. No bruising. No bleeding.  INTEGUMENTARY: No rashes. No lesions.  MUSCULOSKELETAL: No arthritis. No swelling. No gout.  NEUROLOGIC: No numbness, tingling, or ataxia. No seizure-type activity.  PSYCHIATRIC: No anxiety. No insomnia. No ADD.   DRUG ALLERGIES:   Allergies  Allergen Reactions  . Nsaids Other (See Comments)    H/O GI BLEED  . Lactose Intolerance (Gi)   . Meloxicam     Other reaction(s): Other (See Comments) Upper GI Bleeding    VITALS:  Blood pressure (!) 105/56, pulse (!) 101, temperature 98 F (36.7 C), resp. rate (!) 21, height 5' (1.524 m), weight 65.8 kg, SpO2 95 %.  PHYSICAL EXAMINATION:   Physical Exam  GENERAL:  84 y.o.-year-old patient lying in the bed with no acute distress.  EYES: Pupils equal, round, reactive to light and accommodation. No scleral icterus. Extraocular muscles intact.  HEENT: Head atraumatic, normocephalic. Oropharynx and nasopharynx clear.  NECK:  Supple, no  jugular venous distention. No thyroid enlargement, no tenderness.  LUNGS: Normal breath sounds bilaterally, no wheezing, rales, rhonchi. No use of accessory muscles of respiration.  CARDIOVASCULAR: S1, S2 normal. No murmurs, rubs, or gallops.  ABDOMEN: Soft, nontender, nondistended. Bowel sounds present. No organomegaly or mass.  EXTREMITIES: No cyanosis, clubbing or edema b/l.    NEUROLOGIC: Cranial nerves II through XII are intact. No focal Motor or sensory deficits b/l.   PSYCHIATRIC: The patient is alert and oriented x 2.  SKIN: No obvious rash, lesion, or ulcer.   LABORATORY PANEL:   CBC Recent Labs  Lab 05/24/18 0401  WBC 10.4  HGB 10.2*  HCT 31.0*  PLT 252   ------------------------------------------------------------------------------------------------------------------ Chemistries  Recent Labs  Lab 05/24/18 0401 05/25/18 0327  NA 138 137  K 3.8 3.5  CL 107 108  CO2 22 23  GLUCOSE 96 98  BUN 15 15  CREATININE 0.97 0.89  CALCIUM 7.4* 7.8*  AST 41  --   ALT 20  --   ALKPHOS 281*  --   BILITOT 1.1  --    ------------------------------------------------------------------------------------------------------------------  Cardiac Enzymes No results for input(s): TROPONINI in the last 168 hours. ------------------------------------------------------------------------------------------------------------------  RADIOLOGY:  Dg Chest 2 View  Result Date: 05/23/2018 CLINICAL DATA:  Left-sided rib cage pain following an unwitnessed fall 2 days ago. History of dementia. EXAM: CHEST - 2 VIEW COMPARISON:  Chest x-ray of May 15, 2017 FINDINGS: The lungs are adequately inflated. There is mild chronic elevation of the left hemidiaphragm. There is no focal infiltrate. There is  a small pleural effusion layering posteriorly on the left. There is no pneumothorax. The heart and pulmonary vascularity are normal. There is tortuosity of the ascending and descending thoracic aorta  with mural calcification. The observed left ribs exhibit no acute abnormalities. There is chronic compression of T8 and T12. T8 has been treated with kyphoplasty. IMPRESSION: Chronic mild elevation of the left hemidiaphragm. New small left pleural effusion layering posteriorly. No pneumothorax nor definite pulmonary contusion. If there are strong clinical concerns of an occult left rib fracture, a dedicated left rib series would be useful. Thoracic aortic atherosclerosis. Electronically Signed   By: David  Martinique M.D.   On: 05/23/2018 12:03   Ct Head Wo Contrast  Result Date: 05/23/2018 CLINICAL DATA:  Recent fall EXAM: CT HEAD WITHOUT CONTRAST TECHNIQUE: Contiguous axial images were obtained from the base of the skull through the vertex without intravenous contrast. COMPARISON:  05/15/2017 FINDINGS: Brain: Diffuse atrophic changes and chronic white matter ischemic changes are seen. New bilateral subdural hygromas are noted. No acute hemorrhage is seen. No findings to suggest acute infarct are noted. Vascular: No hyperdense vessel or unexpected calcification. Skull: Normal. Negative for fracture or focal lesion. Sinuses/Orbits: No acute finding. Other: None. IMPRESSION: Chronic atrophic and ischemic changes with bilateral subdural hygromas. No acute hemorrhage is seen. Electronically Signed   By: Inez Catalina M.D.   On: 05/23/2018 15:23   Ct Chest Wo Contrast  Result Date: 05/23/2018 CLINICAL DATA:  Fall on Saturday, found down. History of dementia. Continued complaints of pain to LEFT ribs. Bruising to LEFT side. EXAM: CT CHEST WITHOUT CONTRAST TECHNIQUE: Multidetector CT imaging of the chest was performed following the standard protocol without IV contrast. COMPARISON:  Chest x-ray dated 05/23/2018. FINDINGS: Cardiovascular: Heart size is normal. No pericardial effusion. Diffuse coronary artery calcifications. Aortic atherosclerosis. No thoracic aortic aneurysm. Mediastinum/Nodes: No hemorrhage or edema  within the mediastinum. No mass or enlarged lymph nodes seen within the mediastinum or perihilar regions. Esophagus is unremarkable, possible small hiatal hernia. Lungs/Pleura: Small consolidations at the lung bases posteriorly, LEFT slightly greater than RIGHT, most likely atelectasis. Small bilateral pleural effusions. 7 mm irregular pulmonary nodule, perifissural, at upper margin of the RIGHT middle lobe (series 3, image 73). 12 mm irregular nodular density within the posteromedial LEFT lower lobe (series 3, image 71). 6 mm nodule within the posterior portion of the RIGHT upper lobe (series 3, image 34), suspected scarring or atelectasis. 7 mm irregular nodular density within the posterior portion of the RIGHT upper lobe (series 3, image 29), suspected scarring or atelectasis. 6 mm irregular nodular density within the RIGHT lung apex (series 3, image 22), suspected scarring or atelectasis. Upper Abdomen: Cirrhotic appearing liver. Moderate amount of ascites within the upper abdomen, incompletely imaged. Bilateral renal cysts. Diverticulosis at the splenic flexure. Musculoskeletal: Acute appearing fractures of the posterolateral LEFT ninth and tenth ribs. Additional probably acute fracture of the posterior LEFT twelfth rib. Additional chronic appearing deformities throughout the LEFT ribs, although some additional deformities are of uncertain age. Chronic appearing compression fracture deformity, severe, of the T12 vertebral body. Additional chronic compression fracture deformity of the T8 vertebral body status post kyphoplasty. No acute appearing fracture identified within the thoracic spine. IMPRESSION: 1. Acute appearing fractures of the posterolateral LEFT ninth and tenth ribs. Additional slightly displaced fracture of the posterior LEFT twelfth rib is also likely acute. Additional chronic appearing deformities throughout the LEFT ribs, although some of these additional rib deformities are of uncertain age. 2.  Small  bilateral pleural effusions. 3. Small consolidations at the lung bases posteriorly, LEFT slightly greater than RIGHT, most likely atelectasis, less likely aspiration or pneumonia. 4. Multiple pulmonary nodules, as detailed above, largest measuring 12 mm within the LEFT lower lobe, some of which are favored to be scarring or atelectasis. Consider follow-up chest CT at some point to ensure stability or resolution. 5. Cirrhotic appearing liver. Moderate amount of ascites within the upper abdomen, incompletely imaged. 6. Additional chronic/incidental findings detailed above. Aortic Atherosclerosis (ICD10-I70.0). Electronically Signed   By: Franki Cabot M.D.   On: 05/23/2018 14:40   US Abdomen Limited Ruq  Result Date: 05/24/2018 CLINICAL DATA:  Nausea and vomiting and history of cholelithiasis EXAM: ULTRASOUND ABDOMEN LIMITED RIGHT UPPER QUADRANT COMPARISON:  07/23/2017 FINDINGS: Gallbladder: Gallbladder is well distended. No wall thickening or pericholecystic fluid is noted. Small gallstones are again seen and stable. Common bile duct: Diameter: 6.2 mm. This is within normal limits for the patient's age. Liver: Nodularity is noted consistent with underlying cirrhosis. No focal mass lesion is seen. Portal vein is patent on color Doppler imaging with normal direction of blood flow towards the liver. Note is made of mild ascites. IMPRESSION: Changes consistent with hepatic cirrhosis with mild ascites. Cholelithiasis without complicating factors. Electronically Signed   By: Inez Catalina M.D.   On: 05/24/2018 08:54     ASSESSMENT AND PLAN:  84 year old female patient with history of dementia, breast cancer, hypothyroidism has frequent falls.  -Acute kidney injury improved with IV fluids Discontinue IV fluids and encourage oral intake Follow-up renal function  -Rib fractures secondary to fall Pain management and supportive care  -Gait instability and ambulatory dysfunction History of frequent  falls Physical therapy evaluation Assess home situation  -Cholelithiasis Status post surgery consultation No intervention or surgery recommended at this time Patient is a poor surgical candidate considering her advanced age and multiple comorbidities Surgical consultant deferred surgery  -Dementia Continue Aricept and supportive care  -Nausea and vomiting improved Antiemetics as needed  -Postherpetic neuralgia Continue gabapentin  -DVT prophylaxis subcu Lovenox daily  -Palliative medicine consult Discuss goals of care with the family  -Disposition Home with hospice versus home health services   All the records are reviewed and case discussed with Care Management/Social Worker. Management plans discussed with the patient, family and they are in agreement.  CODE STATUS: full code  DVT Prophylaxis: SCDs  TOTAL TIME TAKING CARE OF THIS PATIENT: 33 minutes.   POSSIBLE D/C IN 2 to 3 DAYS, DEPENDING ON CLINICAL CONDITION.  Saundra Shelling M.D on 05/25/2018 at 10:55 AM  Between 7am to 6pm - Pager - [phone]  After 6pm go to [email or web address] - password [email or web address] [facility]  Office  [phone]  CC: Primary care physician; Derinda Late, MD  Note: This dictation was prepared with Dragon dictation along with smaller phrase technology. Any transcriptional errors that result from this process are unintentional.

## 2018-05-25 NOTE — Progress Notes (Signed)
Physical Therapy Treatment Patient Details Name: Tina [name] MRN: [id number] DOB: 1928/02/13 Today's Date: 05/25/2018    History of Present Illness 84 y/o female with fall with R rib fx. She has had numerous falls recently.  In September she suffered B wrist fractures with R radial surgical repair and ulnar fracture nondisplaced; L radial surgically repaired impacted fracture. Since that time she has had a recent kyphoplasty ~2 weeks ago.   PMHx:  anxiety, kyphoplasty, shingles, dementia, macular degeneration, PVD    PT Comments    Tina [name] was pleasant and agreeable to therapy.  She did require assist for bed mobility this date as well as increased assistance needed for sit>stand due to weakness.  In standing HR up to 130 and pt reporting dizziness, thus pt instructed to return to sitting and then supine where HR improved. SNF remains most appropriate d/c plan at this time.    Follow Up Recommendations  SNF;Supervision/Assistance - 24 hour     Equipment Recommendations  None recommended by PT    Recommendations for Other Services       Precautions / Restrictions Precautions Precautions: Fall Restrictions Weight Bearing Restrictions: No    Mobility  Bed Mobility Overal bed mobility: Needs Assistance Bed Mobility: Supine to Sit;Sit to Supine     Supine to sit: Min assist;HOB elevated Sit to supine: Mod assist   General bed mobility comments: Assist to elevate trunk with use of bed rail and assist from PT.  Mod assist to bring LEs into bed.    Transfers Overall transfer level: Needs assistance Equipment used: Rolling walker (2 wheeled) Transfers: Sit to/from Stand Sit to Stand: Mod assist         General transfer comment: Cues for hand placement and pt very slow to come to standing, requiring mod assist to boost to standing.   Ambulation/Gait             General Gait Details: Deferred as pulse up to 130 with standing activity and pt reports weaknes and  inability to remain standing.    Stairs             Wheelchair Mobility    Modified Rankin (Stroke Patients Only)       Balance Overall balance assessment: Needs assistance Sitting-balance support: No upper extremity supported Sitting balance-Leahy Scale: Fair Sitting balance - Comments: Pt able to maintain static sitting at EOB with UE assist   Standing balance support: No upper extremity supported Standing balance-Leahy Scale: Poor Standing balance comment: Pt relies on at least 1UE support in static standing                            Cognition Arousal/Alertness: Awake/alert Behavior During Therapy: WFL for tasks assessed/performed Overall Cognitive Status: History of cognitive impairments - at baseline                                 General Comments: Pt alert and oriented to self      Exercises Other Exercises Other Exercises: Instructed pt in pursed lip breathing while sitting EOB    General Comments General comments (skin integrity, edema, etc.): Vitals monitored.  Supine BP 110/61, pulse 104.  Sitting BP 108/72, pulse 118.  Standing pulse 130, BP 99/51.  Supine BP 121/68, pulse 114.        Pertinent Vitals/Pain Pain Assessment: Faces Faces Pain Scale: Hurts  even more Pain Location: L flank Pain Descriptors / Indicators: Grimacing;Guarding Pain Intervention(s): Limited activity within patient's tolerance;Monitored during session;Utilized relaxation techniques    Home Living                      Prior Function            PT Goals (current goals can now be found in the care plan section) Acute Rehab PT Goals Patient Stated Goal: none stated PT Goal Formulation: With patient Time For Goal Achievement: 06/07/18 Potential to Achieve Goals: Good Progress towards PT goals: Not progressing toward goals - comment(due to HR and fatigue)    Frequency    Min 2X/week      PT Plan Current plan remains appropriate     Co-evaluation              AM-PAC PT "6 Clicks" Mobility   Outcome Measure  Help needed turning from your back to your side while in a flat bed without using bedrails?: A Little Help needed moving from lying on your back to sitting on the side of a flat bed without using bedrails?: A Little Help needed moving to and from a bed to a chair (including a wheelchair)?: A Lot Help needed standing up from a chair using your arms (e.g., wheelchair or bedside chair)?: A Lot Help needed to walk in hospital room?: A Lot Help needed climbing 3-5 steps with a railing? : A Lot 6 Click Score: 14    End of Session Equipment Utilized During Treatment: Gait belt Activity Tolerance: Treatment limited secondary to medical complications (Comment);Patient limited by fatigue(elevated HR) Patient left: with call bell/phone within reach;in bed;with bed alarm set Nurse Communication: Mobility status;Other (comment)(vitals) PT Visit Diagnosis: Other abnormalities of gait and mobility (R26.89);Difficulty in walking, not elsewhere classified (R26.2);Muscle weakness (generalized) (M62.81)     Time: 4268-3419 PT Time Calculation (min) (ACUTE ONLY): 28 min  Charges:  $Therapeutic Activity: 23-37 mins                    Session was performed by student PT, Belva Crome, and directed, overseen, and documented by this PT.  Collie Siad PT, DPT 05/25/2018, 4:44 PM

## 2018-05-25 NOTE — Progress Notes (Signed)
I explained to the patient and son that her dentures need to be taken out to eat for safety. I educated the reason.  She refuses and it is not enforced by him either to take them out. Pt is tolerating food well nut not liking it. Collier Bullock RN

## 2018-05-25 NOTE — Consult Note (Signed)
Consultation Note Date: 05/25/2018   Patient Name: Tina [name]  DOB: [birth year]  MRN: [id number]  Age / Sex: 84 y.o., female  PCP: Derinda Late, MD Referring Physician: Saundra Shelling, MD  Reason for Consultation: Establishing goals of care  HPI/Patient Profile: 84 y.o. female admitted on 05/23/2018 from home with complaints of a fall.  She has a past medical history of dementia, hypothyroidism, breast cancer, anxiety, postherpetic neuralgia, macular degeneration, hyperlipidemia, psoriasis, and osteoporosis.  Patient was recently discharged from hospital due to multiple falls resulting in bilateral wrist injury requiring surgical intervention and also back injury resulted in kyphoplasty.  Patient was discharged to [facility] after last admission for rehabilitation.  During her ED course daughter reports patient continues to have frequent falls in the home with increased confusion.  Daughter also reported patient complains of feeling dizzy often.  Also reported patient having trouble with urination, decreased appetite with episodes of nausea and vomiting after eating.  Potassium 3.6, glucose 113, creatinine 1.27, WBC 10.6, hemoglobin 12.3, chest x-ray showed mild elevation of the left hemidiaphragm chronic, small left pleural effusion layering posteriorly, no pneumothorax or pulmonary contusion, left rib fracture.  Head CT showed acute fracture of the posterior lateral left ninth and 10th ribs, slightly displaced fracture of the posterior left 12th rib, chronic appearing deformities throughout the left ribs, small bilateral pleural effusion, small consolidations at lung base, cirrhotic appearing liver with moderate amount of ascites within the upper abdomen.  Since admission patient continues to see IV fluids dehydration, pain management and support for rib fractures, and continues on her home medications.  Patient  seen by surgery for cholelithiasis with no recommended surgery or interventions as patient is a poor surgical candidate due to multiple comorbidities.  Patient also evaluated by PT with recommendation for SNF rehab due to gait instability and falls.  Palliative medicine team consulted for goals of care discussion.  Clinical Assessment and Goals of Care: I have reviewed medical records including lab results, imaging, Epic notes, and MAR, received report from the bedside RN, and assessed the patient. I then met at the bedside with patient, and her daughter Tina [name] (husband) to discuss diagnosis prognosis, GOC, EOL wishes, disposition and options.  Patient is alert and oriented x2.  Able to appropriately identify her name and date of birth as well as her family.  Patient was able to answer some questions appropriately during conversation however many of her responses were inconsistent or had no consideration to the current topic.  Patient son was unable to engage in goals of care discussion due to previous scheduled appointments however he verbalized via telephone that he and his sister shared [place] responsibility and were all on the same page with decisions.  I introduced Palliative Medicine as specialized medical care for people living with serious illness. It focuses on providing relief from the symptoms and stress of a serious illness. The goal is to improve quality of life for both the patient and the family.  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 She has one daughter and a son Tina [name] and Tina [name]). Family reports patient lives alone. She enjoys spending time with family and friends. She is [employer]   As far as functional and nutritional status family states they have noticed a decline in patient over the past 9 months with a more severe decline over the past 6-8 weeks. She recently  visited her PCP who informed them her dementia may be progressing. Family expresses patient has fallen a total of 20 times since September 2019.  She expresses patient has been utilizing a walker with standby assistance since she has been discharged home from rehab.  Unortunately family also feels that the walker has provided some stability but not much as she has still continued to fall even with walker. Her daughter lives in [place] and has been here visiting so that someone is with her at all times. Patient fell in the bathroom after family literally turned to walk away. Daughter reports she has tried to get her mother to use a bedside commode but refuses to use or to get one. Patient requires assistance with ADLs. Patient is refusing to eat much and will only take small bites and sips per daughter. She reports patient continues to state she isn't hungry, and at times has some coughing and vomiting with eating. Patient states "I just don't have an appetite and sometimes the food doesn't go down!" Daughter denies any recent SLP evaluations. She endorses patient has loss aproximately 15-20 lbs over the past 6 months. Patient continues to become weaker.   Daughter reports patient mainly stays in bed or chair throughout the day. For several weeks she has been having concerns with sundowning, which daughter states PCP explained as delirium. Patient has hallucinations at night with wandering and expressing seeing people outside when there is noone there.    We discussed her current illness and what it means in the larger context of her on-going co-morbidities.  Natural disease trajectory and expectations at EOL were discussed. I spent a detailed amount of time discussing patient's advancing dementia process in the setting of Alzheimer's, hallucinations, poor po intake, increased weakness, falls, and overall functional decline. Daughter is somewhat tearful and reports they recently saw her PCP due to patient having  increased episodes of sundowning, wandering at night, and some aggressive behaviors. She verbalized her and her brother's awareness of dementia progression. She is appreciative of the explanations of patient's decline and how they all are playing a role in patient's overall condition.   I attempted to elicit values and goals of care important to the patient.    The difference between aggressive medical intervention and comfort care was considered in light of the patient's goals of care. At this time daughter expresses she remains somewhat hopeful that patient will continue to thrive for a little longer with preparation that she may not show signs of improvement. Daughter expresses they would like for her to return home as she did not do well at rehab previously. Family is working to secure 24/7 caregivers because daughter is going to have to return home to [place]. Daughter reports patient currently has home care with [facility] which she wishes to continue with.   Patient has an advance directive which list her daughter Tina [name]) and son Tina [name]) as her POA. I discussed with family patient's current full code status. Daughter expressed patient wishes  for DNR/DNI. Patient also confirms her wishes stating "let me go, don't do nothing but kiss me goodbye!" Patient also does not want any forms of artificial feedings such as PEG tube placement. I presented and discussed with family their goals and possible completion of MOST form. Daughter verbalizes understanding however request to allow brother to review before completing. She expresses she would like for me to follow up with them on Friday and complete and answer any further questions if patient is still hospitalized then. Support given.   Hospice and Palliative Care services outpatient were explained and offered. Family is aware of noticeable decline in patient's condition, but also remains somewhat hopeful given patient was so independent months prior. At this time  they are requesting outpatient palliative support with awareness and wishes if she continues to decline or show no signs of improvement transitioning to hospice. We also discussed residential hospice and their goals and level of care. Daughter verbalizes she feels that patient will need hospice sooner than later but again they must at least provide her the opportunity to return home and see how she does with home health and hired caregivers. Family is aware at any given time they can discuss transitioning patient to hospice with their outpatient palliative team and they will assist.   Questions and concerns were addressed.  Hard Choices booklet left for review. The family was encouraged to call with questions or concerns.  PMT will continue to support holistically.   Primary Decision Makers: HCPOA/CHILDREN: Tina [name] AND Tina [name]    SUMMARY OF RECOMMENDATIONS    DNR/DNI-confirmed by patient/daughter  Discussed with daughter about completing MOST form, family would like to further review document (son) and requesting follow-up on Friday if patient is still admitted to complete document.  Continue to treat the treatable while hospitalized without escalation of care.  Family verbalizes understanding of patient's continuous decline and prognosis given her poor nutritional and functional status.  They expressed their wishes are to allow her an opportunity to return home with home health services and outpatient palliative in order to show signs of improvement.  Family is also aware that patient may not show any signs of improvement and continued to show signs of decline,  that time daughter verbalizes they will be prepared to transition patient to comfort and hospice care.  Family concerned with patient's complaints prior to admission that she felt as if food was getting stuck when eating, noticeable choking episodes at times, with some incidences of vomiting.  Patient received lunch tray while I  was in the room and daughter attempted to see patient spaghetti and mashed potatoes.  Patient was sat upright and was alert.  Patient observed taking several bites without incident but then later noted to began coughing resulting in her vomiting.  Bedside nurse made aware.  Discussed with family and SLP consult has been placed for further evaluation and risk of aspiration.  Family expressed goals is to return home with her current home health PT/OT/RN/aide [facility] with outpatient palliative as an additional resource.  Case management referral for outpatient palliative services at discharge.  Palliative medicine team will continue to support patient, patient's family, and medical team during hospitalization.  Code Status/Advance Care Planning:  DNR/DNI   Symptom Management:   Zofran for nausea  Palliative Prophylaxis:   Aspiration, Bowel Regimen, Delirium Protocol and Frequent Pain Assessment  Additional Recommendations (Limitations, Scope, Preferences):  Full Scope Treatment-continue to treat the treatable without escalation  Psycho-social/Spiritual:   Desire for  further Chaplaincy support:NO   Additional Recommendations: Caregiving  Support/Resources and Education on Hospice  Prognosis:   < 6 months in the setting of poor po intake, deconditioning, dehydration, worsening dementia, cirrhosis with ascites, frequent fall resulting in injuries, hypothyroidism, breast cancer, anxiety, postherpetic neuralgia, macular degeneration, hyperlipidemia, psoriasis, and osteoporosis.  Discharge Planning: Home with Home Health and outpatient palliative      Primary Diagnoses: Present on Admission: . Acute kidney injury ([place])   I have reviewed the medical record, interviewed the patient and family, and examined the patient. The following aspects are pertinent.  Past Medical History:  Diagnosis Date  . Anxiety   . Cancer ([place])    breast  . Dementia ([place])   . Hypothyroidism    . Shingles    Social History   Socioeconomic History  . Marital status: Widowed    Spouse name: Not on file  . Number of children: Not on file  . Years of education: Not on file  . Highest education level: Not on file  Occupational History  . Not on file  Social Needs  . Financial resource strain: Not on file  . Food insecurity:    Worry: Not on file    Inability: Not on file  . Transportation needs:    Medical: Not on file    Non-medical: Not on file  Tobacco Use  . Smoking status: Current Every Day Smoker    Packs/day: 0.50  . Smokeless tobacco: Never Used  Substance and Sexual Activity  . Alcohol use: No  . Drug use: No  . Sexual activity: Not on file  Lifestyle  . Physical activity:    Days per week: Not on file    Minutes per session: Not on file  . Stress: Not on file  Relationships  . Social connections:    Talks on phone: Not on file    Gets together: Not on file    Attends religious service: Not on file    Active member of club or organization: Not on file    Attends meetings of clubs or organizations: Not on file    Relationship status: Not on file  Other Topics Concern  . Not on file  Social History Narrative  . Not on file   Family History  Problem Relation Age of Onset  . CAD Mother   . CAD Father    Scheduled Meds: . acidophilus  1 capsule Oral BID  . aspirin EC  81 mg Oral Daily  . citalopram  10 mg Oral Daily  . dicyclomine  20 mg Oral QID  . donepezil  10 mg Oral QHS  . enoxaparin (LOVENOX) injection  40 mg Subcutaneous Q24H  . feeding supplement (ENSURE ENLIVE)  237 mL Oral BID BM  . gabapentin  100 mg Oral TID  . levothyroxine  112 mcg Oral Q0600  . multivitamin with minerals  1 tablet Oral Q lunch  . multivitamin-lutein  1 capsule Oral BID  . pantoprazole  40 mg Oral BID   Continuous Infusions: PRN Meds:.acetaminophen **OR** acetaminophen, fluticasone, haloperidol lactate, ondansetron **OR** ondansetron (ZOFRAN) IV Medications  Prior to Admission:  Prior to Admission medications   Medication Sig Start Date End Date Taking? Authorizing Provider  acetaminophen (TYLENOL) 325 MG tablet Take 2 tablets (650 mg total) by mouth every 6 (six) hours as needed for mild pain (or Fever >/= 101). 05/18/17  Yes Gouru, Illene Silver, MD  alendronate (FOSAMAX) 70 MG tablet Take 70 mg by mouth every Sunday.  02/12/17  Yes [provider]  aspirin EC 81 MG tablet Take 81 mg by mouth daily.   Yes [provider]  Calcium-Phosphorus-Vitamin D (CALCIUM/D3 ADULT GUMMIES PO) Take 2 tablets by mouth 2 (two) times daily. With lunch & supper   Yes [provider]  citalopram (CELEXA) 10 MG tablet Take 10 mg by mouth daily.   Yes [provider]  dicyclomine (BENTYL) 20 MG tablet Take 20 mg by mouth 4 (four) times daily.  03/09/18  Yes [provider]  donepezil (ARICEPT) 10 MG tablet Take 10 mg by mouth at bedtime.    Yes [provider]  fluticasone (FLONASE) 50 MCG/ACT nasal spray Place 2 sprays into both nostrils at bedtime.    Yes [provider]  gabapentin (NEURONTIN) 300 MG capsule Take 600 mg 3 (three) times daily by mouth.    Yes [provider]  levothyroxine (SYNTHROID, LEVOTHROID) 112 MCG tablet Take 112 mcg by mouth daily before breakfast.    Yes [provider]  meloxicam (MOBIC) 7.5 MG tablet Take 7.5 mg by mouth.   Yes [provider]  Multiple Vitamins-Minerals (PRESERVISION AREDS 2 PO) Take 1 tablet by mouth 2 (two) times daily.   Yes [provider]  pantoprazole (PROTONIX) 40 MG tablet Take 1 tablet (40 mg total) by mouth 2 (two) times daily. 06/23/17  Yes Vonda Antigua B, MD  Potassium (POTASSIMIN PO) Take 1 tablet by mouth at bedtime.   Yes [provider]  Saccharomyces boulardii (PROBIOTIC) 250 MG CAPS Take 250 mg by mouth 2 (two) times daily.   Yes [provider]  Multiple Vitamin (MULTIVITAMIN WITH MINERALS)  TABS tablet Take 1 tablet by mouth daily with lunch. Alive or Centrum Silver    [provider]   Allergies  Allergen Reactions  . Nsaids Other (See Comments)    H/O GI BLEED  . Lactose Intolerance (Gi)   . Meloxicam     Other reaction(s): Other (See Comments) Upper GI Bleeding   Review of Systems  Constitutional: Positive for activity change, appetite change and fatigue.  Neurological: Positive for dizziness and weakness.  All other systems reviewed and are negative.   Physical Exam  Constitutional: Vital signs are normal. She is cooperative. She appears ill.  Thin, frail, chronically ill appearing   Cardiovascular: Regular rhythm, normal heart sounds and normal pulses. Tachycardia present.  Hypotensive   Pulmonary/Chest: Effort normal. She has decreased breath sounds.  Abdominal: Normal appearance and bowel sounds are normal.  Musculoskeletal:  Generalized weakness   Neurological: She is alert.  Alert to self and family.   Skin: Skin is warm and dry. Bruising noted.  Psychiatric: She has a normal mood and affect. Her speech is normal and behavior is normal. Cognition and memory are impaired. She expresses inappropriate judgment.  Wears dentures that are loose fitting   Nursing note and vitals reviewed.   Vital Signs: BP (!) 105/56 (BP Location: Left Arm)   Pulse (!) 101   Temp 98 F (36.7 C)   Resp (!) 21   Ht 5' (1.524 m)   Wt 65.8 kg   SpO2 95%   BMI 28.33 kg/m  Pain Scale: 0-10   Pain Score: 0-No pain   SpO2: SpO2: 95 % O2 Device:SpO2: 95 % O2 Flow Rate: .   IO: Intake/output summary:   Intake/Output Summary (Last 24 hours) at 05/25/2018 1322 Last data filed at 05/25/2018 0941 Gross per 24 hour  Intake 0 ml  Output 250 ml  Net -250 ml    LBM:   Baseline Weight: Weight: 65.8 kg Most recent weight: Weight: 65.8 kg     Palliative Assessment/Data: PPS 20%, OOB with assistance   Time In: 1045 Time Out: 1215 Time Total: 90 min  Greater  than 50%  of this time was spent counseling and coordinating care related to the above assessment and plan.  Signed by:  Alda Lea, AGPCNP-BC Palliative Medicine Team  Phone: [phone] Fax: [phone] Pager: [phone] Amion: Bjorn Pippin    Please contact Palliative Medicine Team phone at [phone] for questions and concerns.  For individual provider: See Shea Evans

## 2018-05-25 NOTE — Evaluation (Signed)
Clinical/Bedside Swallow Evaluation Patient Details  Name: Tina [name] MRN: [id number] Date of Birth: [birth year]  Today's Date: 05/25/2018 Time: SLP Start Time (ACUTE ONLY): 1340 SLP Stop Time (ACUTE ONLY): 1440 SLP Time Calculation (min) (ACUTE ONLY): 60 min  Past Medical History:  Past Medical History:  Diagnosis Date  . Anxiety   . Cancer ([place])    breast  . Dementia ([place])   . Hypothyroidism   . Shingles    Past Surgical History:  Past Surgical History:  Procedure Laterality Date  . APPENDECTOMY    . BREAST SURGERY    . EYE SURGERY    . KYPHOPLASTY N/A 06/27/2015   Procedure: KYPHOPLASTY L4;  Surgeon: Hessie Knows, MD;  Location: [facility] ORS;  Service: Orthopedics;  Laterality: N/A;  . KYPHOPLASTY N/A 05/10/2018   Procedure: [place] Arch;  Surgeon: Hessie Knows, MD;  Location: [facility] ORS;  Service: Orthopedics;  Laterality: N/A;  . ORIF WRIST FRACTURE Bilateral 03/24/2018   Procedure: OPEN REDUCTION INTERNAL FIXATION (ORIF) WRIST FRACTURE;  Surgeon: Hessie Knows, MD;  Location: [facility] ORS;  Service: Orthopedics;  Laterality: Bilateral;   HPI:  Pt is a 84 y.o. female has a past medical history significant for breast cancer, Dementia, thyroid disease, and anxiety. Pt presents to the ED presents again after another fall.  She is recently had a fall and had bilateral wrist repaired.  She was discharged from rehab and had another fall and then had a kyphoplasty.  She has been falling a lot at home.  Daughter states that she has been confused.  She feels dizzy a lot.  Her blood pressures always been on the lower side.  She is lost 20 pounds since being discharged from rehab.  She has had diarrhea but that is now under control.  She has been having trouble starting her urination.  Daughter also states that she has been having episodes of nausea vomiting and poor appetite and not eating very well.  In the ER, she was found to have 3 rib fractures and acute kidney injury.  Of note, pt presents  w/ VERY ill-fitting UPPER Denture plate - it often falls forward out of her mouth when talking. Per report, she refuses to remove them, and adhesive does not help to secure them.    Assessment / Plan / Recommendation Clinical Impression  Pt appears to present w/ adequate oropharyngeal phase swallowing function w/ NO overt s/s of aspiration noted w/ trials of thin liquids Via Straw, purees following general aspiration precautions. However, pt has a significantly Ill-Fitting Upper denture plate which fell forward/out when opening mouth to talk and/or sip liquids. Attempted to use Adhesive which was not consistently effective. The loose Upper Denture could be a choking hazard for pt because of food getting caught under and behind them then shove them posteriorly too quickly causing choking. Pt consumed trials of thin liquids, purees w/ no overt s/s of aspiration; clear vocal quality noted b/t trials and no decline in respiratory effort. Oral phase appeared [facility] for bolus management of the liquids and purees. OM exam appeared [facility] w/ no unilateral weakness noted. Pt does have a baseline of Cognitive decline; Dementia.  Due to pt's problem of significantly Ill-Fitting Upper Denture plate, it is recommended that pt's Upper Denture plate be REMOVED and pt be on a PUREE diet w/ thin liquids. Pt states she enjoys drinking Ensure; recommend continue such for nutritional support. Suspect pt's recent weight loss (reported in chart) could be impacted by the inability to effectively chew/masticate  foods secondary to the Ill-Fitting Upper Denture plate. Recommend general aspiration precautions; Pills w/ Ensure or in Puree - CRUSHED if indicated for safer swallowing, clearing. MD consulted re: pt's status and the above. MD agreed w/ removing the Upper Denture plate d/t risk for choking. NSG updated and will f/u w/ family when present. No further skilled ST services indicated at this time as pt is unable to wear her Upper Denture  plate safely, effectively.  SLP Visit Diagnosis: Dysphagia, unspecified (R13.10)    Aspiration Risk  Mild aspiration risk(reduced following precautions and NOT wearing Upper plate)    Diet Recommendation  Puree diet w/ thin liquids d/t Ill-Fitting Upper Denture plate; general aspiration precautions; setup at meals; Drink nutritional supplement for easier intake  Medication Administration: Whole meds with puree(vs Crushed as needed for safe swallowing, clearing)    Other  Recommendations Recommended Consults: (Dietician f/u; Dental consult for upper plate) Oral Care Recommendations: Oral care BID;Staff/trained caregiver to provide oral care Other Recommendations: (n/a)   Follow up Recommendations None      Frequency and Duration (n/a)  (n/a)       Prognosis Prognosis for Safe Diet Advancement: Fair(-Good when using a modified diet w/out upper plate) Barriers to Reach Goals: Cognitive deficits(baseline)      Swallow Study   General Date of Onset: 05/23/18 HPI: Pt is a 84 y.o. female has a past medical history significant for breast cancer, Dementia, thyroid disease, and anxiety. Pt presents to the ED presents again after another fall.  She is recently had a fall and had bilateral wrist repaired.  She was discharged from rehab and had another fall and then had a kyphoplasty.  She has been falling a lot at home.  Daughter states that she has been confused.  She feels dizzy a lot.  Her blood pressures always been on the lower side.  She is lost 20 pounds since being discharged from rehab.  She has had diarrhea but that is now under control.  She has been having trouble starting her urination.  Daughter also states that she has been having episodes of nausea vomiting and poor appetite and not eating very well.  In the ER, she was found to have 3 rib fractures and acute kidney injury.  Of note, pt presents w/ VERY ill-fitting UPPER Denture plate - it often falls forward out of her mouth when  talking. Per report, she refuses to remove them, and adhesive does not help to secure them.  Type of Study: Bedside Swallow Evaluation Previous Swallow Assessment: 04/2017; pt denies any difficulty swallowing Diet Prior to this Study: Regular;Thin liquids Temperature Spikes Noted: No(wbc 10.4) Respiratory Status: Room air History of Recent Intubation: No Behavior/Cognition: Alert;Cooperative;Pleasant mood;Confused;Distractible;Requires cueing(baseline Dementia) Oral Cavity Assessment: (food debris) Oral Care Completed by SLP: Yes(dentures removed and cleaned) Oral Cavity - Dentition: Dentures, top;Dentures, bottom(very ill-fitting) Vision: Functional for self-feeding Self-Feeding Abilities: Able to feed self;Needs assist;Needs set up Patient Positioning: Upright in bed(needed some positioning) Baseline Vocal Quality: Normal Volitional Cough: Strong Volitional Swallow: Able to elicit(cues)    Oral/Motor/Sensory Function Overall Oral Motor/Sensory Function: Within functional limits   Ice Chips Ice chips: Not tested   Thin Liquid Thin Liquid: Within functional limits Presentation: Straw;Self Fed(10 trials) Other Comments: upper denture plate fell forward/out when opening mouth to sip    Nectar Thick Nectar Thick Liquid: Not tested   Honey Thick Honey Thick Liquid: Not tested Presentation: Self fed   Puree Puree: Within functional limits Presentation: Spoon(fed; 5 trials)  Other Comments: upper denture plate fell forward/out when opening mouth to sip liquids   Solid     Solid: Not tested Other Comments: d/t loose upper denture plate      Orinda Kenner, MS, CCC-SLP Watson,Katherine 05/25/2018,3:15 PM

## 2018-05-25 NOTE — Progress Notes (Signed)
Clinical Education officer, museum (CSW) attempted to give patient bed offers however she stated she did not want to go to SNF. CSW contacted patient's daughter Tina [name] and she stated that her and her brother Tina [name] prefer for patient to return home with [facility] home health. CSW attempted to contact [name] however he did not answer and a voicemail was left.   [employer], LCSW [phone]

## 2018-05-25 NOTE — Progress Notes (Signed)
PHARMACIST - PHYSICIAN COMMUNICATION  CONCERNING:  Enoxaparin (Lovenox) for DVT Prophylaxis    RECOMMENDATION: Patient was prescribed enoxaprin 30mg  q24 hours for VTE prophylaxis. CrCl has now improved.    Filed Weights   05/23/18 1112  Weight: 145 lb 1 oz (65.8 kg)    Body mass index is 28.33 kg/m.  Estimated Creatinine Clearance: 37 mL/min (by C-G formula based on SCr of 0.89 mg/dL).   Based on [place] patient is now candidate for enoxaparin 40mg  every 24 based on CrCl >44ml/min.  DESCRIPTION: Pharmacy has adjusted enoxaparin dose per [facility] policy.  Patient is now receiving enoxaparin 40mg  every 24 hours.    Pernell Dupre, PharmD, BCPS Clinical Pharmacist 05/25/2018 5:22 AM

## 2018-05-26 MED ORDER — SODIUM CHLORIDE 0.9% FLUSH
3.0000 mL | Freq: Two times a day (BID) | INTRAVENOUS | Status: DC
  Administered 2018-05-26 – 2018-05-27 (×2): 3 mL via INTRAVENOUS

## 2018-05-26 NOTE — Progress Notes (Signed)
[place] at [place] NAME: Tina [name]    MR#:  [id number]  DATE OF BIRTH:  Oct 05, 1927  SUBJECTIVE:  CHIEF COMPLAINT:   Chief Complaint  Patient presents with  . Fall  Patient seen and evaluated today Awake Lying on the bed No abdominal pain And nausea and vomiting yesterday Patient was evaluated with speech therapy and put on pured diet to avoid aspiration Has loose fitting dentures No complaints of chest pain or shortness of breath   REVIEW OF SYSTEMS:    ROS  CONSTITUTIONAL: No documented fever. No fatigue, weakness. No weight gain, no weight loss.  EYES: No blurry or double vision.  ENT: No tinnitus. No postnasal drip. No redness of the oropharynx.  RESPIRATORY: No cough, no wheeze, no hemoptysis. No dyspnea.  CARDIOVASCULAR: No chest pain. No orthopnea. No palpitations. No syncope.  GASTROINTESTINAL: has nausea, no vomiting or diarrhea.  Has decreased abdominal pain. No melena or hematochezia.  GENITOURINARY: No dysuria or hematuria.  ENDOCRINE: No polyuria or nocturia. No heat or cold intolerance.  HEMATOLOGY: No anemia. No bruising. No bleeding.  INTEGUMENTARY: No rashes. No lesions.  MUSCULOSKELETAL: No arthritis. No swelling. No gout.  NEUROLOGIC: No numbness, tingling, or ataxia. No seizure-type activity.  PSYCHIATRIC: No anxiety. No insomnia. No ADD.   DRUG ALLERGIES:   Allergies  Allergen Reactions  . Nsaids Other (See Comments)    H/O GI BLEED  . Lactose Intolerance (Gi)   . Meloxicam     Other reaction(s): Other (See Comments) Upper GI Bleeding    VITALS:  Blood pressure 97/65, pulse 100, temperature 98.6 F (37 C), temperature source Oral, resp. rate 16, height 5' (1.524 m), weight 65.8 kg, SpO2 93 %.  PHYSICAL EXAMINATION:   Physical Exam  GENERAL:  84 y.o.-year-old patient lying in the bed with no acute distress.  EYES: Pupils equal, round, reactive to light and accommodation. No scleral icterus.  Extraocular muscles intact.  HEENT: Head atraumatic, normocephalic. Oropharynx and nasopharynx clear.  Loosefitting dentures NECK:  Supple, no jugular venous distention. No thyroid enlargement, no tenderness.  LUNGS: Normal breath sounds bilaterally, no wheezing, rales, rhonchi. No use of accessory muscles of respiration.  CARDIOVASCULAR: S1, S2 normal. No murmurs, rubs, or gallops.  ABDOMEN: Soft, nontender, nondistended. Bowel sounds present. No organomegaly or mass.  EXTREMITIES: No cyanosis, clubbing or edema b/l.    NEUROLOGIC: Cranial nerves II through XII are intact. No focal Motor or sensory deficits b/l.   PSYCHIATRIC: The patient is alert and oriented x 2.  SKIN: No obvious rash, lesion, or ulcer.   LABORATORY PANEL:   CBC Recent Labs  Lab 05/24/18 0401  WBC 10.4  HGB 10.2*  HCT 31.0*  PLT 252   ------------------------------------------------------------------------------------------------------------------ Chemistries  Recent Labs  Lab 05/24/18 0401 05/25/18 0327  NA 138 137  K 3.8 3.5  CL 107 108  CO2 22 23  GLUCOSE 96 98  BUN 15 15  CREATININE 0.97 0.89  CALCIUM 7.4* 7.8*  AST 41  --   ALT 20  --   ALKPHOS 281*  --   BILITOT 1.1  --    ------------------------------------------------------------------------------------------------------------------  Cardiac Enzymes No results for input(s): TROPONINI in the last 168 hours. ------------------------------------------------------------------------------------------------------------------  RADIOLOGY:  No results found.   ASSESSMENT AND PLAN:  84 year old female patient with history of dementia, breast cancer, hypothyroidism has frequent falls.  -Acute kidney injury improved with IV fluids Discontinued IV fluids and encouraged oral intake Follow-up renal function  -  Nausea and vomiting Has loose fitting dentures with high risk of aspiration Status post speech therapy evaluation On pured  diet Antiemetics  -Rib fractures secondary to fall Pain management and supportive care  -Gait instability and ambulatory dysfunction History of frequent falls Physical therapy evaluation appreciated Home health services at home once discharged  -Cholelithiasis Status post surgery consultation No intervention or surgery recommended at this time Patient is a poor surgical candidate considering her advanced age and multiple comorbidities Surgical consultant deferred surgery  -Dementia Continue Aricept and supportive care  -Nausea and vomiting improving Antiemetics as needed  -Postherpetic neuralgia Continue gabapentin  -DVT prophylaxis subcu Lovenox daily  -Palliative medicine consult Discuss goals of care with the family  -Disposition Home with hospice versus home health services in am   All the records are reviewed and case discussed with Care Management/Social Worker. Management plans discussed with the patient, family and they are in agreement.  CODE STATUS: full code  DVT Prophylaxis: SCDs  TOTAL TIME TAKING CARE OF THIS PATIENT: 32 minutes.   POSSIBLE D/C IN 2 to 3 DAYS, DEPENDING ON CLINICAL CONDITION.  Saundra Shelling M.D on 05/26/2018 at 10:21 AM  Between 7am to 6pm - Pager - [phone]  After 6pm go to [email or web address] - password [email or web address] [place] [facility]  Office  [phone]  CC: Primary care physician; Derinda Late, MD  Note: This dictation was prepared with Dragon dictation along with smaller phrase technology. Any transcriptional errors that result from this process are unintentional.

## 2018-05-26 NOTE — Progress Notes (Deleted)
Pt nonverbal; alert; confused. Eats only few bites/drinks with straw slowly. PO supplements given. Moved to low bed for safety. Husband in and participated in pt care. Up in chair with 2+ and tolerated well. 1 large watery brown stool with imodium given with no diarrhea since.

## 2018-05-27 MED ORDER — ENSURE ENLIVE PO LIQD: 237.0000 mL | Freq: Two times a day (BID) | ORAL | 12 refills | Status: AC

## 2018-05-27 NOTE — Discharge Summary (Signed)
[place] at [place] NAME: Tina [name]    MR#:  [id number]  DATE OF BIRTH:  [birth year]  DATE OF ADMISSION:  05/23/2018 ADMITTING PHYSICIAN: Loletha Grayer, MD  DATE OF DISCHARGE: 05/27/2018 11:45 AM  PRIMARY CARE PHYSICIAN: Derinda Late, MD   ADMISSION DIAGNOSIS:  Nausea & vomiting [R11.2] AKI (acute kidney injury) ([place]) [N17.9] Closed fracture of multiple ribs of left side, initial encounter [S22.42XA] Alzheimer's dementia without behavioral disturbance, unspecified timing of dementia onset ([place]) [G30.9, F02.80]  DISCHARGE DIAGNOSIS:  Active Problems:   Acute kidney injury ([place])   Pressure injury of skin   Calculus of gallbladder without cholecystitis without obstruction Alzheimer's dementia Multiple rib fractures secondary to fall Gait instability  SECONDARY DIAGNOSIS:   Past Medical History:  Diagnosis Date  . Anxiety   . Cancer ([place])    breast  . Dementia ([place])   . Hypothyroidism   . Shingles      ADMITTING HISTORY Tina [name]  is a 84 y.o. female with a known history of dementia presents again after another fall.  She is recently had a fall and had bilateral wrist repaired.  She was discharged from rehab and had another fall and then had a kyphoplasty.  She has been falling a lot at home.  Patient answers no to most questions.  Daughter states that she has been confused.  She feels dizzy a lot.  Her blood pressures always been on the lower side.  She is lost 20 pounds since being discharged from rehab.  She has had diarrhea but that is now under control.  She has been having trouble starting her urination.  Daughter also states that she has been having episodes of nausea vomiting and poor appetite and not eating very well.  In the ER, she was found to have 3 rib fractures and acute kidney injury and hospitalist services were contacted for further evaluation   HOSPITAL COURSE:  Patient was admitted to medical floor.  She  was hydrated with IV fluids.  Her kidney functions improved.  She received physical therapy.  Palliative medicine consultation was also done.  CT chest revealed multiple rib fractures.  Pain management was done.  She was evaluated by surgical attending for cholelithiasis but no evidence of cholecystitis.  They do not recommend any surgery.  Her liver function tests were normal.  Patient's family wanted patient to be discharged home with home health services and palliative care services.  Patient's nausea and vomiting also resolved with antiemetics.  Her dehydration resolved.  Patient will be discharged home.  CONSULTS OBTAINED:  Surgery consult Palliative medicine consult  DRUG ALLERGIES:   Allergies  Allergen Reactions  . Nsaids Other (See Comments)    H/O GI BLEED  . Lactose Intolerance (Gi)   . Meloxicam     Other reaction(s): Other (See Comments) Upper GI Bleeding    DISCHARGE MEDICATIONS:   Allergies as of 05/27/2018      Reactions   Nsaids Other (See Comments)   H/O GI BLEED   Lactose Intolerance (gi)    Meloxicam    Other reaction(s): Other (See Comments) Upper GI Bleeding      Medication List    STOP taking these medications   PRESERVISION AREDS 2 PO     TAKE these medications   acetaminophen 325 MG tablet Commonly known as:  TYLENOL Take 2 tablets (650 mg total) by mouth every 6 (six) hours as needed for mild pain (or Fever >/=  101).   alendronate 70 MG tablet Commonly known as:  FOSAMAX Take 70 mg by mouth every Sunday.   aspirin EC 81 MG tablet Take 81 mg by mouth daily.   CALCIUM/D3 ADULT GUMMIES PO Take 2 tablets by mouth 2 (two) times daily. With lunch & supper   citalopram 10 MG tablet Commonly known as:  CELEXA Take 10 mg by mouth daily.   dicyclomine 20 MG tablet Commonly known as:  BENTYL Take 20 mg by mouth 4 (four) times daily.   donepezil 10 MG tablet Commonly known as:  ARICEPT Take 10 mg by mouth at bedtime.   feeding supplement  (ENSURE ENLIVE) Liqd Take 237 mLs by mouth 2 (two) times daily between meals.   fluticasone 50 MCG/ACT nasal spray Commonly known as:  FLONASE Place 2 sprays into both nostrils at bedtime.   gabapentin 300 MG capsule Commonly known as:  NEURONTIN Take 600 mg 3 (three) times daily by mouth.   levothyroxine 112 MCG tablet Commonly known as:  SYNTHROID, LEVOTHROID Take 112 mcg by mouth daily before breakfast.   meloxicam 7.5 MG tablet Commonly known as:  MOBIC Take 7.5 mg by mouth.   multivitamin with minerals Tabs tablet Take 1 tablet by mouth daily with lunch. Alive or Centrum Silver   pantoprazole 40 MG tablet Commonly known as:  PROTONIX Take 1 tablet (40 mg total) by mouth 2 (two) times daily.   POTASSIMIN PO Take 1 tablet by mouth at bedtime.   Probiotic 250 MG Caps Take 250 mg by mouth 2 (two) times daily.       Today  Patient seen and evaluated today Tolerating diet well No new overnight events  VITAL SIGNS:  Blood pressure 101/69, pulse (!) 109, temperature 98.1 F (36.7 C), temperature source Oral, resp. rate 17, height 5' (1.524 m), weight 65.8 kg, SpO2 91 %.  I/O:    Intake/Output Summary (Last 24 hours) at 05/27/2018 1239 Last data filed at 05/26/2018 1800 Gross per 24 hour  Intake 240 ml  Output -  Net 240 ml    PHYSICAL EXAMINATION:  Physical Exam  GENERAL:  84 y.o.-year-old patient lying in the bed with no acute distress.  LUNGS: Normal breath sounds bilaterally, no wheezing, rales,rhonchi or crepitation. No use of accessory muscles of respiration.  CARDIOVASCULAR: S1, S2 normal. No murmurs, rubs, or gallops.  ABDOMEN: Soft, non-tender, non-distended. Bowel sounds present. No organomegaly or mass.  NEUROLOGIC: Moves all 4 extremities. PSYCHIATRIC: The patient is alert and oriented x 2 SKIN: No obvious rash, lesion, or ulcer.   DATA REVIEW:   CBC Recent Labs  Lab 05/24/18 0401  WBC 10.4  HGB 10.2*  HCT 31.0*  PLT 252     Chemistries  Recent Labs  Lab 05/24/18 0401 05/25/18 0327  NA 138 137  K 3.8 3.5  CL 107 108  CO2 22 23  GLUCOSE 96 98  BUN 15 15  CREATININE 0.97 0.89  CALCIUM 7.4* 7.8*  AST 41  --   ALT 20  --   ALKPHOS 281*  --   BILITOT 1.1  --     Cardiac Enzymes No results for input(s): TROPONINI in the last 168 hours.  Microbiology Results  Results for orders placed or performed during the hospital encounter of 03/23/18  Surgical pcr screen     Status: None   Collection Time: 03/23/18  9:41 PM  Result Value Ref Range Status   MRSA, PCR NEGATIVE NEGATIVE Final   Staphylococcus aureus NEGATIVE  NEGATIVE Final    Comment: (NOTE) The Xpert SA Assay (FDA approved for NASAL specimens in patients 61 years of age and older), is one component of a comprehensive surveillance program. It is not intended to diagnose infection nor to guide or monitor treatment. Performed at [facility], [address], [place], [place] [address]     RADIOLOGY:  No results found.  Follow up with PCP in 1 week.  Management plans discussed with the patient, family and they are in agreement.  CODE STATUS: Full code    Code Status Orders  (From admission, onward)         Start     Ordered   05/27/18 1040  Full code  Continuous     05/27/18 1039        Code Status History    Date Active Date Inactive Code Status Order ID Comments User Context   05/27/2018 0951 05/27/2018 1039 DNR [id number]  Saundra Shelling, MD Inpatient   05/25/2018 1823 05/27/2018 0951 Full Code [id number]  Hillary Bow, MD Inpatient   05/25/2018 1213 05/25/2018 1823 DNR [id number]  Jimmy Footman, NP Inpatient   05/23/2018 1544 05/25/2018 1213 Full Code [id number]  Loletha Grayer, MD ED   05/10/2018 1807 05/11/2018 1805 DNR [id number]  Hessie Knows, MD Inpatient   05/10/2018 1631 05/10/2018 1807 Full Code [id number]  Hessie Knows, MD Inpatient   03/23/2018 2050 03/26/2018 1705 Full Code [id number]   Loletha Grayer, MD ED   05/15/2017 1451 05/18/2017 1446 Full Code [id number]  Idelle Crouch, MD Inpatient   06/27/2015 1557 06/27/2015 1932 Full Code [id number]  Hessie Knows, MD Inpatient    Advance Directive Documentation     Most Recent Value  Type of Advance Directive  Healthcare Power of Attorney  Pre-existing out of facility DNR order (yellow form or pink MOST form)  -  "MOST" Form in Place?  -      TOTAL TIME TAKING CARE OF THIS PATIENT ON DAY OF DISCHARGE: more than 34 minutes.   Saundra Shelling M.D on 05/27/2018 at 12:39 PM  Between 7am to 6pm - Pager - [phone]  After 6pm go to [email or web address] - password [email or web address] [place] [facility]  Office  [phone]  CC: Primary care physician; Derinda Late, MD  Note: This dictation was prepared with Dragon dictation along with smaller phrase technology. Any transcriptional errors that result from this process are unintentional.

## 2018-05-27 NOTE — Care Management Important Message (Signed)
Copy of signed IM left with patient in room.  

## 2018-05-27 NOTE — Discharge Instructions (Signed)
Alzheimer Disease Alzheimer disease is a brain disease that affects memory, thinking, and behavior. People with Alzheimer disease lose mental abilities, and the disease gets worse over time. Survival with Alzheimer disease ranges from several years to as long as 20 years. What are the causes? This condition develops when a protein called beta-amyloid forms deposits in the brain. It is not known what causes these deposits to form. What increases the risk? This condition is more likely to develop in people who:  Are elderly.  Have a family history of dementia.  Have had a brain injury.  Have heart or blood vessel disease.  Have had a stroke.  Have high blood pressure or high cholesterol.  Have diabetes.  What are the signs or symptoms? Symptoms of this condition happen in three stages, which often overlap. Early stage In this stage, you may continue to be independent. You may still be able to drive, work, and be social. Symptoms in this stage include:  Minor memory problems, such as forgetting a name or what you read.  Difficulty with: ? Paying attention. ? Communicating. ? Doing familiar tasks. ? Learning new things.  Needing more time to do daily activities.  Anxiety.  Social withdrawal.  Loss of motivation.  Moderate stage In this stage, you will start to need care. This stage usually lasts the longest. Symptoms in this stage include:  Difficulty with expressing thoughts.  Memory loss that affects daily life. This can include forgetting: ? Your address or phone number. ? Events that have happened. ? Parts of your personal history, like where you went to school.  Confusion about where you are or what time it is.  Difficulty in judging distance.  Changes in personality, mood, and behavior. You may be moody, irritable, angry, frustrated, fearful, anxious, or suspicious.  Poor reasoning and judgment.  Delusions or hallucinations.  Changes in sleep  patterns.  Wandering and getting lost.  Severe stage In the final stage, you will need help with your personal care and dailyactivities. Symptoms in this stage include:  Worsening memory loss.  Personality changes.  Loss of awareness of your surroundings.  Changes in physical abilities, including the ability to walk, sit, and swallow.  Difficulty in communicating.  Inability to control the bladder and bowels.  Increasing confusion.  Increasing disruptive behavior.  How is this diagnosed? This condition is diagnosed with an assessment by your health care provider. During this assessment, your health care provider will talk with you and your family, friends, or caregivers about your symptoms. A thorough medical history will be taken, and you will have a physical exam and tests. Tests may include:  Lab tests, such as blood or urine tests.  Imaging tests, such as a CT scan, PET scan, or MRI.  A lumbar puncture. This test involves removing and testing a small amount of the fluid that surrounds the brain and spinal cord.  An electroencephalogram (EEG). In this test, small metal discs are used to measure electrical activity in the brain.  Memory tests, cognitive tests, and neuropsychological tests. These tests evaluate brain function.  How is this treated? At this time, there is no treatment to cure Alzheimer disease or stop it from getting worse. The goals of treatment are:  To slow down the disease.  To manage behavioral problems.  To provide you with a safe environment.  To make life easier for you and your caregivers.  The following treatment options are available:  Medicines. Medicines may help to slow down  memory loss and control behavioral symptoms.  Talk therapy. Talk therapy provides you with education, support, and memory aids. It is most helpful in the early stages of the condition.  Counseling or spiritual guidance. It is normal to have a lot of feelings,  including anger, relief, fear, and isolation. Counseling and guidance can help you deal with these feelings.  Caregiving. This involves having caregivers help you with your daily activities. Caregivers may be family members, friends, or trained medical professionals. Caregiving can be done at home or outside the home.  Family support groups. These provide education, emotional support, and information about community resources to family members who are taking care of you.  Follow these instructions at home: Medicines  Take over-the-counter and prescription medicines only as told by your health care provider.  Avoid taking medicines that can affect thinking, such as pain or sleeping medicines. Lifestyle   Make healthy lifestyle choices: ? Be physically active as told by your health care provider. ? Do not use any tobacco products, such as cigarettes, chewing tobacco, and e-cigarettes. If you need help quitting, ask your health care provider. ? Eat a healthy diet. ? Practice stress-management techniques when you get stressed. ? Stay social.  Drink enough fluid to keep your urine clear or pale yellow.  Make sure to get quality sleep. These tips can help you get a good night's rest: ? Avoid napping during the day. ? Keep your sleeping area dark and cool. ? Avoid exercising during the few hours before you go to bed. ? Avoid caffeine products in the evening. General instructions  Work with your health care provider to determine what you need help with and what your safety needs are.  If you were given a bracelet that tracks your location, make sure to wear it.  Keep all follow-up visits as told by your health care provider. This is important.  If you have questions or would like additional support, you may contact The Alzheimer's Association: ? 24-hour helpline: (757) 500-4589 ? Website: CapitalMile.co.nz Contact a health care provider if:  You have nausea, vomiting, or trouble with  eating.  You have dizziness, or weakness.  You have new or worsening trouble with sleeping.  You or your family members become concerned for your safety. Get help right away if:  You develop chest pain or difficulty with breathing.  You pass out. This information is not intended to replace advice given to you by your health care provider. Make sure you discuss any questions you have with your health care provider. Document Released: 02/25/2004 Document Revised: 02/14/2016 Document Reviewed: 03/13/2015 Elsevier Interactive Patient Education  2018 [place] Disease Caregiver Guide Alzheimer disease is a brain disease that causes memory loss and changes in behavior. People with Alzheimer disease often have problems paying attention, communicating, and doing routine tasks. The disease gets worse over time, and people with the disease eventually need full-time care. Taking care of someone with Alzheimer disease can be challenging and overwhelming. This guide provides helpful information and tips that can make caring for someone with the disease a little easier. What changes does Alzheimer disease cause? Alzheimer disease causes a person to lose the ability to remember things and make decisions. Memory loss and confusion are usually mild at the start of the disease, but they get more severe over time. Eventually, the person may not recognize friends, family members, or familiar places. Alzheimer disease can also cause hallucinations, changes in behavior, and changes in mood, such  as anxiety or anger. The changes can come on suddenly. They may happen in response to something such as:  Pain.  An infection.  Changes in environment, such as changes in temperature or noise.  Overstimulation.  Feeling lost or scared.  Tips for managing symptoms  Be calm and patient.  Give short, simple answers to questions. Long answers can overwhelm and confuse the person.  Avoid  correcting the person in a negative way.  Try not to take things personally, even if the person forgets your name. Understand that changes are a part of the disease process.  Do not argue or try to convince the person about a specific point. Doing that may make the person feel more agitated. Tips for reducing frustration  Make appointments and do daily tasks, like bathing and dressing, when the person is at his or her best.  Allow for plenty of time for simple tasks because they may take longer than expected. Take your time when doing these tasks.  Limit the person's choices. Too many choices can be overwhelming and stressful for the person.  Involve the person in what you are doing.  Keep a daily routine.  Avoid crowds and new situations, if possible.  Use simple words, short sentences, and a calm voice. Only give one direction at a time.  Buy clothes and shoes that are easy to put on and take off.  Organize medications in a pillbox for each day of the week.  Keep a calendar in a central location to remind the person of appointments or other activities.  Ask about respite care resources so that you can have a regular break from the stress of caregiving. Tips for reducing the risk of injury  Keep floors clear of clutter. Remove rugs, magazine racks, and floor lamps.  Keep hallways well-lit, especially at night.  Put a handrail and nonslip mat in the bathtub or shower.  Put childproof locks on cabinets that contain dangerous items, such as medicines, alcohol, guns, toxic cleaning items, sharp tools or utensils, matches, and lighters.  Put locks on doors. Put the locks in places where the person cannot see or reach them easily. This will help ensure that the person does not wander out of the house and get lost.  Be prepared for emergencies. Keep a list of emergency phone numbers and addresses in a convenient area.  Remove car keys and lock garage doors so that the person does  not try to get in the car and drive.  A certain type of bracelet may be worn that tracks a person's location and identifies him or her as having memory problems. This should be worn at all times for safety. Tips for future planning  Discuss financial and legal planning early on in the course of the disease. People with Alzheimer disease will have trouble managing their money as the disease gets worse. Get help from professional advisers regarding financial and legal matters.  Discuss advance directives, safety, and daily care. Take these steps: ? Create a living will and choose a power of attorney. The person with power of attorney will be able to make decisions for the person with Alzheimer disease when he or she is no longer able to. ? Discuss driving safety and when to stop driving. The person's health care provider can help provide assistance with this decision. ? Discuss the person's living situation. If the person lives alone, make sure he or she is safe. People who live at home may need extra  help from home health caregivers, and those who live in a nursing home or care center may need more care. Where to find support: One way to find support is to join a local support group. Advantages of being part of a support group include:  Learning strategies to manage stress.  Sharing experiences with others.  Receiving emotional comfort and support.  Learning about caregiving as the disease progresses.  Knowing what community resources are available and making use of them.  Where to find more information:  Alzheimer's Association: CapitalMile.co.nz Contact a health care provider if:  The person has a fever.  The person has a sudden change in behavior that does not improve with calming strategies.  The person is unable to manage in his or her current living situation.  The person threatens himself or herself, you, or anyone else.  You are no longer able to care for the  person. Summary  Alzheimer disease is a brain disease that causes memory loss and changes in behavior.  People with Alzheimer disease often have problems paying attention, communicating, and doing routine tasks. The disease gets worse over time, and people with the disease eventually need full-time care.  Take steps to reduce the person's risk of injury, and plan for future care.  Taking care of someone with Alzheimer disease can be very challenging and overwhelming. One way to find support during this time is to join a local support group. This information is not intended to replace advice given to you by your health care provider. Make sure you discuss any questions you have with your health care provider. Document Released: 02/25/2004 Document Revised: 07/17/2016 Document Reviewed: 07/17/2016 Elsevier Interactive Patient Education  2018 [employer].   Acute Kidney Injury, Adult Acute kidney injury is a sudden worsening of kidney function. The kidneys are organs that have several jobs. They filter the blood to remove waste products and extra fluid. They also maintain a healthy balance of minerals and hormones in the body, which helps control blood pressure and keep bones strong. With this condition, your kidneys do not do their jobs as well as they should. This condition ranges from mild to severe. Over time it may develop into long-lasting (chronic) kidney disease. Early detection and treatment may prevent acute kidney injury from developing into a chronic condition. What are the causes? Common causes of this condition include:  A problem with blood flow to the kidneys. This may be caused by: ? Low blood pressure (hypotension) or shock. ? Blood loss. ? Heart and blood vessel (cardiovascular) disease. ? Severe burns. ? Liver disease.  Direct damage to the kidneys. This may be caused by: ? Certain medicines. ? A kidney infection. ? Poisoning. ? Being around or in contact with toxic  substances. ? A surgical wound. ? A hard, direct hit to the kidney area.  A sudden blockage of urine flow. This may be caused by: ? Cancer. ? Kidney stones. ? An enlarged prostate in males.  What are the signs or symptoms? Symptoms of this condition may not be obvious until the condition becomes severe. Symptoms of this condition can include:  Tiredness (lethargy), or difficulty staying awake.  Nausea or vomiting.  Swelling (edema) of the face, legs, ankles, or feet.  Problems with urination, such as: ? Abdominal pain, or pain along the side of your stomach (flank). ? Decreased urine production. ? Decrease in the force of urine flow.  Muscle twitches and cramps, especially in the legs.  Confusion or trouble  concentrating.  Loss of appetite.  Fever.  How is this diagnosed? This condition may be diagnosed with tests, including:  Blood tests.  Urine tests.  Imaging tests.  A test in which a sample of tissue is removed from the kidneys to be examined under a microscope (kidney biopsy).  How is this treated? Treatment for this condition depends on the cause and how severe the condition is. In mild cases, treatment may not be needed. The kidneys may heal on their own. In more severe cases, treatment will involve:  Treating the cause of the kidney injury. This may involve changing any medicines you are taking or adjusting your dosage.  Fluids. You may need specialized IV fluids to balance your body's needs.  Having a catheter placed to drain urine and prevent blockages.  Preventing problems from occurring. This may mean avoiding certain medicines or procedures that can cause further injury to the kidneys.  In some cases treatment may also require:  A procedure to remove toxic wastes from the body (dialysis or continuous renal replacement therapy - CRRT).  Surgery. This may be done to repair a torn kidney, or to remove the blockage from the urinary system.  Follow  these instructions at home: Medicines  Take over-the-counter and prescription medicines only as told by your health care provider.  Do not take any new medicines without your health care provider's approval. Many medicines can worsen your kidney damage.  Do not take any vitamin and mineral supplements without your health care provider's approval. Many nutritional supplements can worsen your kidney damage. Lifestyle  If your health care provider prescribed changes to your diet, follow them. You may need to decrease the amount of protein you eat.  Achieve and maintain a healthy weight. If you need help with this, ask your health care provider.  Start or continue an exercise plan. Try to exercise at least 30 minutes a day, 5 days a week.  Do not use any tobacco products, such as cigarettes, chewing tobacco, and e-cigarettes. If you need help quitting, ask your health care provider. General instructions  Keep track of your blood pressure. Report changes in your blood pressure as told by your health care provider.  Stay up to date with immunizations. Ask your health care provider which immunizations you need.  Keep all follow-up visits as told by your health care provider. This is important. Where to find more information:  American Association of Kidney Patients: BombTimer.gl  National Kidney Foundation: www.kidney.[place]: https://mathis.com/  [facility]: ? www.lifeoptions.org ? www.kidneyschool.org Contact a health care provider if:  Your symptoms get worse.  You develop new symptoms. Get help right away if:  You develop symptoms of worsening kidney disease, which include: ? Headaches. ? Abnormally dark or light skin. ? Easy bruising. ? Frequent hiccups. ? Chest pain. ? Shortness of breath. ? End of menstruation in women. ? Seizures. ? Confusion or altered mental status. ? Abdominal or back pain. ? Itchiness.  You have a  fever.  Your body is producing less urine.  You have pain or bleeding when you urinate. Summary  Acute kidney injury is a sudden worsening of kidney function.  Acute kidney injury can be caused by problems with blood flow to the kidneys, direct damage to the kidneys, and sudden blockage of urine flow.  Symptoms of this condition may not be obvious until it becomes severe. Symptoms may include edema, lethargy, confusion, nausea or vomiting, and problems passing urine.  This condition can usually be diagnosed with blood tests, urine tests, and imaging tests. Sometimes a kidney biopsy is done to diagnose this condition.  Treatment for this condition often involves treating the underlying cause. It is treated with fluids, medicines, dialysis, diet changes, or surgery. This information is not intended to replace advice given to you by your health care provider. Make sure you discuss any questions you have with your health care provider. Document Released: 12/29/2010 Document Revised: 10/15/2016 Document Reviewed: 06/05/2016 Elsevier Interactive Patient Education  [employer].

## 2018-05-27 NOTE — Plan of Care (Signed)

## 2018-05-27 NOTE — Progress Notes (Signed)
Advanced care plan.  Purpose of the Encounter: CODE STATUS  Parties in Attendance: Patient and family  Patient's Decision Capacity: Not good  Subjective/Patient's story: Presented to the emergency room for frequent falls   Objective/Medical story Has acute kidney injury and dehydration Needs IV fluids and hydration Physical therapy evaluation   Goals of care determination:  Advance care directives and goals of care discussed with patient's family They want everything done which includes CPR, intubation ventilator if the need arises   CODE STATUS: Full code   Time spent discussing advanced care planning: 16 minutes

## 2018-05-27 NOTE — Progress Notes (Signed)
Signed yellow DNR form in discharge packet; clarified with son who does not want DNR active at this time. MD notified with pt continued at Full Code. Pt alert/pleasantly confused. Taking supplements/po's better; eats few bites. Discharge preparations in process with son desiring to take pt home this morning. Will have EMS called for routine transport home.

## 2018-05-27 NOTE — Progress Notes (Signed)
[facility] here to transport pt home with son at bedside. Discharge completed.

## 2018-05-27 NOTE — Care Management (Addendum)
RNCM spoke with daughter Shauna Hugh [phone]. She is requesting that EMS bring patient after lunch today. EMS packet complete. Malachy Mood with [facility] home [facility] and Santiago Glad with [facility] notified of patient discharge to home today. RNCM has updated MD, RN and CSW. Patient listed as full-code in chart however when I placed EMS packet I noticed a DNR transport sheet from 05/24/18. RN and MD updated of need for clarification. RN will check with son.

## 2018-05-28 ENCOUNTER — Emergency Department: Payer: Medicare Other

## 2018-05-28 ENCOUNTER — Inpatient Hospital Stay
Admission: EM | Admit: 2018-05-28 | Discharge: 2018-05-30 | DRG: 641 | Disposition: A | Discharge status: Skilled Nursing Facility | Payer: Medicare Other | Attending: Internal Medicine | Admitting: Internal Medicine

## 2018-05-28 ENCOUNTER — Encounter: Payer: Self-pay | Admitting: Emergency Medicine

## 2018-05-28 DIAGNOSIS — E8770 Fluid overload, unspecified: Principal | ICD-10-CM | POA: Diagnosis present

## 2018-05-28 DIAGNOSIS — E039 Hypothyroidism, unspecified: Secondary | ICD-10-CM | POA: Diagnosis present

## 2018-05-28 DIAGNOSIS — Z7982 Long term (current) use of aspirin: Secondary | ICD-10-CM

## 2018-05-28 DIAGNOSIS — Z79890 Hormone replacement therapy (postmenopausal): Secondary | ICD-10-CM | POA: Diagnosis not present

## 2018-05-28 DIAGNOSIS — R0789 Other chest pain: Secondary | ICD-10-CM

## 2018-05-28 DIAGNOSIS — F17210 Nicotine dependence, cigarettes, uncomplicated: Secondary | ICD-10-CM | POA: Diagnosis present

## 2018-05-28 DIAGNOSIS — R0989 Other specified symptoms and signs involving the circulatory and respiratory systems: Secondary | ICD-10-CM | POA: Diagnosis present

## 2018-05-28 DIAGNOSIS — F419 Anxiety disorder, unspecified: Secondary | ICD-10-CM | POA: Diagnosis present

## 2018-05-28 DIAGNOSIS — F0390 Unspecified dementia without behavioral disturbance: Secondary | ICD-10-CM | POA: Diagnosis present

## 2018-05-28 DIAGNOSIS — S2242XA Multiple fractures of ribs, left side, initial encounter for closed fracture: Secondary | ICD-10-CM | POA: Diagnosis present

## 2018-05-28 DIAGNOSIS — Z7983 Long term (current) use of bisphosphonates: Secondary | ICD-10-CM | POA: Diagnosis not present

## 2018-05-28 DIAGNOSIS — R0902 Hypoxemia: Secondary | ICD-10-CM | POA: Diagnosis present

## 2018-05-28 DIAGNOSIS — E739 Lactose intolerance, unspecified: Secondary | ICD-10-CM | POA: Diagnosis present

## 2018-05-28 DIAGNOSIS — Z8249 Family history of ischemic heart disease and other diseases of the circulatory system: Secondary | ICD-10-CM

## 2018-05-28 DIAGNOSIS — W19XXXA Unspecified fall, initial encounter: Secondary | ICD-10-CM | POA: Diagnosis present

## 2018-05-28 DIAGNOSIS — R531 Weakness: Secondary | ICD-10-CM | POA: Diagnosis present

## 2018-05-28 DIAGNOSIS — R0602 Shortness of breath: Secondary | ICD-10-CM

## 2018-05-28 LAB — CBC
HCT: 31.9 % — ABNORMAL LOW (ref 36.0–46.0)
Hemoglobin: 10.5 g/dL — ABNORMAL LOW (ref 12.0–15.0)
MCH: 28.9 pg (ref 26.0–34.0)
MCHC: 32.9 g/dL (ref 30.0–36.0)
MCV: 87.9 fL (ref 80.0–100.0)
PLATELETS: 235 10*3/uL (ref 150–400)
RBC: 3.63 MIL/uL — AB (ref 3.87–5.11)
RDW: 18.1 % — ABNORMAL HIGH (ref 11.5–15.5)
WBC: 11.8 10*3/uL — ABNORMAL HIGH (ref 4.0–10.5)
nRBC: 0 % (ref 0.0–0.2)

## 2018-05-28 LAB — COMPREHENSIVE METABOLIC PANEL
ALK PHOS: 281 U/L — AB (ref 38–126)
ALT: 22 U/L (ref 0–44)
AST: 45 U/L — ABNORMAL HIGH (ref 15–41)
Albumin: 2.1 g/dL — ABNORMAL LOW (ref 3.5–5.0)
Anion gap: 6 (ref 5–15)
BUN: 20 mg/dL (ref 8–23)
CALCIUM: 8.6 mg/dL — AB (ref 8.9–10.3)
CHLORIDE: 104 mmol/L (ref 98–111)
CO2: 26 mmol/L (ref 22–32)
CREATININE: 0.78 mg/dL (ref 0.44–1.00)
Glucose, Bld: 95 mg/dL (ref 70–99)
Potassium: 4.9 mmol/L (ref 3.5–5.1)
Sodium: 136 mmol/L (ref 135–145)
TOTAL PROTEIN: 5.5 g/dL — AB (ref 6.5–8.1)
Total Bilirubin: 0.9 mg/dL (ref 0.3–1.2)

## 2018-05-28 LAB — LIPASE, BLOOD: LIPASE: 53 U/L — AB (ref 11–51)

## 2018-05-28 LAB — TROPONIN I

## 2018-05-28 MED ORDER — DICYCLOMINE HCL 20 MG PO TABS
20.0000 mg | ORAL_TABLET | Freq: Four times a day (QID) | ORAL | Status: DC
  Administered 2018-05-29 – 2018-05-30 (×6): 20 mg via ORAL
  Filled 2018-05-28 (×9): qty 1

## 2018-05-28 MED ORDER — PROBIOTIC 250 MG PO CAPS: 250.0000 mg | ORAL_CAPSULE | Freq: Two times a day (BID) | ORAL | Status: DC

## 2018-05-28 MED ORDER — PANTOPRAZOLE SODIUM 40 MG PO TBEC
40.0000 mg | DELAYED_RELEASE_TABLET | Freq: Two times a day (BID) | ORAL | Status: DC
  Administered 2018-05-29 – 2018-05-30 (×3): 40 mg via ORAL
  Filled 2018-05-28 (×3): qty 1

## 2018-05-28 MED ORDER — GABAPENTIN 300 MG PO CAPS
600.0000 mg | ORAL_CAPSULE | Freq: Three times a day (TID) | ORAL | Status: DC
  Administered 2018-05-29 – 2018-05-30 (×5): 600 mg via ORAL
  Filled 2018-05-28 (×5): qty 2

## 2018-05-28 MED ORDER — ADULT MULTIVITAMIN W/MINERALS CH
1.0000 | ORAL_TABLET | Freq: Every day | ORAL | Status: DC
  Administered 2018-05-29 – 2018-05-30 (×2): 1 via ORAL
  Filled 2018-05-28 (×2): qty 1

## 2018-05-28 MED ORDER — ASPIRIN EC 81 MG PO TBEC
81.0000 mg | DELAYED_RELEASE_TABLET | Freq: Every day | ORAL | Status: DC
  Administered 2018-05-29 – 2018-05-30 (×2): 81 mg via ORAL
  Filled 2018-05-28 (×2): qty 1

## 2018-05-28 MED ORDER — ENSURE ENLIVE PO LIQD
237.0000 mL | Freq: Two times a day (BID) | ORAL | Status: DC
  Administered 2018-05-29 – 2018-05-30 (×4): 237 mL via ORAL

## 2018-05-28 MED ORDER — LEVOTHYROXINE SODIUM 112 MCG PO TABS
112.0000 ug | ORAL_TABLET | Freq: Every day | ORAL | Status: DC
  Administered 2018-05-29 – 2018-05-30 (×2): 112 ug via ORAL
  Filled 2018-05-28 (×2): qty 1

## 2018-05-28 MED ORDER — FUROSEMIDE 10 MG/ML IJ SOLN
40.0000 mg | Freq: Once | INTRAMUSCULAR | Status: AC
  Administered 2018-05-28: 40 mg via INTRAVENOUS
  Filled 2018-05-28: qty 4

## 2018-05-28 MED ORDER — FLUTICASONE PROPIONATE 50 MCG/ACT NA SUSP
2.0000 | Freq: Every day | NASAL | Status: DC
  Administered 2018-05-29: 2 via NASAL
  Filled 2018-05-28: qty 16

## 2018-05-28 MED ORDER — CALCIUM-PHOSPHORUS-VITAMIN D 200-96.6-200 MG-MG-UNIT PO CHEW: CHEWABLE_TABLET | Freq: Two times a day (BID) | ORAL | Status: DC

## 2018-05-28 MED ORDER — TRAMADOL HCL 50 MG PO TABS
50.0000 mg | ORAL_TABLET | Freq: Three times a day (TID) | ORAL | Status: DC | PRN
  Administered 2018-05-29: 50 mg via ORAL
  Filled 2018-05-28: qty 1

## 2018-05-28 MED ORDER — CITALOPRAM HYDROBROMIDE 20 MG PO TABS
10.0000 mg | ORAL_TABLET | Freq: Every day | ORAL | Status: DC
  Administered 2018-05-29: 10 mg via ORAL
  Filled 2018-05-28: qty 1

## 2018-05-28 MED ORDER — SODIUM CHLORIDE 0.9 % IV BOLUS
1000.0000 mL | Freq: Once | INTRAVENOUS | Status: AC
  Administered 2018-05-28: 1000 mL via INTRAVENOUS

## 2018-05-28 MED ORDER — RISAQUAD PO CAPS
1.0000 | ORAL_CAPSULE | Freq: Two times a day (BID) | ORAL | Status: DC
  Administered 2018-05-29 – 2018-05-30 (×3): 1 via ORAL
  Filled 2018-05-28 (×3): qty 1

## 2018-05-28 MED ORDER — DONEPEZIL HCL 5 MG PO TABS
10.0000 mg | ORAL_TABLET | Freq: Every day | ORAL | Status: DC
  Administered 2018-05-29: 10 mg via ORAL
  Filled 2018-05-28 (×3): qty 2

## 2018-05-28 MED ORDER — ACETAMINOPHEN 325 MG PO TABS
650.0000 mg | ORAL_TABLET | Freq: Four times a day (QID) | ORAL | Status: DC | PRN
  Administered 2018-05-29: 650 mg via ORAL
  Filled 2018-05-28: qty 2

## 2018-05-28 NOTE — ED Notes (Signed)
[facility] placed by this RN.  

## 2018-05-28 NOTE — ED Provider Notes (Signed)
[facility] Emergency Department Provider Note  Time seen: 1:36 PM  I have reviewed the triage vital signs and the nursing notes.   HISTORY  Chief Complaint Chest Pain    HPI Tina [name] is a 84 y.o. female with a past medical history of anxiety, dementia, renal insufficiency, recent admission for a fall with multiple rib fractures presents to the emergency department with continued pain and weakness.  According to family patient was discharged from the hospital yesterday.  They have home health aides at home who are helping care for the patient.  Patient has been very weak and is in significant amounts of pain with any attempted movement of the patient making it extremely difficult to help care for the patient at home.  Home health aide stated that this was above their level of care at home and the patient would need a rehab facility or skilled nursing facility so the family brought the patient back to the emergency department via EMS.  Here the patient denies any complaints but she has dementia per family.  Family states she will not act like she is in pain but if you attempt to move her or roll her in any bed she will scream in pain.  States the patient has become too weak to even stand or help with transfers.  They state they had to home health aides but they were not able to care for the patient given the degree of care required and inability to move the patient so they recommended she come here for placement.   Past Medical History:  Diagnosis Date  . Anxiety   . Cancer ([place])    breast  . Dementia ([place])   . Hypothyroidism   . Shingles     Patient Active Problem List   Diagnosis Date Noted  . Pressure injury of skin 05/24/2018  . Calculus of gallbladder without cholecystitis without obstruction   . Acute kidney injury ([place]) 05/23/2018  . Status post kyphoplasty 05/10/2018  . Wrist fracture, bilateral 03/23/2018  . Dementia in Alzheimer's disease ([place])  05/24/2017  . GI bleed 05/15/2017  . Acute blood loss anemia 05/15/2017  . Acute encephalopathy 05/15/2017  . Tachycardia 05/15/2017  . AMD (age related macular degeneration) 01/03/2015  . Allergic rhinitis 12/12/2013  . Depressive disorder, not elsewhere classified 12/12/2013  . Hypothyroidism 12/12/2013  . Postherpetic neuralgia 12/12/2013    Past Surgical History:  Procedure Laterality Date  . APPENDECTOMY    . BREAST SURGERY    . EYE SURGERY    . KYPHOPLASTY N/A 06/27/2015   Procedure: KYPHOPLASTY L4;  Surgeon: Hessie Knows, MD;  Location: [facility] ORS;  Service: Orthopedics;  Laterality: N/A;  . KYPHOPLASTY N/A 05/10/2018   Procedure: Manfred Arch;  Surgeon: Hessie Knows, MD;  Location: [facility] ORS;  Service: Orthopedics;  Laterality: N/A;  . ORIF WRIST FRACTURE Bilateral 03/24/2018   Procedure: OPEN REDUCTION INTERNAL FIXATION (ORIF) WRIST FRACTURE;  Surgeon: Hessie Knows, MD;  Location: [facility] ORS;  Service: Orthopedics;  Laterality: Bilateral;    Prior to Admission medications   Medication Sig Start Date End Date Taking? Authorizing Provider  acetaminophen (TYLENOL) 325 MG tablet Take 2 tablets (650 mg total) by mouth every 6 (six) hours as needed for mild pain (or Fever >/= 101). 05/18/17   Nicholes Mango, MD  alendronate (FOSAMAX) 70 MG tablet Take 70 mg by mouth every Sunday.  02/12/17   [provider]  aspirin EC 81 MG tablet Take 81 mg by mouth  daily.    [provider]  Calcium-Phosphorus-Vitamin D (CALCIUM/D3 ADULT GUMMIES PO) Take 2 tablets by mouth 2 (two) times daily. With lunch & supper    [provider]  citalopram (CELEXA) 10 MG tablet Take 10 mg by mouth daily.    [provider]  dicyclomine (BENTYL) 20 MG tablet Take 20 mg by mouth 4 (four) times daily.  03/09/18   [provider]  donepezil (ARICEPT) 10 MG tablet Take 10 mg by mouth at bedtime.     [provider]  feeding supplement, ENSURE ENLIVE, (ENSURE  ENLIVE) LIQD Take 237 mLs by mouth 2 (two) times daily between meals. 05/27/18   Saundra Shelling, MD  fluticasone (FLONASE) 50 MCG/ACT nasal spray Place 2 sprays into both nostrils at bedtime.     [provider]  gabapentin (NEURONTIN) 300 MG capsule Take 600 mg 3 (three) times daily by mouth.     [provider]  levothyroxine (SYNTHROID, LEVOTHROID) 112 MCG tablet Take 112 mcg by mouth daily before breakfast.     [provider]  meloxicam (MOBIC) 7.5 MG tablet Take 7.5 mg by mouth.    [provider]  Multiple Vitamin (MULTIVITAMIN WITH MINERALS) TABS tablet Take 1 tablet by mouth daily with lunch. Alive or Museum/gallery curator, Historical, MD  pantoprazole (PROTONIX) 40 MG tablet Take 1 tablet (40 mg total) by mouth 2 (two) times daily. 06/23/17   Vonda Antigua B, MD  Potassium (POTASSIMIN PO) Take 1 tablet by mouth at bedtime.    [provider]  Saccharomyces boulardii (PROBIOTIC) 250 MG CAPS Take 250 mg by mouth 2 (two) times daily.    [provider]    Allergies  Allergen Reactions  . Nsaids Other (See Comments)    H/O GI BLEED  . Lactose Intolerance (Gi)   . Meloxicam     Other reaction(s): Other (See Comments) Upper GI Bleeding    Family History  Problem Relation Age of Onset  . CAD Mother   . CAD Father     Social History Social History   Tobacco Use  . Smoking status: Current Every Day Smoker    Packs/day: 0.50  . Smokeless tobacco: Never Used  Substance Use Topics  . Alcohol use: No  . Drug use: No    Review of Systems Unable to obtain review of systems secondary to baseline dementia.  ____________________________________________   PHYSICAL EXAM:  VITAL SIGNS: ED Triage Vitals  Enc Vitals Group     BP 05/28/18 1148 92/70     Pulse Rate 05/28/18 1148 100     Resp --      Temp 05/28/18 1148 98 F (36.7 C)     Temp Source 05/28/18 1148 Oral     SpO2 05/28/18 1148 92 %     Weight --       Height --      Head Circumference --      Peak Flow --      Pain Score 05/28/18 1149 0     Pain Loc --      Pain Edu? --      Excl. in [place]? --    Constitutional: Alert and oriented. Well appearing and in no distress. Eyes: Normal exam ENT   Head: Normocephalic and atraumatic.   Mouth/Throat: Mucous membranes are moist. Cardiovascular: Normal rate, regular rhythm. No murmur Respiratory: Normal respiratory effort without tachypnea nor retractions. Breath sounds are clear  Gastrointestinal: Soft and nontender.  No distention.  Musculoskeletal: Nontender with normal range of motion in all extremities.  Neurologic:  Normal speech and language. No gross focal neurologic deficits Skin:  Skin is warm, dry and intact.  Psychiatric: Mood and affect are normal.   ____________________________________________    EKG  EKG viewed and interpreted by myself shows normal sinus rhythm at 99 bpm with a narrow QRS, normal axis, normal intervals, nonspecific ST changes. ____________________________________________    RADIOLOGY  Chest x-ray shows interstitial changes suggesting volume overload or early edema.  ____________________________________________   INITIAL IMPRESSION / ASSESSMENT AND PLAN / ED COURSE  Pertinent labs & imaging results that were available during my care of the patient were reviewed by me and considered in my medical decision making (see chart for details).  Patient presents to the emergency department for continued left chest pain, significant weakness, family unable to care for adequately at home.  Here the patient is calm, cooperative at this time.  Patient satting between 92 to 94% on room air.  We will recheck labs, chest x-ray and continue to closely monitor.  Family does not believe they can adequately care for the patient at home we will place a social work and PT consult peer the patient was recently hospitalized, which should help with placement.  Family does  not believe they could take the patient home due to her care needs and inability to help the patient even with the help of a home health aide.  Patient's chest x-ray appears to show early edema versus volume overload.  Patient did desat into the mid 42s with attempted ambulation with physical therapy which is extremely difficult and will recommend skilled nursing facility per PT.  Patient now desatted in the bed to 88% placed on nasal cannula oxygen.  We will dose Lasix and admit to the hospitalist service.  Ultimately patient will require placement.  ____________________________________________   FINAL CLINICAL IMPRESSION(S) / ED DIAGNOSES  Left chest wall pain Weakness Hypoxia   Harvest Dark, MD 05/28/18 1441

## 2018-05-28 NOTE — ED Triage Notes (Signed)
Pt to ED by EMS from home. Pt released yesterday from hospital after admission for multiple rib fx on left side. Family called EMS because unable to control pain at home. Family would like pt reevaluated.

## 2018-05-28 NOTE — H&P (Signed)
[place] [facility] [place] NAME: Tina [name]    MR#:  [id number]  DATE OF BIRTH:  11-Dec-1927  DATE OF ADMISSION:  05/28/2018  PRIMARY CARE PHYSICIAN: Derinda Late, MD   REQUESTING/REFERRING PHYSICIAN: Dr. Kerman Passey  CHIEF COMPLAINT:   Generalized weakness not able to walk and screaming with pain has dementia history is obtained from daughter, son and ER physician HISTORY OF PRESENT ILLNESS:  Tina [name]  is a 84 y.o. female with a known history of anxiety, dementia, renal insufficiency, hypothyroidism and recent fall with fractured rib fractures presented to the emergency room with continued pain and weakness. Patient was just discharge yesterday. Home health agency went to see patient and she was not able to get up without screaming from pain. Patient was just prescribed Tylenol. No other pain medicines were prescribed at discharge. She continued to remain week came to the emergency room was found to be hypoxic with sats in the 88%. Chest x-ray shows mild pulmonary vascular congestion. Patient received Lasix 40 mg times one. pt is being admitted for generalized weakness, deconditioning and mild pulmonary vascular congestion.  Physical therapy's outpatient in the ER and  they recommend rehab.  PAST MEDICAL HISTORY:   Past Medical History:  Diagnosis Date  . Anxiety   . Cancer ([place])    breast  . Dementia ([place])   . Hypothyroidism   . Shingles     PAST SURGICAL HISTOIRY:   Past Surgical History:  Procedure Laterality Date  . APPENDECTOMY    . BREAST SURGERY    . EYE SURGERY    . KYPHOPLASTY N/A 06/27/2015   Procedure: KYPHOPLASTY L4;  Surgeon: Hessie Knows, MD;  Location: [facility] ORS;  Service: Orthopedics;  Laterality: N/A;  . KYPHOPLASTY N/A 05/10/2018   Procedure: Manfred Arch;  Surgeon: Hessie Knows, MD;  Location: [facility] ORS;  Service: Orthopedics;  Laterality: N/A;  . ORIF WRIST FRACTURE Bilateral 03/24/2018   Procedure: OPEN REDUCTION INTERNAL FIXATION (ORIF) WRIST FRACTURE;  Surgeon: Hessie Knows, MD;  Location: [facility] ORS;  Service: Orthopedics;  Laterality: Bilateral;    SOCIAL HISTORY:   Social History   Tobacco Use  . Smoking status: Current Every Day Smoker    Packs/day: 0.50  . Smokeless tobacco: Never Used  Substance Use Topics  . Alcohol use: No    FAMILY HISTORY:   Family History  Problem Relation Age of Onset  . CAD Mother   . CAD Father     DRUG ALLERGIES:   Allergies  Allergen Reactions  . Nsaids Other (See Comments)    H/O GI BLEED  . Lactose Intolerance (Gi)   . Meloxicam     Other reaction(s): Other (See Comments) Upper GI Bleeding    REVIEW OF SYSTEMS:  Review of Systems  Unable to perform ROS: Dementia     MEDICATIONS AT HOME:   Prior to Admission medications   Medication Sig Start Date End Date Taking? Authorizing Provider  acetaminophen (TYLENOL) 325 MG tablet Take 2 tablets (650 mg total) by mouth every 6 (six) hours as needed for mild pain (or Fever >/= 101). 05/18/17  Yes Gouru, Illene Silver, MD  alendronate (FOSAMAX) 70 MG tablet Take 70 mg by mouth every Sunday.  02/12/17  Yes [provider]  aspirin EC 81 MG tablet Take 81 mg by mouth daily.   Yes [provider]  Calcium-Phosphorus-Vitamin D (CALCIUM/D3 ADULT GUMMIES PO) Take 2 tablets by mouth 2 (two) times daily. With lunch & supper  Yes [provider]  citalopram (CELEXA) 10 MG tablet Take 10 mg by mouth daily.   Yes [provider]  dicyclomine (BENTYL) 20 MG tablet Take 20 mg by mouth 4 (four) times daily.  03/09/18  Yes [provider]  donepezil (ARICEPT) 10 MG tablet Take 10 mg by mouth at bedtime.    Yes [provider]  fluticasone (FLONASE) 50 MCG/ACT nasal spray Place 2 sprays into both nostrils at bedtime.    Yes [provider]  gabapentin (NEURONTIN) 300 MG capsule Take 600 mg 3 (three) times daily by mouth.    Yes  [provider]  levothyroxine (SYNTHROID, LEVOTHROID) 112 MCG tablet Take 112 mcg by mouth daily before breakfast.    Yes [provider]  Multiple Vitamin (MULTIVITAMIN WITH MINERALS) TABS tablet Take 1 tablet by mouth daily with lunch. Alive or Centrum Silver   Yes [provider]  pantoprazole (PROTONIX) 40 MG tablet Take 1 tablet (40 mg total) by mouth 2 (two) times daily. 06/23/17  Yes Vonda Antigua B, MD  Potassium (POTASSIMIN PO) Take 1 tablet by mouth at bedtime.   Yes [provider]  Saccharomyces boulardii (PROBIOTIC) 250 MG CAPS Take 250 mg by mouth 2 (two) times daily.   Yes [provider]  feeding supplement, ENSURE ENLIVE, (ENSURE ENLIVE) LIQD Take 237 mLs by mouth 2 (two) times daily between meals. 05/27/18   Saundra Shelling, MD      VITAL SIGNS:  Blood pressure 99/74, pulse 100, temperature 98 F (36.7 C), temperature source Oral, SpO2 92 %.  PHYSICAL EXAMINATION:  GENERAL:  84 y.o.-year-old patient lying in the bed with no acute distress.  EYES: Pupils equal, round, reactive to light and accommodation. No scleral icterus. Extraocular muscles intact.  HEENT: Head atraumatic, normocephalic. Oropharynx and nasopharynx clear.  NECK:  Supple, no jugular venous distention. No thyroid enlargement, no tenderness.  LUNGS: Normal breath sounds bilaterally, no wheezing, rales,rhonchi or crepitation. No use of accessory muscles of respiration.  CARDIOVASCULAR: S1, S2 normal. No murmurs, rubs, or gallops.  ABDOMEN: Soft, nontender, nondistended. Bowel sounds present. No organomegaly or mass.  EXTREMITIES: No pedal edema, cyanosis, or clubbing.  NEUROLOGIC: Cranial nerves II through XII are intact. As all extremities well. She appears very weak.  PSYCHIATRIC: The patient is alert and confused has dementia SKIN: No obvious rash, lesion, or ulcer.   LABORATORY PANEL:   CBC Recent Labs  Lab 05/28/18 1249  WBC 11.8*  HGB 10.5*  HCT  31.9*  PLT 235   ------------------------------------------------------------------------------------------------------------------  Chemistries  Recent Labs  Lab 05/28/18 1249  NA 136  K 4.9  CL 104  CO2 26  GLUCOSE 95  BUN 20  CREATININE 0.78  CALCIUM 8.6*  AST 45*  ALT 22  ALKPHOS 281*  BILITOT 0.9   ------------------------------------------------------------------------------------------------------------------  Cardiac Enzymes Recent Labs  Lab 05/28/18 1249  TROPONINI <0.03   ------------------------------------------------------------------------------------------------------------------  RADIOLOGY:  Dg Chest 2 View  Result Date: 05/28/2018 CLINICAL DATA:  Left acute rib fracture by CT EXAM: CHEST - 2 VIEW COMPARISON:  05/23/2018 FINDINGS: Left ninth and tenth rib fractures are better appreciated by CT comparison. Low lung volumes persist with mild increased interstitial changes, suggesting early volume overload or developing edema. No enlarging effusion or pneumothorax. Trachea is midline. Aorta atherosclerotic. Degenerative changes of the spine. Remote lower thoracic vertebral augmentation. IMPRESSION: Persistent low lung volumes with increased diffuse interstitial changes suggesting volume overload or early edema. Left ninth and tenth rib fractures are not  appreciated by plain radiography. No significant enlarging effusion or pneumothorax Electronically Signed   By: Jerilynn Mages.  Shick M.D.   On: 05/28/2018 13:09    EKG:  's rhythm  IMPRESSION AND PLAN:   Tina [name]  is a 84 y.o. female with a known history of anxiety, dementia, renal insufficiency, hypothyroidism and recent fall with fractured rib fractures presented to the emergency room with continued pain and weakness. Patient was just discharge yesterday. Home health agency went to see patient and she was not able to get up without screaming from pain.  1. generalized weakness/deconditioning from recent fall with  rib fractures -Tylenol for mild and moderate pain -Ultram for moderate to severe pain -consider low-dose OxyContin if patient still complains of pain despite above regimen -discussed with son and daughter -physical therapy recommends rehab--- did mention to family that patient will need to do rehab for her to stay at the facility. -social worker for discharge planning  2. transient hypoxia with mild pulmonary vascular congestion suspected due to volume overload with recent dehydration and received IV fluids last admission -patient received IV Lasix 40 mg times one in the ER -wean her off oxygen -incentive spirometer  3. Dementia  4. Hypothyroidism continue Synthroid  5. D prophylaxis subcu Lovenox  Palliative care consultation  Discussed with daughter and son    All the records are reviewed and case discussed with ED provider. Management plans discussed with the patient, family and they are in agreement.  CODE STATUS: full  TOTAL TIME TAKING CARE OF THIS PATIENT: *50* minutes.    Fritzi Mandes M.D on 05/28/2018 at 3:30 PM  Between 7am to 6pm - Pager - [phone]  After 6pm go to [email or web address] - password [email or web address]  [facility]  Office  [phone]  CC: Primary care physician; Derinda Late, MD

## 2018-05-28 NOTE — Progress Notes (Signed)
Report called from [facility]

## 2018-05-28 NOTE — Progress Notes (Signed)
PHARMACIST - PHYSICIAN ORDER COMMUNICATION  CONCERNING: P&T Medication Policy on Herbal Medications  DESCRIPTION:  This patient's order for:  Calcium-Phosphorus-Vitamin D 200-96.6-200 MG-MG-UNIT CHEW  has been noted.  This product(s) is classified as an "herbal" or natural product. Due to a lack of definitive safety studies or FDA approval, nonstandard manufacturing practices, plus the potential risk of unknown drug-drug interactions while on inpatient medications, the Pharmacy and Therapeutics Committee does not permit the use of "herbal" or natural products of this type within [facility].   ACTION TAKEN: The pharmacy department is unable to verify this order at this time. Please reevaluate patient's clinical condition at discharge and address if the herbal or natural product(s) should be resumed at that time.

## 2018-05-28 NOTE — Evaluation (Signed)
Physical Therapy Evaluation Patient Details Name: Tina [name] MRN: [id number] DOB: Jan 14, 1928 Today's Date: 05/28/2018   History of Present Illness  84 y/o female returned to ED for uncontrolled rib pain and difficulty with mobility following discharge home yesterday s/p fall with R rib fx. She has had numerous falls recently.  In September she suffered B wrist fractures with R radial surgical repair and ulnar fracture nondisplaced; L radial surgically repaired impacted fracture. Since that time she has had a recent kyphoplasty ~2 weeks ago.   PMHx:  anxiety, kyphoplasty, shingles, dementia, macular degeneration, PVD  Clinical Impression  Patient is an 84 year old female who lives in a one story home alone.  She was discharged from the hospital yesterday and was unable to transfer with assistance from NA's.  She reported 4/10 pain in ribs at rest and 6/10 pain with mobility. Pt requires mod A +2 for bed mobility and min A for STS.  She was able to walk 5 ft with RW but with labored effort and experienced O2 desat and tachycardia.  Pt required 2 min to recover to >90% O2 and presented with wheezing during inhalation.  RN made aware.  Pt will continue to benefit from skilled PT with focus on strength, pain management, tolerance to activity and safe functional mobility.  Due to decreased mobility and concern for caregiver assistance, pt will benefit from SNF placement following discharge.    Follow Up Recommendations SNF;Supervision/Assistance - 24 hour    Equipment Recommendations  None recommended by PT    Recommendations for Other Services       Precautions / Restrictions Precautions Precautions: Fall Restrictions Weight Bearing Restrictions: No      Mobility  Bed Mobility Overal bed mobility: Needs Assistance Bed Mobility: Supine to Sit;Sit to Supine     Supine to sit: HOB elevated;Mod assist;+2 for physical assistance Sit to supine: Mod assist;+2 for physical assistance    General bed mobility comments: Assist to elevate trunk with use of bed rail and assist from PT.  Mod assist from PT and pt's son to bring LEs into bed.    Transfers Overall transfer level: Needs assistance Equipment used: Rolling walker (2 wheeled) Transfers: Sit to/from Stand Sit to Stand: From elevated surface;Min assist         General transfer comment: Cues for hand placement and pt very slow to come to standing, requiring mod assist to boost to standing.   Ambulation/Gait Ambulation/Gait assistance: Min assist Gait Distance (Feet): 5 Feet       Gait velocity interpretation: <1.31 ft/sec, indicative of household ambulator General Gait Details: Short shuffling steps, kyphotic posture pushing RW anteriorly.  VC's required for sequencing turns with RW.  Pt became tachycardic and hypoxic.  REquired 2 min to recover to >90% O2 sats.  Stairs            Wheelchair Mobility    Modified Rankin (Stroke Patients Only)       Balance Overall balance assessment: Needs assistance Sitting-balance support: No upper extremity supported Sitting balance-Leahy Scale: Fair Sitting balance - Comments: Pt able to maintain static sitting at EOB with UE assist   Standing balance support: No upper extremity supported Standing balance-Leahy Scale: Poor                               Pertinent Vitals/Pain Pain Assessment: 0-10 Pain Score: 6  Pain Location: L torso area over rib fractures. Pain  Intervention(s): Monitored during session;Limited activity within patient's tolerance    Home Living Family/patient expects to be discharged to:: Skilled nursing facility Living Arrangements: Alone Available Help at Discharge: Family;Available PRN/intermittently(apparently son can only consistently help on weekends) Type of Home: House Home Access: Stairs to enter   [employer] of Steps: 2 Home Layout: One level Home Equipment: Walker - 2 wheels;Grab bars -  tub/shower;Shower seat Additional Comments: gathered from previous notes, pt not consistent with answering questions    Prior Function Level of Independence: Needs assistance   Gait / Transfers Assistance Needed: 4WW in the home           Hand Dominance   Dominant Hand: Right    Extremity/Trunk Assessment   Upper Extremity Assessment Upper Extremity Assessment: Generalized weakness    Lower Extremity Assessment Lower Extremity Assessment: Generalized weakness    Cervical / Trunk Assessment Cervical / Trunk Assessment: Kyphotic  Communication   Communication: HOH  Cognition Arousal/Alertness: Awake/alert Behavior During Therapy: WFL for tasks assessed/performed Overall Cognitive Status: History of cognitive impairments - at baseline                                 General Comments: Pt alert and oriented to self      General Comments      Exercises Other Exercises Other Exercises: Instructed pt in pursed lip breathing while sitting EOB x1 min   Assessment/Plan    PT Assessment Patient needs continued PT services  PT Problem List Decreased strength;Decreased cognition;Decreased activity tolerance;Decreased balance;Decreased mobility;Pain       PT Treatment Interventions DME instruction;Balance training;Gait training;Cognitive remediation;Functional mobility training;Patient/family education;Therapeutic activities;Therapeutic exercise    PT Goals (Current goals can be found in the Care Plan section)  Acute Rehab PT Goals PT Goal Formulation: Patient unable to participate in goal setting    Frequency Min 2X/week   Barriers to discharge        Co-evaluation               AM-PAC PT "6 Clicks" Mobility  Outcome Measure Help needed turning from your back to your side while in a flat bed without using bedrails?: A Lot Help needed moving from lying on your back to sitting on the side of a flat bed without using bedrails?: A Lot Help  needed moving to and from a bed to a chair (including a wheelchair)?: A Lot Help needed standing up from a chair using your arms (e.g., wheelchair or bedside chair)?: A Lot Help needed to walk in hospital room?: A Lot Help needed climbing 3-5 steps with a railing? : A Lot 6 Click Score: 12    End of Session Equipment Utilized During Treatment: Gait belt Activity Tolerance: Treatment limited secondary to medical complications (Comment);Patient limited by fatigue(elevated HR, O2 desat) Patient left: with call bell/phone within reach;in bed;with bed alarm set;with family/visitor present Nurse Communication: Mobility status;Other (comment)(vitals) PT Visit Diagnosis: Other abnormalities of gait and mobility (R26.89);Difficulty in walking, not elsewhere classified (R26.2);Muscle weakness (generalized) (M62.81);Pain Pain - Right/Left: Left Pain - part of body: (ribcage)    Time: 8032-1224 PT Time Calculation (min) (ACUTE ONLY): 21 min   Charges:   PT Evaluation $PT Eval Moderate Complexity: 1 Mod          Roxanne Gates, PT, DPT   Roxanne Gates 05/28/2018, 2:53 PM

## 2018-05-28 NOTE — Progress Notes (Signed)
Family Meeting Note  Advance Directive yes Today a meeting took place with the son and dtrer  Patient readmitted with increasing generalized weakness deconditioning and significant pain. She is advanced dementia. She was discharged home yesterday after rib fracture and dehydration came in which shortness of breath and generalized weakness found to have some pulmonary congestion. Discuss code status with son who weeks tells me they want her to be full code because if patient is outside the hospital his notion is if patient would be DNR they would not take care of her and hence they want her to be full code.  I did review to reiterate that is not the issue how were he was fixated with the above notion. Will get palliative care involved.  Patient will remain a full code for now. Time spent during discussion 16 mins  Fritzi Mandes, MD

## 2018-05-28 NOTE — ED Notes (Signed)
Pt states no pain except with movement.

## 2018-05-29 ENCOUNTER — Other Ambulatory Visit: Payer: Self-pay

## 2018-05-29 MED ORDER — ENOXAPARIN SODIUM 40 MG/0.4ML ~~LOC~~ SOLN
40.0000 mg | SUBCUTANEOUS | Status: DC
  Administered 2018-05-29 – 2018-05-30 (×2): 40 mg via SUBCUTANEOUS
  Filled 2018-05-29 (×2): qty 0.4

## 2018-05-29 MED ORDER — FUROSEMIDE 40 MG PO TABS
40.0000 mg | ORAL_TABLET | Freq: Once | ORAL | Status: AC
  Administered 2018-05-29: 40 mg via ORAL
  Filled 2018-05-29: qty 1

## 2018-05-29 NOTE — Progress Notes (Signed)
Paged MD to request diet order for patient.  Informed on coming RN of call/page

## 2018-05-29 NOTE — NC FL2 (Signed)
[place] LEVEL OF CARE SCREENING TOOL     IDENTIFICATION  Patient Name: Tina [name] Birthdate: September 09, 1927 Sex: female Admission Date (Current Location): 05/28/2018  [place] and [place] Number:  Engineering geologist and Address:  [facility], [address] [place], [place] [address]      Provider Number: [id number]  Attending Physician Name and Address:  Hillary Bow, MD  Relative Name and Phone Number:  Allyiah Gartner [facility]) [phone] or Lucio Edward (Daughter) [phone]    Current Level of Care: Hospital Recommended Level of Care: [place] Prior Approval Number:    Date Approved/Denied:   PASRR Number: [id number]  Discharge Plan: SNF    Current Diagnoses: Patient Active Problem List   Diagnosis Date Noted  . Hypoxia 05/28/2018  . Pressure injury of skin 05/24/2018  . Calculus of gallbladder without cholecystitis without obstruction   . Acute kidney injury ([place]) 05/23/2018  . Status post kyphoplasty 05/10/2018  . Wrist fracture, bilateral 03/23/2018  . Dementia in Alzheimer's disease ([place]) 05/24/2017  . GI bleed 05/15/2017  . Acute blood loss anemia 05/15/2017  . Acute encephalopathy 05/15/2017  . Tachycardia 05/15/2017  . AMD (age related macular degeneration) 01/03/2015  . Allergic rhinitis 12/12/2013  . Depressive disorder, not elsewhere classified 12/12/2013  . Hypothyroidism 12/12/2013  . Postherpetic neuralgia 12/12/2013    Orientation RESPIRATION BLADDER Height & Weight     Self  Normal Incontinent Weight: 151 lb 7.3 oz (68.7 kg) Height:     BEHAVIORAL SYMPTOMS/MOOD NEUROLOGICAL BOWEL NUTRITION STATUS      Continent Diet(Regular)  AMBULATORY STATUS COMMUNICATION OF NEEDS Skin   Extensive Assist Verbally Normal                       Personal Care Assistance Level of Assistance  Bathing, Feeding, Dressing Bathing Assistance: Maximum assistance Feeding assistance: Limited  assistance Dressing Assistance: Maximum assistance     Functional Limitations Info  Sight, Hearing, Speech Sight Info: Adequate Hearing Info: Adequate Speech Info: Adequate    SPECIAL CARE FACTORS FREQUENCY  PT (By licensed PT), OT (By licensed OT)     PT Frequency: Up to 5X per week OT Frequency: Up to 3X per week            Contractures Contractures Info: Not present    Additional Factors Info  Code Status Code Status Info: Full Allergies Info:  Nsaids, Lactose Intolerance (Gi), Meloxicam           Current Medications (05/29/2018):  This is the current hospital active medication list Current Facility-Administered Medications  Medication Dose Route Frequency Provider Last Rate Last Dose  . acetaminophen (TYLENOL) tablet 650 mg  650 mg Oral Q6H PRN Fritzi Mandes, MD      . acidophilus (RISAQUAD) capsule 1 capsule  1 capsule Oral BID Fritzi Mandes, MD   1 capsule at 05/29/18 0909  . aspirin EC tablet 81 mg  81 mg Oral Daily Fritzi Mandes, MD   81 mg at 05/29/18 0909  . citalopram (CELEXA) tablet 10 mg  10 mg Oral QHS Fritzi Mandes, MD      . dicyclomine (BENTYL) tablet 20 mg  20 mg Oral QID Fritzi Mandes, MD   20 mg at 05/29/18 1249  . donepezil (ARICEPT) tablet 10 mg  10 mg Oral QHS Fritzi Mandes, MD      . enoxaparin (LOVENOX) injection 40 mg  40 mg Subcutaneous Q24H Hillary Bow, MD   40  mg at 05/29/18 1247  . feeding supplement (ENSURE ENLIVE) (ENSURE ENLIVE) liquid 237 mL  237 mL Oral BID BM Fritzi Mandes, MD   237 mL at 05/29/18 1248  . fluticasone (FLONASE) 50 MCG/ACT nasal spray 2 spray  2 spray Each Nare QHS Fritzi Mandes, MD      . gabapentin (NEURONTIN) capsule 600 mg  600 mg Oral TID Fritzi Mandes, MD   600 mg at 05/29/18 0909  . levothyroxine (SYNTHROID, LEVOTHROID) tablet 112 mcg  112 mcg Oral QAC breakfast Fritzi Mandes, MD   112 mcg at 05/29/18 0908  . multivitamin with minerals tablet 1 tablet  1 tablet Oral Q lunch Fritzi Mandes, MD   1 tablet at 05/29/18 1247  .  pantoprazole (PROTONIX) EC tablet 40 mg  40 mg Oral BID Fritzi Mandes, MD   40 mg at 05/29/18 0909  . traMADol (ULTRAM) tablet 50 mg  50 mg Oral Q8H PRN Fritzi Mandes, MD   50 mg at 05/29/18 1248     Discharge Medications: Please see discharge summary for a list of discharge medications.  Relevant Imaging Results:  Relevant Lab Results:   Additional Information SS#[id number]  Zettie Pho, LCSW

## 2018-05-29 NOTE — Progress Notes (Signed)
   05/29/18 1320  Clinical Encounter Type  Visited With Patient  Visit Type Initial  Referral From Nurse  Consult/Referral To Chaplain  Spiritual Encounters  Spiritual Needs Prayer;Emotional  Pt was resting upon arrival but woke up to verbal stimulation. Was oriented to self and others. [place] introduced self. Patient was pleasant but lethargic. Did not disclose feelings but stretched out hand and requested prayer. [place] provided pastoral care through prayer. Was a non-anxious hopeful presence. Pastoral visit was appreciated.

## 2018-05-29 NOTE — Clinical Social Work Note (Signed)
Clinical Social Work Assessment  Patient Details  Name: Tina [name] MRN: [id number] Date of Birth: [birth year]  Date of referral:  05/29/18               Reason for consult:  Facility Placement                Permission sought to share information with:  Facility Art therapist granted to share information::  Yes, Verbal Permission Granted  Name::        Agency::  [place] area SNFs except for [facility], [facility], or [place].  Relationship::     Contact Information:     Housing/Transportation Living arrangements for the past 2 months:  [place] of Information:  Patient, Medical Team, Adult Children Patient Interpreter Needed:  None Criminal Activity/Legal Involvement Pertinent to Current Situation/Hospitalization:  No - Comment as needed Significant Relationships:  Adult Children Lives with:  Self Do you feel safe going back to the place where you live?  Yes Need for family participation in patient care:  Yes (Comment)(Patient has dementia and is currently confused)  Care giving concerns:  PT recommendation for SNF; patient readmitted in fewer than 7 days; patient has 3 inpatient admissions in 3 months   Social Worker assessment / plan:  The CSW met with the patient and her son at bedside to discuss discharge planning. The CSW introduced self and role in care. The patient's son asked if he could call his sister so that she could listen in and comment on the discussion. The patient's family discussed the ongoing care barriers since the patient's first fall in September and their concern for her safety in the home. The patient's daughter lives in [place], and her son lives in [place]. The patient's children are not physically able to care for the patient, and they had hired 24hr sitters at the last admission; however, the sitters refused to return after one night with no explanation. The CSW provided emotional support and  discussion of caregiver stress and caregiver guilt. The CSW provided psychoeducation about the effects of caregiver stress on all realms of health for both the caregivers and the patient. The family thanked the CSW for information.  After the patient's daughter discontinued the call, the patient's son asked about palliative care. The CSW explained the different levels of care within palliative care including outpatient palliative, home with hospice, and hospice at a facility. The patient's son is interested in an outpatient palliative care referral to begin the long term goal discussion.  The patient's family are in agreement with transfer to SNF with the plan for return home once the patient's pain is managed. The patient's family prefer [facility] and refused referral for [place], or [facility]. The CSW will follow up with bed offers in the morning. Of note, the patient has already had a 3 midnight qualifying inpatient medical admission in the past 30 days.  Employment status:  Retired Forensic scientist:  [employer] PT Recommendations:  [place] / Referral to community resources:  [place]  Patient/Family's Response to care:  The patient was lethargic. The patient's family thanked the CSW.  Patient/Family's Understanding of and Emotional Response to Diagnosis, Current Treatment, and Prognosis:  The patient's family seem to understand that their mother is in need of a higher level of care, at least for short term, and they are beginning to recognize that long term care goals  need to be discussed. They are open to the idea of palliative care.   Emotional Assessment Appearance:  Appears stated age Attitude/Demeanor/Rapport:  Lethargic Affect (typically observed):  Stable Orientation:  Oriented to Self, Oriented to Place Alcohol / Substance use:  Never Used Psych involvement (Current and /or in the  community):  No (Comment)  Discharge Needs  Concerns to be addressed:  Care Coordination, Discharge Planning Concerns, Other (Comment Required(Caregiver support) Readmission within the last 30 days:  Yes Current discharge risk:  Chronically ill, Physical Impairment, Lives alone Barriers to Discharge:  Continued Medical Work up   [employer], LCSW 05/29/2018, 1:03 PM

## 2018-05-29 NOTE — Progress Notes (Signed)
Tina [name] at [place] NAME: Tina [name]    MR#:  [id number]  DATE OF BIRTH:  07-10-1927  SUBJECTIVE:  CHIEF COMPLAINT:   Chief Complaint  Patient presents with  . Chest Pain   Pain when she moves  Son at bedside  REVIEW OF SYSTEMS:    Review of Systems  Unable to perform ROS: Dementia   DRUG ALLERGIES:   Allergies  Allergen Reactions  . Nsaids Other (See Comments)    H/O GI BLEED  . Lactose Intolerance (Gi)   . Meloxicam     Other reaction(s): Other (See Comments) Upper GI Bleeding    VITALS:  Blood pressure 104/61, pulse (!) 106, temperature 98 F (36.7 C), resp. rate 20, weight 68.7 kg, SpO2 95 %.  PHYSICAL EXAMINATION:   Physical Exam  GENERAL:  84 y.o.-year-old patient lying in the bed with no acute distress.  EYES: Pupils equal, round, reactive to light and accommodation. No scleral icterus. Extraocular muscles intact.  HEENT: Head atraumatic, normocephalic. Oropharynx and nasopharynx clear.  NECK:  Supple, no jugular venous distention. No thyroid enlargement, no tenderness.  LUNGS: Normal breath sounds bilaterally, no wheezing, rales, rhonchi. No use of accessory muscles of respiration.  CARDIOVASCULAR: S1, S2 normal. No murmurs, rubs, or gallops.  ABDOMEN: Soft, nontender, nondistended. Bowel sounds present. No organomegaly or mass.  EXTREMITIES: No cyanosis, clubbing. Lower ext edema NEUROLOGIC: Moves all 4 extremities  PSYCHIATRIC: The patient is alert and awake. confused SKIN: No obvious rash, lesion, or ulcer.   LABORATORY PANEL:   CBC Recent Labs  Lab 05/28/18 1249  WBC 11.8*  HGB 10.5*  HCT 31.9*  PLT 235   ------------------------------------------------------------------------------------------------------------------ Chemistries  Recent Labs  Lab 05/28/18 1249  NA 136  K 4.9  CL 104  CO2 26  GLUCOSE 95  BUN 20  CREATININE 0.78  CALCIUM 8.6*  AST 45*  ALT 22  ALKPHOS 281*  BILITOT  0.9   ------------------------------------------------------------------------------------------------------------------  Cardiac Enzymes Recent Labs  Lab 05/28/18 1249  TROPONINI <0.03   ------------------------------------------------------------------------------------------------------------------  RADIOLOGY:  Dg Chest 2 View  Result Date: 05/28/2018 CLINICAL DATA:  Left acute rib fracture by CT EXAM: CHEST - 2 VIEW COMPARISON:  05/23/2018 FINDINGS: Left ninth and tenth rib fractures are better appreciated by CT comparison. Low lung volumes persist with mild increased interstitial changes, suggesting early volume overload or developing edema. No enlarging effusion or pneumothorax. Trachea is midline. Aorta atherosclerotic. Degenerative changes of the spine. Remote lower thoracic vertebral augmentation. IMPRESSION: Persistent low lung volumes with increased diffuse interstitial changes suggesting volume overload or early edema. Left ninth and tenth rib fractures are not appreciated by plain radiography. No significant enlarging effusion or pneumothorax Electronically Signed   By: Jerilynn Mages.  Shick M.D.   On: 05/28/2018 13:09     ASSESSMENT AND PLAN:   Tina [name]  is a 84 y.o. female with a known history of anxiety, dementia, renal insufficiency, hypothyroidism and recent fall with fractured rib fractures presented to the emergency room with continued pain and weakness. Patient was just discharge 11/29. Home health agency went to see patient and she was not able to get up without screaming from pain. Sent to ED  1. Generalized weakness/deconditioning from recent fall with rib fractures -Tylenol + Ultram -discussed with son today -physical therapy recommends rehab -social worker for discharge planning  2. Transient hypoxia with mild pulmonary vascular congestion suspected due to volume overload with recent dehydration and received IV fluids last admission -  patient received IV Lasix 40 mg  times one in the ER Will give one more dose lasix today  3. Dementia Monitor for inpatient delieirum  4. Hypothyroidism continue Synthroid  5. DVT prophylaxis  Lovenox  All the records are reviewed and case discussed with Care Management/Social Worker Management plans discussed with the patient, family and they are in agreement.  CODE STATUS: FULL CODE  DVT Prophylaxis: SCDs  TOTAL TIME TAKING CARE OF THIS PATIENT: 40 minutes.  >50% time spent on discussing with family at bedside  POSSIBLE D/C IN 1-2 DAYS, DEPENDING ON CLINICAL CONDITION.  Leia Alf [name] M.D on 05/29/2018 at 1:48 PM  Between 7am to 6pm - Pager - [phone]  After 6pm go to [email or web address] - password [email or web address] [facility]  Office  [phone]  CC: Primary care physician; Derinda Late, MD  Note: This dictation was prepared with Dragon dictation along with smaller phrase technology. Any transcriptional errors that result from this process are unintentional.

## 2018-05-30 MED ORDER — TRAMADOL HCL 50 MG PO TABS: 50.0000 mg | ORAL_TABLET | Freq: Three times a day (TID) | ORAL | 0 refills | Status: AC | PRN

## 2018-05-30 NOTE — Discharge Summary (Signed)
[facility] NAME: Tina [name]    MR#:  [id number]  DATE OF BIRTH:  14-May-1928  DATE OF ADMISSION:  05/28/2018 ADMITTING PHYSICIAN: Fritzi Mandes, MD  DATE OF DISCHARGE: No discharge date for patient encounter.  PRIMARY CARE PHYSICIAN: Derinda Late, MD   ADMISSION DIAGNOSIS:  Chest wall pain [R07.89] SOB (shortness of breath) [R06.02] Hypoxia [R09.02]  DISCHARGE DIAGNOSIS:  Active Problems:   Hypoxia   SECONDARY DIAGNOSIS:   Past Medical History:  Diagnosis Date  . Anxiety   . Cancer ([place])    breast  . Dementia ([place])   . Hypothyroidism   . Shingles      ADMITTING HISTORY  HISTORY OF PRESENT ILLNESS:  Tina [name]  is a 84 y.o. female with a known history of anxiety, dementia, renal insufficiency, hypothyroidism and recent fall with fractured rib fractures presented to the emergency room with continued pain and weakness. Patient was just discharge yesterday. Home health agency went to see patient and she was not able to get up without screaming from pain. Patient was just prescribed Tylenol. No other pain medicines were prescribed at discharge. She continued to remain week came to the emergency room was found to be hypoxic with sats in the 88%. Chest x-ray shows mild pulmonary vascular congestion. Patient received Lasix 40 mg times one. pt is being admitted for generalized weakness, deconditioning and mild pulmonary vascular congestion.  Physical therapy's outpatient in the ER and  they recommend rehab.   HOSPITAL COURSE:   Tina [name] a27 y.o.femalewith a known history of anxiety, dementia, renal insufficiency, hypothyroidism and recent fall with fractured rib fractures presented to the emergency room with continued pain and weakness. Patient was just discharge 11/29. Home health agency went to see patient and she was not able to get up without screaming from pain. Sent to ED  1.Generalized  weakness/deconditioning from recent fall with rib fractures -Tylenol + Ultram -discussed with son  -physical therapy recommends rehab -Medically stable to be discharged to skilled nursing facility Pain well controlled  2.Transient hypoxia with mild pulmonary vascular congestion suspected due to volume overload with recent dehydration and received IV fluids last admission Also atelectasis. Resolved  3.Dementia Stable  4.Hypothyroidism continue Synthroid  5.DVT prophylaxis  Lovenox in the hospital  Patient will need palliative care following at the rehab  CONSULTS OBTAINED:    DRUG ALLERGIES:   Allergies  Allergen Reactions  . Nsaids Other (See Comments)    H/O GI BLEED  . Lactose Intolerance (Gi)   . Meloxicam     Other reaction(s): Other (See Comments) Upper GI Bleeding    DISCHARGE MEDICATIONS:   Allergies as of 05/30/2018      Reactions   Nsaids Other (See Comments)   H/O GI BLEED   Lactose Intolerance (gi)    Meloxicam    Other reaction(s): Other (See Comments) Upper GI Bleeding      Medication List    TAKE these medications   acetaminophen 325 MG tablet Commonly known as:  TYLENOL Take 2 tablets (650 mg total) by mouth every 6 (six) hours as needed for mild pain (or Fever >/= 101).   alendronate 70 MG tablet Commonly known as:  FOSAMAX Take 70 mg by mouth every Sunday.   aspirin EC 81 MG tablet Take 81 mg by mouth daily.   CALCIUM/D3 ADULT GUMMIES PO Take 2 tablets by mouth 2 (two) times daily. With lunch & supper   citalopram 10 MG tablet  Commonly known as:  CELEXA Take 10 mg by mouth daily.   dicyclomine 20 MG tablet Commonly known as:  BENTYL Take 20 mg by mouth 4 (four) times daily.   donepezil 10 MG tablet Commonly known as:  ARICEPT Take 10 mg by mouth at bedtime.   feeding supplement (ENSURE ENLIVE) Liqd Take 237 mLs by mouth 2 (two) times daily between meals.   fluticasone 50 MCG/ACT nasal spray Commonly known as:   FLONASE Place 2 sprays into both nostrils at bedtime.   gabapentin 300 MG capsule Commonly known as:  NEURONTIN Take 600 mg 3 (three) times daily by mouth.   levothyroxine 112 MCG tablet Commonly known as:  SYNTHROID, LEVOTHROID Take 112 mcg by mouth daily before breakfast.   multivitamin with minerals Tabs tablet Take 1 tablet by mouth daily with lunch. Alive or Centrum Silver   pantoprazole 40 MG tablet Commonly known as:  PROTONIX Take 1 tablet (40 mg total) by mouth 2 (two) times daily.   POTASSIMIN PO Take 1 tablet by mouth at bedtime.   Probiotic 250 MG Caps Take 250 mg by mouth 2 (two) times daily.   traMADol 50 MG tablet Commonly known as:  ULTRAM Take 1 tablet (50 mg total) by mouth every 8 (eight) hours as needed for moderate pain or severe pain.       Today   VITAL SIGNS:  Blood pressure 103/63, pulse 93, temperature 97.8 F (36.6 C), resp. rate 16, height 5' (1.524 m), weight 68.7 kg, SpO2 95 %.  I/O:    Intake/Output Summary (Last 24 hours) at 05/30/2018 1119 Last data filed at 05/30/2018 1028 Gross per 24 hour  Intake 120 ml  Output 200 ml  Net -80 ml    PHYSICAL EXAMINATION:  Physical Exam  GENERAL:  84 y.o.-year-old patient lying in the bed with no acute distress.  LUNGS: Normal breath sounds bilaterally, no wheezing, rales,rhonchi or crepitation. No use of accessory muscles of respiration.  CARDIOVASCULAR: S1, S2 normal. No murmurs, rubs, or gallops.  ABDOMEN: Soft, non-tender, non-distended. Bowel sounds present. No organomegaly or mass.  NEUROLOGIC: Moves all 4 extremities. PSYCHIATRIC: The patient is alert and awake. Pleasantly confused SKIN: No obvious rash, lesion, or ulcer.   DATA REVIEW:   CBC Recent Labs  Lab 05/28/18 1249  WBC 11.8*  HGB 10.5*  HCT 31.9*  PLT 235    Chemistries  Recent Labs  Lab 05/28/18 1249  NA 136  K 4.9  CL 104  CO2 26  GLUCOSE 95  BUN 20  CREATININE 0.78  CALCIUM 8.6*  AST 45*  ALT 22   ALKPHOS 281*  BILITOT 0.9    Cardiac Enzymes Recent Labs  Lab 05/28/18 1249  TROPONINI <0.03    Microbiology Results  Results for orders placed or performed during the hospital encounter of 03/23/18  Surgical pcr screen     Status: None   Collection Time: 03/23/18  9:41 PM  Result Value Ref Range Status   MRSA, PCR NEGATIVE NEGATIVE Final   Staphylococcus aureus NEGATIVE NEGATIVE Final    Comment: (NOTE) The Xpert SA Assay (FDA approved for NASAL specimens in patients 58 years of age and older), is one component of a comprehensive surveillance program. It is not intended to diagnose infection nor to guide or monitor treatment. Performed at [facility], [address], [place], [place] [address]     RADIOLOGY:  Dg Chest 2 View  Result Date: 05/28/2018 CLINICAL DATA:  Left acute rib fracture  by CT EXAM: CHEST - 2 VIEW COMPARISON:  05/23/2018 FINDINGS: Left ninth and tenth rib fractures are better appreciated by CT comparison. Low lung volumes persist with mild increased interstitial changes, suggesting early volume overload or developing edema. No enlarging effusion or pneumothorax. Trachea is midline. Aorta atherosclerotic. Degenerative changes of the spine. Remote lower thoracic vertebral augmentation. IMPRESSION: Persistent low lung volumes with increased diffuse interstitial changes suggesting volume overload or early edema. Left ninth and tenth rib fractures are not appreciated by plain radiography. No significant enlarging effusion or pneumothorax Electronically Signed   By: Jerilynn Mages.  Shick M.D.   On: 05/28/2018 13:09    Follow up with PCP in 1 week.  Management plans discussed with the patient, family and they are in agreement.  CODE STATUS:  Code Status History    Date Active Date Inactive Code Status Order ID Comments User Context   05/27/2018 1039 05/27/2018 1445 Full Code [id number]  Saundra Shelling, MD Inpatient   05/27/2018 0951 05/27/2018 1039 DNR [id number]   Saundra Shelling, MD Inpatient   05/25/2018 1823 05/27/2018 0951 Full Code [id number]  Hillary Bow, MD Inpatient   05/25/2018 1213 05/25/2018 1823 DNR [id number]  Jimmy Footman, NP Inpatient   05/23/2018 1544 05/25/2018 1213 Full Code [id number]  Loletha Grayer, MD ED   05/10/2018 1807 05/11/2018 1805 DNR [id number]  Hessie Knows, MD Inpatient   05/10/2018 1631 05/10/2018 1807 Full Code [id number]  Hessie Knows, MD Inpatient   03/23/2018 2050 03/26/2018 1705 Full Code [id number]  Loletha Grayer, MD ED   05/15/2017 1451 05/18/2017 1446 Full Code [id number]  Idelle Crouch, MD Inpatient   06/27/2015 1557 06/27/2015 1932 Full Code [id number]  Hessie Knows, MD Inpatient    Advance Directive Documentation     Most Recent Value  Type of Advance Directive  Healthcare Power of Attorney  Pre-existing out of facility DNR order (yellow form or pink MOST form)  -  "MOST" Form in Place?  -      TOTAL TIME TAKING CARE OF THIS PATIENT ON DAY OF DISCHARGE: more than 30 minutes.   Neita Carp M.D on 05/30/2018 at 11:19 AM  Between 7am to 6pm - Pager - [phone]  After 6pm go to [email or web address] - password [email or web address] [facility]  Office  [phone]  CC: Primary care physician; Derinda Late, MD  Note: This dictation was prepared with Dragon dictation along with smaller phrase technology. Any transcriptional errors that result from this process are unintentional.

## 2018-05-30 NOTE — Progress Notes (Signed)
Report given to Burnett Corrente at [facility]. EMS will transport.

## 2018-05-30 NOTE — Discharge Instructions (Signed)
Regular diet ° °Activity as tolerated with assistance °

## 2018-05-30 NOTE — Clinical Social Work Note (Signed)
CSW has presented bed offers to patient's daughter who was at bedside this morning. She is aware that the two preferences they wanted are not able to offer at this time. She will call her brother and make a decision between [facility] and [facility]. Shela Leff MSW,LCSW [phone]

## 2018-05-30 NOTE — Clinical Social Work Note (Signed)
Patient's daughter chose [facility] after speaking with her brother. [facility] is aware and discharge information was sent. Patient to transport via EMS. Shela Leff MSW,LCSW [phone]

## 2018-06-06 ENCOUNTER — Other Ambulatory Visit: Payer: Self-pay

## 2018-06-06 ENCOUNTER — Emergency Department: Payer: Medicare Other

## 2018-06-06 ENCOUNTER — Inpatient Hospital Stay
Admission: EM | Admit: 2018-06-06 | Discharge: 2018-06-08 | DRG: 871 | Disposition: A | Discharge status: Skilled Nursing Facility | Payer: Medicare Other | Attending: Internal Medicine | Admitting: Internal Medicine

## 2018-06-06 DIAGNOSIS — G9341 Metabolic encephalopathy: Secondary | ICD-10-CM | POA: Diagnosis present

## 2018-06-06 DIAGNOSIS — A419 Sepsis, unspecified organism: Secondary | ICD-10-CM | POA: Diagnosis present

## 2018-06-06 DIAGNOSIS — S0990XA Unspecified injury of head, initial encounter: Secondary | ICD-10-CM | POA: Diagnosis present

## 2018-06-06 DIAGNOSIS — Y92129 Unspecified place in nursing home as the place of occurrence of the external cause: Secondary | ICD-10-CM

## 2018-06-06 DIAGNOSIS — W1830XA Fall on same level, unspecified, initial encounter: Secondary | ICD-10-CM | POA: Diagnosis present

## 2018-06-06 DIAGNOSIS — F17200 Nicotine dependence, unspecified, uncomplicated: Secondary | ICD-10-CM | POA: Diagnosis present

## 2018-06-06 DIAGNOSIS — Z7983 Long term (current) use of bisphosphonates: Secondary | ICD-10-CM

## 2018-06-06 DIAGNOSIS — Z886 Allergy status to analgesic agent status: Secondary | ICD-10-CM | POA: Diagnosis not present

## 2018-06-06 DIAGNOSIS — I4581 Long QT syndrome: Secondary | ICD-10-CM | POA: Diagnosis present

## 2018-06-06 DIAGNOSIS — E039 Hypothyroidism, unspecified: Secondary | ICD-10-CM | POA: Diagnosis present

## 2018-06-06 DIAGNOSIS — F419 Anxiety disorder, unspecified: Secondary | ICD-10-CM | POA: Diagnosis present

## 2018-06-06 DIAGNOSIS — S41111A Laceration without foreign body of right upper arm, initial encounter: Secondary | ICD-10-CM | POA: Diagnosis present

## 2018-06-06 DIAGNOSIS — Z79899 Other long term (current) drug therapy: Secondary | ICD-10-CM | POA: Diagnosis not present

## 2018-06-06 DIAGNOSIS — S0101XA Laceration without foreign body of scalp, initial encounter: Secondary | ICD-10-CM | POA: Diagnosis present

## 2018-06-06 DIAGNOSIS — T68XXXA Hypothermia, initial encounter: Secondary | ICD-10-CM | POA: Diagnosis present

## 2018-06-06 DIAGNOSIS — Z7982 Long term (current) use of aspirin: Secondary | ICD-10-CM | POA: Diagnosis not present

## 2018-06-06 DIAGNOSIS — R402143 Coma scale, eyes open, spontaneous, at hospital admission: Secondary | ICD-10-CM | POA: Diagnosis present

## 2018-06-06 DIAGNOSIS — Z8619 Personal history of other infectious and parasitic diseases: Secondary | ICD-10-CM

## 2018-06-06 DIAGNOSIS — R402253 Coma scale, best verbal response, oriented, at hospital admission: Secondary | ICD-10-CM | POA: Diagnosis present

## 2018-06-06 DIAGNOSIS — E739 Lactose intolerance, unspecified: Secondary | ICD-10-CM | POA: Diagnosis present

## 2018-06-06 DIAGNOSIS — F0390 Unspecified dementia without behavioral disturbance: Secondary | ICD-10-CM | POA: Diagnosis present

## 2018-06-06 DIAGNOSIS — N390 Urinary tract infection, site not specified: Secondary | ICD-10-CM | POA: Diagnosis present

## 2018-06-06 DIAGNOSIS — I509 Heart failure, unspecified: Secondary | ICD-10-CM | POA: Diagnosis present

## 2018-06-06 DIAGNOSIS — R402363 Coma scale, best motor response, obeys commands, at hospital admission: Secondary | ICD-10-CM | POA: Diagnosis present

## 2018-06-06 DIAGNOSIS — G9340 Encephalopathy, unspecified: Secondary | ICD-10-CM

## 2018-06-06 LAB — CBC WITH DIFFERENTIAL/PLATELET
ABS IMMATURE GRANULOCYTES: 0.08 10*3/uL — AB (ref 0.00–0.07)
BASOS PCT: 0 %
Basophils Absolute: 0 10*3/uL (ref 0.0–0.1)
Eosinophils Absolute: 0.1 10*3/uL (ref 0.0–0.5)
Eosinophils Relative: 1 %
HCT: 37.8 % (ref 36.0–46.0)
Hemoglobin: 12 g/dL (ref 12.0–15.0)
Immature Granulocytes: 1 %
Lymphocytes Relative: 10 %
Lymphs Abs: 1.3 10*3/uL (ref 0.7–4.0)
MCH: 28.6 pg (ref 26.0–34.0)
MCHC: 31.7 g/dL (ref 30.0–36.0)
MCV: 90.2 fL (ref 80.0–100.0)
Monocytes Absolute: 1.2 10*3/uL — ABNORMAL HIGH (ref 0.1–1.0)
Monocytes Relative: 9 %
Neutro Abs: 10.8 10*3/uL — ABNORMAL HIGH (ref 1.7–7.7)
Neutrophils Relative %: 79 %
Platelets: 249 10*3/uL (ref 150–400)
RBC: 4.19 MIL/uL (ref 3.87–5.11)
RDW: 19.7 % — ABNORMAL HIGH (ref 11.5–15.5)
WBC: 13.5 10*3/uL — ABNORMAL HIGH (ref 4.0–10.5)
nRBC: 0 % (ref 0.0–0.2)

## 2018-06-06 LAB — COMPREHENSIVE METABOLIC PANEL
ALBUMIN: 2.3 g/dL — AB (ref 3.5–5.0)
ALT: 27 U/L (ref 0–44)
AST: 47 U/L — ABNORMAL HIGH (ref 15–41)
Alkaline Phosphatase: 342 U/L — ABNORMAL HIGH (ref 38–126)
Anion gap: 7 (ref 5–15)
BILIRUBIN TOTAL: 0.9 mg/dL (ref 0.3–1.2)
BUN: 12 mg/dL (ref 8–23)
CO2: 25 mmol/L (ref 22–32)
Calcium: 8.3 mg/dL — ABNORMAL LOW (ref 8.9–10.3)
Chloride: 105 mmol/L (ref 98–111)
Creatinine, Ser: 0.96 mg/dL (ref 0.44–1.00)
GFR calc Af Amer: >60 mL/min (ref >60)
GFR calc non Af Amer: 53 mL/min — ABNORMAL LOW (ref >60)
Glucose, Bld: 139 mg/dL — ABNORMAL HIGH (ref 70–99)
POTASSIUM: 4.1 mmol/L (ref 3.5–5.1)
Sodium: 137 mmol/L (ref 135–145)
Total Protein: 6 g/dL — ABNORMAL LOW (ref 6.5–8.1)

## 2018-06-06 LAB — LACTIC ACID, PLASMA: Lactic Acid, Venous: 2.3 mmol/L (ref 0.5–1.9)

## 2018-06-06 LAB — URINALYSIS, COMPLETE (UACMP) WITH MICROSCOPIC
Bilirubin Urine: NEGATIVE
Glucose, UA: NEGATIVE mg/dL
Ketones, ur: NEGATIVE mg/dL
Nitrite: NEGATIVE
Protein, ur: 30 mg/dL — AB
Specific Gravity, Urine: 1.017 (ref 1.005–1.030)
WBC, UA: >50 WBC/hpf — ABNORMAL HIGH (ref 0–5)
pH: 5 (ref 5.0–8.0)

## 2018-06-06 LAB — PROTIME-INR
INR: 1.29
Prothrombin Time: 16 seconds — ABNORMAL HIGH (ref 11.4–15.2)

## 2018-06-06 MED ORDER — SODIUM CHLORIDE 0.9 % IV SOLN
1.0000 g | Freq: Once | INTRAVENOUS | Status: AC
  Administered 2018-06-06: 1 g via INTRAVENOUS
  Filled 2018-06-06: qty 10

## 2018-06-06 MED ORDER — SODIUM CHLORIDE 0.9 % IV BOLUS
500.0000 mL | Freq: Once | INTRAVENOUS | Status: AC
  Administered 2018-06-06: 500 mL via INTRAVENOUS

## 2018-06-06 NOTE — ED Notes (Signed)
Patient stats in low 90's with rest. Patient placed on 2 liter O2.

## 2018-06-06 NOTE — ED Notes (Signed)
Pt resting quietly.

## 2018-06-06 NOTE — ED Notes (Signed)
Non stick dressing applied to the skin tears on the right forearm and the right hand. Pt has a 3 inch skin tear to the posterior forearm just distal to the [facility]. Pt has 2 skin tears to the posterior right hand proximal to the thumb.

## 2018-06-06 NOTE — ED Notes (Signed)
Urine collected by straight cath by this writer and ED tech help.

## 2018-06-06 NOTE — ED Notes (Signed)
ED TO INPATIENT HANDOFF REPORT  Name/Age/Gender Tina [name] 84 y.o. female  Code Status Code Status History    Date Active Date Inactive Code Status Order ID Comments User Context   05/27/2018 1039 05/27/2018 1445 Full Code [id number]  Saundra Shelling, MD Inpatient   05/27/2018 0951 05/27/2018 1039 DNR [id number]  Saundra Shelling, MD Inpatient   05/25/2018 1823 05/27/2018 0951 Full Code [id number]  Hillary Bow, MD Inpatient   05/25/2018 1213 05/25/2018 1823 DNR [id number]  Jimmy Footman, NP Inpatient   05/23/2018 1544 05/25/2018 1213 Full Code [id number]  Loletha Grayer, MD ED   05/10/2018 1807 05/11/2018 1805 DNR [id number]  Hessie Knows, MD Inpatient   05/10/2018 1631 05/10/2018 1807 Full Code [id number]  Hessie Knows, MD Inpatient   03/23/2018 2050 03/26/2018 1705 Full Code [id number]  Loletha Grayer, MD ED   05/15/2017 1451 05/18/2017 1446 Full Code [id number]  Idelle Crouch, MD Inpatient   06/27/2015 1557 06/27/2015 1932 Full Code [id number]  Hessie Knows, MD Inpatient    Advance Directive Documentation     Most Recent Value  Type of Advance Directive  Healthcare Power of [place], Out of facility DNR (pink MOST or yellow form)  Pre-existing out of facility DNR order (yellow form or pink MOST form)  Pink MOST form placed in chart (order not valid for inpatient use)  "MOST" Form in Place?  -      Home/SNF/Other Nursing Home  Chief Complaint Fall; Laceration  Level of Care/Admitting Diagnosis ED Disposition    ED Disposition Condition [place]: [place] [100120]  Level of Care: Med-Surg [16]  Diagnosis: Sepsis [facility]) [[id number]]  Admitting Physician: Amelia Jo [[id number]]  Attending Physician: Amelia Jo [phone]  Estimated length of stay: past midnight tomorrow  Certification:: I certify this patient will need inpatient services for at least 2 midnights  PT Class (Do Not Modify): Inpatient [101]  PT  Acc Code (Do Not Modify): Private [1]       Medical History Past Medical History:  Diagnosis Date  . Anxiety   . Cancer ([place])    breast  . Dementia ([place])   . Hypothyroidism   . Shingles     Allergies Allergies  Allergen Reactions  . Nsaids Other (See Comments)    H/O GI BLEED  . Lactose Intolerance (Gi)   . Meloxicam     Other reaction(s): Other (See Comments) Upper GI Bleeding    IV Location/Drains/Wounds Patient Lines/Drains/Airways Status   Active Line/Drains/Airways    Name:   Placement date:   Placement time:   Site:   Days:   Peripheral IV 06/06/18 Left Wrist   06/06/18    1924    Wrist   less than 1   External Urinary Catheter   05/30/18    1132    -   7   Incision (Closed) 03/24/18 Arm   03/24/18    1255     74   Pressure Injury 05/23/18 Stage I -  Intact skin with non-blanchable redness of a localized area usually over a bony prominence.   05/23/18    1905     14          Labs/Imaging Results for orders placed or performed during the hospital encounter of 06/06/18 (from the past 48 hour(s))  Comprehensive metabolic panel     Status: Abnormal   Collection Time: 06/06/18  7:35 PM  Result Value Ref Range   Sodium 137 135 -  145 mmol/L   Potassium 4.1 3.5 - 5.1 mmol/L   Chloride 105 98 - 111 mmol/L   CO2 25 22 - 32 mmol/L   Glucose, Bld 139 (H) 70 - 99 mg/dL   BUN 12 8 - 23 mg/dL   Creatinine, Ser 0.96 0.44 - 1.00 mg/dL   Calcium 8.3 (L) 8.9 - 10.3 mg/dL   Total Protein 6.0 (L) 6.5 - 8.1 g/dL   Albumin 2.3 (L) 3.5 - 5.0 g/dL   AST 47 (H) 15 - 41 U/L   ALT 27 0 - 44 U/L   Alkaline Phosphatase 342 (H) 38 - 126 U/L   Total Bilirubin 0.9 0.3 - 1.2 mg/dL   GFR calc non Af Amer 53 (L) >60 mL/min   GFR calc Af Amer >60 >60 mL/min   Anion gap 7 5 - 15    Comment: Performed at [facility], [address] [place], [place] [address]  CBC with Differential     Status: Abnormal   Collection Time: 06/06/18  7:35 PM  Result Value Ref Range   WBC 13.5  (H) 4.0 - 10.5 K/uL   RBC 4.19 3.87 - 5.11 MIL/uL   Hemoglobin 12.0 12.0 - 15.0 g/dL   HCT 37.8 36.0 - 46.0 %   MCV 90.2 80.0 - 100.0 fL   MCH 28.6 26.0 - 34.0 pg   MCHC 31.7 30.0 - 36.0 g/dL   RDW 19.7 (H) 11.5 - 15.5 %   Platelets 249 150 - 400 K/uL   nRBC 0.0 0.0 - 0.2 %   Neutrophils Relative % 79 %   Neutro Abs 10.8 (H) 1.7 - 7.7 K/uL   Lymphocytes Relative 10 %   Lymphs Abs 1.3 0.7 - 4.0 K/uL   Monocytes Relative 9 %   Monocytes Absolute 1.2 (H) 0.1 - 1.0 K/uL   Eosinophils Relative 1 %   Eosinophils Absolute 0.1 0.0 - 0.5 K/uL   Basophils Relative 0 %   Basophils Absolute 0.0 0.0 - 0.1 K/uL   Immature Granulocytes 1 %   Abs Immature Granulocytes 0.08 (H) 0.00 - 0.07 K/uL    Comment: Performed at [facility], [address] [place], [place] [address]  Lactic acid, plasma     Status: Abnormal   Collection Time: 06/06/18  7:35 PM  Result Value Ref Range   Lactic Acid, Venous 2.3 (HH) 0.5 - 1.9 mmol/L    Comment: CRITICAL RESULT CALLED TO, READ BACK BY AND VERIFIED WITH [place] @2020  06/06/18 [facility]LC Performed at [facility], [address] [place], [place] [address]   Urinalysis, Complete w Microscopic     Status: Abnormal   Collection Time: 06/06/18  8:05 PM  Result Value Ref Range   Color, Urine AMBER (A) YELLOW    Comment: BIOCHEMICALS MAY BE AFFECTED BY COLOR   APPearance CLOUDY (A) CLEAR   Specific Gravity, Urine 1.017 1.005 - 1.030   pH 5.0 5.0 - 8.0   Glucose, UA NEGATIVE NEGATIVE mg/dL   Hgb urine dipstick SMALL (A) NEGATIVE   Bilirubin Urine NEGATIVE NEGATIVE   Ketones, ur NEGATIVE NEGATIVE mg/dL   Protein, ur 30 (A) NEGATIVE mg/dL   Nitrite NEGATIVE NEGATIVE   Leukocytes, UA MODERATE (A) NEGATIVE   RBC / HPF 11-20 0 - 5 RBC/hpf   WBC, UA >50 (H) 0 - 5 WBC/hpf   Bacteria, UA RARE (A) NONE SEEN   Squamous Epithelial / LPF 0-5 0 - 5   WBC Clumps PRESENT  Hyaline Casts, UA PRESENT     Comment: Performed at [facility], [address] [place], [place] [address]  Protime-INR     Status: Abnormal   Collection Time: 06/06/18  8:52 PM  Result Value Ref Range   Prothrombin Time 16.0 (H) 11.4 - 15.2 seconds   INR 1.29     Comment: Performed at [facility], [address], [place], [place] [address]   Ct Head Wo Contrast  Result Date: 06/06/2018 CLINICAL DATA:  84 y/o  F; head trauma after fall. EXAM: CT HEAD WITHOUT CONTRAST TECHNIQUE: Contiguous axial images were obtained from the base of the skull through the vertex without intravenous contrast. COMPARISON:  05/23/2018 CT head. FINDINGS: Brain: No evidence of acute infarction, hemorrhage, hydrocephalus, extra-axial collection or mass lesion/mass effect. Stable bilateral subdural hygroma. Stable chronic microvascular ischemic changes and volume loss of the brain. Asymmetric volume loss of the left anterior temporal lobe. Vascular: Calcific atherosclerosis of carotid siphons. No hyperdense vessel identified. Skull: Mild left frontal scalp contusion. No calvarial fracture. Sinuses/Orbits: No acute finding. Other: None. IMPRESSION: 1. Small left frontal scalp contusion. 2. No acute intracranial abnormality or calvarial fracture. 3. Stable small bifrontal hygromas. Stable chronic microvascular ischemic changes and volume loss of the brain. 4. Stable asymmetric volume loss of left anterior temporal lobe, possibly sequelae of prior trauma, stroke, or neurodegenerative disorder. Electronically Signed   By: Kristine Garbe M.D.   On: 06/06/2018 20:00   Dg Chest Portable 1 View  Result Date: 06/06/2018 CLINICAL DATA:  Cough for several days EXAM: PORTABLE CHEST 1 VIEW COMPARISON:  05/28/2018 FINDINGS: Haziness at the left base that is stable from prior and attributed to soft tissue attenuation in this patient with breast implants. Low volumes with asymmetric left diaphragm elevation, stable. Normal heart size. Stable aortic tortuosity. Vertebroplasty.  IMPRESSION: Stable from prior.  No evidence of acute disease. Electronically Signed   By: Monte Fantasia M.D.   On: 06/06/2018 19:50    Pending Labs Unresulted Labs (From admission, onward)    Start     Ordered   06/06/18 1930  Culture, blood (routine x 2)  BLOOD CULTURE X 2,   STAT     06/06/18 1929   06/06/18 1930  Lactic acid, plasma  Now then every 2 hours,   STAT     06/06/18 1929          Vitals/Pain Today's Vitals   06/06/18 2200 06/06/18 2230 06/06/18 2330 06/06/18 2331  BP: 108/71 (!) 113/101  109/65  Pulse: 95 (!) 102    Resp: 20 (!) 32 17 20  Temp:      TempSrc:      SpO2: 99% 99%    Weight:      Height:      PainSc:        Isolation Precautions No active isolations  Medications Medications  sodium chloride 0.9 % bolus 500 mL (0 mLs Intravenous Stopped 06/06/18 2056)  sodium chloride 0.9 % bolus 500 mL (0 mLs Intravenous Stopped 06/06/18 2310)  cefTRIAXone (ROCEPHIN) 1 g in sodium chloride 0.9 % 100 mL IVPB (0 g Intravenous Stopped 06/06/18 2241)    Mobility walks with device

## 2018-06-06 NOTE — ED Provider Notes (Signed)
[facility] Emergency Department Provider Note ____________________________________________   First MD Initiated Contact with Patient 06/06/18 1914     (approximate)  I have reviewed the triage vital signs and the nursing notes.   HISTORY  Chief Complaint Fall  Level 5 caveat: History of present illness limited due to dementia  HPI Tina [name] is a 84 y.o. female with PMH as noted below who presents for evaluation after a fall from standing height while at her facility.  The patient hit her head, and sustained skin tears to her right arm.  Patient does not remember the fall.  She denies any acute pain or other symptoms at this time, other than a cough.  Past Medical History:  Diagnosis Date  . Anxiety   . Cancer ([place])    breast  . Dementia ([place])   . Hypothyroidism   . Shingles     Patient Active Problem List   Diagnosis Date Noted  . Sepsis ([place]) 06/06/2018  . Hypoxia 05/28/2018  . Pressure injury of skin 05/24/2018  . Calculus of gallbladder without cholecystitis without obstruction   . Acute kidney injury ([place]) 05/23/2018  . Status post kyphoplasty 05/10/2018  . Wrist fracture, bilateral 03/23/2018  . Dementia in Alzheimer's disease ([place]) 05/24/2017  . GI bleed 05/15/2017  . Acute blood loss anemia 05/15/2017  . Acute encephalopathy 05/15/2017  . Tachycardia 05/15/2017  . AMD (age related macular degeneration) 01/03/2015  . Allergic rhinitis 12/12/2013  . Depressive disorder, not elsewhere classified 12/12/2013  . Hypothyroidism 12/12/2013  . Postherpetic neuralgia 12/12/2013    Past Surgical History:  Procedure Laterality Date  . APPENDECTOMY    . BREAST SURGERY    . EYE SURGERY    . KYPHOPLASTY N/A 06/27/2015   Procedure: KYPHOPLASTY L4;  Surgeon: Hessie Knows, MD;  Location: [facility] ORS;  Service: Orthopedics;  Laterality: N/A;  . KYPHOPLASTY N/A 05/10/2018   Procedure: Manfred Arch;  Surgeon: Hessie Knows, MD;  Location:  [facility] ORS;  Service: Orthopedics;  Laterality: N/A;  . ORIF WRIST FRACTURE Bilateral 03/24/2018   Procedure: OPEN REDUCTION INTERNAL FIXATION (ORIF) WRIST FRACTURE;  Surgeon: Hessie Knows, MD;  Location: [facility] ORS;  Service: Orthopedics;  Laterality: Bilateral;    Prior to Admission medications   Medication Sig Start Date End Date Taking? Authorizing Provider  acetaminophen (TYLENOL) 325 MG tablet Take 650 mg by mouth 4 (four) times daily.   Yes [provider]  albuterol (ACCUNEB) 0.63 MG/3ML nebulizer solution Take 3 mLs by nebulization every 4 (four) hours as needed for wheezing or shortness of breath.   Yes [provider]  alendronate (FOSAMAX) 70 MG tablet Take 70 mg by mouth every Sunday.  02/12/17  Yes [provider]  aspirin EC 81 MG tablet Take 81 mg by mouth daily.   Yes [provider]  Calcium-Phosphorus-Vitamin D (CALCIUM/D3 ADULT GUMMIES PO) Take 1 tablet by mouth 2 (two) times daily. With lunch & supper    Yes [provider]  citalopram (CELEXA) 10 MG tablet Take 10 mg by mouth daily.   Yes [provider]  dicyclomine (BENTYL) 20 MG tablet Take 20 mg by mouth 4 (four) times daily.  03/09/18  Yes [provider]  donepezil (ARICEPT) 10 MG tablet Take 10 mg by mouth at bedtime.    Yes [provider]  feeding supplement, ENSURE ENLIVE, (ENSURE ENLIVE) LIQD Take 237 mLs by mouth 2 (two) times daily between meals. 05/27/18  Yes Saundra Shelling, MD  fluticasone (  FLONASE) 50 MCG/ACT nasal spray Place 2 sprays into both nostrils at bedtime.    Yes [provider]  furosemide (LASIX) 20 MG tablet Take 10 mg by mouth daily.   Yes [provider]  gabapentin (NEURONTIN) 600 MG tablet Take 600 mg by mouth 3 (three) times daily.    Yes [provider]  levothyroxine (SYNTHROID, LEVOTHROID) 112 MCG tablet Take 112 mcg by mouth daily before breakfast.    Yes [provider]  lidocaine  (LIDODERM) 5 % Place 1 patch onto the skin daily. Remove & Discard patch within 12 hours or as directed by MD   Yes [provider]  Multiple Vitamin (MULTIVITAMIN WITH MINERALS) TABS tablet Take 1 tablet by mouth daily with lunch. Alive or Centrum Silver   Yes [provider]  pantoprazole (PROTONIX) 40 MG tablet Take 1 tablet (40 mg total) by mouth 2 (two) times daily. 06/23/17  Yes Vonda Antigua B, MD  potassium chloride SA (K-DUR,KLOR-CON) 20 MEQ tablet Take 20 mEq by mouth daily.   Yes [provider]  Saccharomyces boulardii (PROBIOTIC) 250 MG CAPS Take 250 mg by mouth 2 (two) times daily.   Yes [provider]  traMADol (ULTRAM) 50 MG tablet Take 1 tablet (50 mg total) by mouth every 8 (eight) hours as needed for moderate pain or severe pain. 05/30/18  Yes Sudini, Alveta Heimlich, MD  vitamin C (ASCORBIC ACID) 250 MG tablet Take 500 mg by mouth 2 (two) times daily.   Yes [provider]  acetaminophen (TYLENOL) 325 MG tablet Take 2 tablets (650 mg total) by mouth every 6 (six) hours as needed for mild pain (or Fever >/= 101). 05/18/17   Nicholes Mango, MD    Allergies Nsaids; Lactose intolerance (gi); and Meloxicam  Family History  Problem Relation Age of Onset  . CAD Mother   . CAD Father     Social History Social History   Tobacco Use  . Smoking status: Current Every Day Smoker    Packs/day: 0.50  . Smokeless tobacco: Never Used  Substance Use Topics  . Alcohol use: No  . Drug use: No    Review of Systems Level 5 caveat: Unable to obtain review of systems due to dementia   ____________________________________________   PHYSICAL EXAM:  VITAL SIGNS: ED Triage Vitals  Enc Vitals Group     BP --      Pulse Rate 06/06/18 1909 (!) 103     Resp 06/06/18 1909 18     Temp 06/06/18 1909 98.2 F (36.8 C)     Temp Source 06/06/18 1909 Oral     SpO2 06/06/18 1904 92 %     Weight 06/06/18 1913 151 lb 7.3 oz (68.7 kg)     Height  06/06/18 1913 5\' 3"  (1.6 m)     Head Circumference --      [facility] Flow --      Pain Score 06/06/18 1912 9     Pain Loc --      Pain Edu? --      Excl. in [place]? --     Constitutional: Alert and oriented.  Frail appearing but in no acute distress. Eyes: Conjunctivae are normal.  EOMI.  PERRLA. Head: 8 mm superficial laceration behind left ear, otherwise atraumatic. Nose: No congestion/rhinnorhea. Mouth/Throat: Mucous membranes are slightly dry.   Neck: Normal range of motion.  No midline C-spine tenderness. Cardiovascular: Normal rate, regular rhythm. Grossly normal heart sounds.  Good peripheral circulation. Respiratory: Normal  respiratory effort.  No retractions.  Rales to bilateral bases. Gastrointestinal: Soft and nontender. No distention.  Genitourinary: No flank tenderness. Musculoskeletal: 2+ bilateral lower extremity edema.  Extremities warm and well perfused.  Neurologic:  Normal speech and language. No gross focal neurologic deficits are appreciated.  Skin:  Skin is warm and dry. No rash noted. Psychiatric: Calm and cooperative.  ____________________________________________   LABS (all labs ordered are listed, but only abnormal results are displayed)  Labs Reviewed  COMPREHENSIVE METABOLIC PANEL - Abnormal; Notable for the following components:      Result Value   Glucose, Bld 139 (*)    Calcium 8.3 (*)    Total Protein 6.0 (*)    Albumin 2.3 (*)    AST 47 (*)    Alkaline Phosphatase 342 (*)    GFR calc non Af Amer 53 (*)    All other components within normal limits  CBC WITH DIFFERENTIAL/PLATELET - Abnormal; Notable for the following components:   WBC 13.5 (*)    RDW 19.7 (*)    Neutro Abs 10.8 (*)    Monocytes Absolute 1.2 (*)    Abs Immature Granulocytes 0.08 (*)    All other components within normal limits  URINALYSIS, COMPLETE (UACMP) WITH MICROSCOPIC - Abnormal; Notable for the following components:   Color, Urine AMBER (*)    APPearance CLOUDY (*)    Hgb  urine dipstick SMALL (*)    Protein, ur 30 (*)    Leukocytes, UA MODERATE (*)    WBC, UA >50 (*)    Bacteria, UA RARE (*)    All other components within normal limits  LACTIC ACID, PLASMA - Abnormal; Notable for the following components:   Lactic Acid, Venous 2.3 (*)    All other components within normal limits  PROTIME-INR - Abnormal; Notable for the following components:   Prothrombin Time 16.0 (*)    All other components within normal limits  CULTURE, BLOOD (ROUTINE X 2)  CULTURE, BLOOD (ROUTINE X 2)  LACTIC ACID, PLASMA   ____________________________________________  EKG  ED ECG REPORT I, Arta Silence, the attending physician, personally viewed and interpreted this ECG.  Date: 06/06/2018 EKG Time: 1911 Rate: 105 Rhythm: Sinus tachycardia QRS Axis: normal Intervals: Prolonged QTc ST/T Wave abnormalities: normal Narrative Interpretation: no evidence of acute ischemia  ____________________________________________  RADIOLOGY  CT head: No ICH or acute fracture CXR: No focal infiltrate  ____________________________________________   PROCEDURES  Procedure(s) performed: Yes  CRITICAL CARE Performed by: Arta Silence  .Marland KitchenLaceration Repair Date/Time: 06/06/2018 11:48 PM Performed by: Arta Silence, MD Authorized by: Arta Silence, MD   Consent:    Consent obtained:  Verbal   Consent given by:  Patient   Risks discussed:  Infection, pain and poor wound healing Anesthesia (see MAR for exact dosages):    Anesthesia method:  None Laceration details:    Location:  Scalp   Scalp location:  L parietal   Length (cm):  1   Depth (mm):  2 Repair type:    Repair type:  Simple Exploration:    Hemostasis achieved with:  Direct pressure   Wound exploration: entire depth of wound probed and visualized     Contaminated: no   Treatment:    Area cleansed with:  Saline   Amount of cleaning:  Standard Skin repair:    Repair method:  Staples    Number of staples:  2 Approximation:    Approximation:  Close Post-procedure details:    Dressing:  Open (no  dressing)   Patient tolerance of procedure:  Tolerated well, no immediate complications    Critical Care performed: No ____________________________________________   INITIAL IMPRESSION / ASSESSMENT AND PLAN / ED COURSE  Pertinent labs & imaging results that were available during my care of the patient were reviewed by me and considered in my medical decision making (see chart for details).  84 year old female with PMH as noted above presents with from [facility] after an apparent fall from standing height.  The patient does not remember the fall.  She denies any acute pain.  I reviewed the past medical records in Epic; the patient was admitted earlier this month after a fall with rib fractures and resulting hypoxia.  On exam, the patient has borderline tachycardia and an O2 saturation in the low 90s on room air.  She is coughing intermittently and has externally audible rhonchi and some rales in the bases.  She has no respiratory distress or increased work of breathing.  She has several skin tears to her right forearm not requiring repair.  There are no deformities to the extremities, and no midline spinal tenderness, or chest wall or abdominal tenderness.  In terms of the fall, I will obtain a CT head to rule out intracranial injury.  There is no evidence of other trauma requiring imaging.  Given her cough, borderline tachycardia, and abnormal breath sounds, the differential includes pneumonia, bronchitis, or atelectasis.  Less likely CHF.  We will obtain chest x-ray and lab work-up including sepsis labs.  ----------------------------------------- 11:50 PM on 06/06/2018 -----------------------------------------  The patient's chest x-ray shows no acute abnormalities, however the lab work-up reveals elevated lactate, elevated WBC count, and UA consistent with UTI.  The  patient has no recent urine cultures so I started her empirically on ceftriaxone.  Given the lab findings consistent with sepsis, she will require admission.  I signed the patient out to the hospitalist Dr. Duane Boston.  I repaired a small laceration behind the left ear with staples.  ____________________________________________   FINAL CLINICAL IMPRESSION(S) / ED DIAGNOSES  Final diagnoses:  Urinary tract infection without hematuria, site unspecified  Injury of head, initial encounter  Laceration of scalp, initial encounter      NEW MEDICATIONS STARTED DURING THIS VISIT:  New Prescriptions   No medications on file     Note:  This document was prepared using Dragon voice recognition software and may include unintentional dictation errors.    Arta Silence, MD 06/06/18 2351

## 2018-06-06 NOTE — ED Triage Notes (Signed)
Patient came to ED by [facility] from [facility]. Patient had witnessed fall. Patient has multiple skin tears on right hand and arm that is currently wrapped in Kerlix. Patient has a head lac on left side of head bleeding controlled. Patient has had multiple falls over the past month and currently has fractured ribs due to past fall. Patient is alert and oriented to self. Patient has cough with congestion. Patient arrives with 2lt of O2.

## 2018-06-06 NOTE — ED Notes (Addendum)
Lab reported a critical Lactic acid 2.3. EDP Dr. Cherylann Banas made aware.

## 2018-06-06 NOTE — ED Notes (Signed)
Patient received 2 staples to left side of head behind ear. Patient tolerated with no issues.

## 2018-06-07 LAB — BASIC METABOLIC PANEL
ANION GAP: 6 (ref 5–15)
BUN: 12 mg/dL (ref 8–23)
CO2: 26 mmol/L (ref 22–32)
Calcium: 7.9 mg/dL — ABNORMAL LOW (ref 8.9–10.3)
Chloride: 108 mmol/L (ref 98–111)
Creatinine, Ser: 0.78 mg/dL (ref 0.44–1.00)
GFR calc Af Amer: >60 mL/min (ref >60)
GFR calc non Af Amer: >60 mL/min (ref >60)
Glucose, Bld: 95 mg/dL (ref 70–99)
Potassium: 4 mmol/L (ref 3.5–5.1)
Sodium: 140 mmol/L (ref 135–145)

## 2018-06-07 LAB — CBC
HCT: 35.5 % — ABNORMAL LOW (ref 36.0–46.0)
Hemoglobin: 11.3 g/dL — ABNORMAL LOW (ref 12.0–15.0)
MCH: 28.5 pg (ref 26.0–34.0)
MCHC: 31.8 g/dL (ref 30.0–36.0)
MCV: 89.6 fL (ref 80.0–100.0)
Platelets: 258 10*3/uL (ref 150–400)
RBC: 3.96 MIL/uL (ref 3.87–5.11)
RDW: 19.5 % — ABNORMAL HIGH (ref 11.5–15.5)
WBC: 16.2 10*3/uL — ABNORMAL HIGH (ref 4.0–10.5)
nRBC: 0 % (ref 0.0–0.2)

## 2018-06-07 LAB — GLUCOSE, CAPILLARY: GLUCOSE-CAPILLARY: 80 mg/dL (ref 70–99)

## 2018-06-07 LAB — LACTIC ACID, PLASMA: Lactic Acid, Venous: 1.4 mmol/L (ref 0.5–1.9)

## 2018-06-07 LAB — TSH: TSH: 2.987 u[IU]/mL (ref 0.350–4.500)

## 2018-06-07 MED ORDER — SODIUM CHLORIDE 0.9 % IV SOLN
1.0000 g | INTRAVENOUS | Status: DC
  Administered 2018-06-07: 1 g via INTRAVENOUS
  Filled 2018-06-07: qty 1
  Filled 2018-06-07: qty 10

## 2018-06-07 MED ORDER — TRAZODONE HCL 50 MG PO TABS
25.0000 mg | ORAL_TABLET | Freq: Every evening | ORAL | Status: DC | PRN
  Administered 2018-06-07: 25 mg via ORAL
  Filled 2018-06-07: qty 1

## 2018-06-07 MED ORDER — POTASSIUM CHLORIDE CRYS ER 20 MEQ PO TBCR
20.0000 meq | EXTENDED_RELEASE_TABLET | Freq: Every day | ORAL | Status: DC
  Administered 2018-06-07 – 2018-06-08 (×2): 20 meq via ORAL
  Filled 2018-06-07 (×2): qty 1

## 2018-06-07 MED ORDER — CITALOPRAM HYDROBROMIDE 20 MG PO TABS
10.0000 mg | ORAL_TABLET | Freq: Every day | ORAL | Status: DC
  Administered 2018-06-07 – 2018-06-08 (×2): 10 mg via ORAL
  Filled 2018-06-07 (×2): qty 1

## 2018-06-07 MED ORDER — ONDANSETRON HCL 4 MG/2ML IJ SOLN: 4.0000 mg | Freq: Four times a day (QID) | INTRAMUSCULAR | Status: DC | PRN

## 2018-06-07 MED ORDER — TRAMADOL HCL 50 MG PO TABS: 50.0000 mg | ORAL_TABLET | Freq: Three times a day (TID) | ORAL | Status: DC | PRN

## 2018-06-07 MED ORDER — ADULT MULTIVITAMIN W/MINERALS CH
1.0000 | ORAL_TABLET | Freq: Every day | ORAL | Status: DC
  Administered 2018-06-07 – 2018-06-08 (×2): 1 via ORAL
  Filled 2018-06-07: qty 1

## 2018-06-07 MED ORDER — GABAPENTIN 300 MG PO CAPS
600.0000 mg | ORAL_CAPSULE | Freq: Three times a day (TID) | ORAL | Status: DC
  Administered 2018-06-07 – 2018-06-08 (×4): 600 mg via ORAL
  Filled 2018-06-07 (×4): qty 2

## 2018-06-07 MED ORDER — DICYCLOMINE HCL 20 MG PO TABS
20.0000 mg | ORAL_TABLET | Freq: Four times a day (QID) | ORAL | Status: DC
  Administered 2018-06-07 – 2018-06-08 (×5): 20 mg via ORAL
  Filled 2018-06-07 (×8): qty 1

## 2018-06-07 MED ORDER — DOCUSATE SODIUM 100 MG PO CAPS
100.0000 mg | ORAL_CAPSULE | Freq: Two times a day (BID) | ORAL | Status: DC
  Administered 2018-06-07 – 2018-06-08 (×3): 100 mg via ORAL
  Filled 2018-06-07 (×3): qty 1

## 2018-06-07 MED ORDER — FLUTICASONE PROPIONATE 50 MCG/ACT NA SUSP
2.0000 | Freq: Every day | NASAL | Status: DC
  Administered 2018-06-07: 2 via NASAL
  Filled 2018-06-07: qty 16

## 2018-06-07 MED ORDER — SACCHAROMYCES BOULARDII 250 MG PO CAPS
250.0000 mg | ORAL_CAPSULE | Freq: Two times a day (BID) | ORAL | Status: DC
  Administered 2018-06-07 – 2018-06-08 (×3): 250 mg via ORAL
  Filled 2018-06-07 (×5): qty 1

## 2018-06-07 MED ORDER — ASPIRIN EC 81 MG PO TBEC
81.0000 mg | DELAYED_RELEASE_TABLET | Freq: Every day | ORAL | Status: DC
  Administered 2018-06-07 – 2018-06-08 (×2): 81 mg via ORAL
  Filled 2018-06-07 (×2): qty 1

## 2018-06-07 MED ORDER — SODIUM CHLORIDE 0.9 % IV SOLN
INTRAVENOUS | Status: DC
  Administered 2018-06-07: 03:00:00 via INTRAVENOUS

## 2018-06-07 MED ORDER — LIDOCAINE 5 % EX PTCH
1.0000 | MEDICATED_PATCH | CUTANEOUS | Status: DC
  Administered 2018-06-07 – 2018-06-08 (×2): 1 via TRANSDERMAL
  Filled 2018-06-07 (×2): qty 1

## 2018-06-07 MED ORDER — LEVOTHYROXINE SODIUM 112 MCG PO TABS
112.0000 ug | ORAL_TABLET | Freq: Every day | ORAL | Status: DC
  Administered 2018-06-08: 112 ug via ORAL
  Filled 2018-06-07 (×2): qty 1

## 2018-06-07 MED ORDER — DONEPEZIL HCL 5 MG PO TABS
10.0000 mg | ORAL_TABLET | Freq: Every day | ORAL | Status: DC
  Administered 2018-06-07: 10 mg via ORAL
  Filled 2018-06-07 (×2): qty 2

## 2018-06-07 MED ORDER — BISACODYL 5 MG PO TBEC: 5.0000 mg | DELAYED_RELEASE_TABLET | Freq: Every day | ORAL | Status: DC | PRN

## 2018-06-07 MED ORDER — HYDROCODONE-ACETAMINOPHEN 5-325 MG PO TABS: 1.0000 | ORAL_TABLET | ORAL | Status: DC | PRN

## 2018-06-07 MED ORDER — ACETAMINOPHEN 325 MG PO TABS: 650.0000 mg | ORAL_TABLET | Freq: Four times a day (QID) | ORAL | Status: DC | PRN

## 2018-06-07 MED ORDER — ACETAMINOPHEN 650 MG RE SUPP: 650.0000 mg | Freq: Four times a day (QID) | RECTAL | Status: DC | PRN

## 2018-06-07 MED ORDER — ALENDRONATE SODIUM 70 MG PO TABS: 70.0000 mg | ORAL_TABLET | ORAL | Status: DC

## 2018-06-07 MED ORDER — CALCIUM CARBONATE-VITAMIN D 500-200 MG-UNIT PO TABS
1.0000 | ORAL_TABLET | Freq: Two times a day (BID) | ORAL | Status: DC
  Administered 2018-06-07 – 2018-06-08 (×3): 1 via ORAL
  Filled 2018-06-07 (×3): qty 1

## 2018-06-07 MED ORDER — VITAMIN C 500 MG PO TABS
500.0000 mg | ORAL_TABLET | Freq: Two times a day (BID) | ORAL | Status: DC
  Administered 2018-06-07 – 2018-06-08 (×3): 500 mg via ORAL
  Filled 2018-06-07 (×3): qty 1

## 2018-06-07 MED ORDER — PANTOPRAZOLE SODIUM 40 MG PO TBEC
40.0000 mg | DELAYED_RELEASE_TABLET | Freq: Two times a day (BID) | ORAL | Status: DC
  Administered 2018-06-07 – 2018-06-08 (×3): 40 mg via ORAL
  Filled 2018-06-07 (×3): qty 1

## 2018-06-07 MED ORDER — ALBUTEROL SULFATE (2.5 MG/3ML) 0.083% IN NEBU: 3.0000 mL | INHALATION_SOLUTION | RESPIRATORY_TRACT | Status: DC | PRN

## 2018-06-07 MED ORDER — HEPARIN SODIUM (PORCINE) 5000 UNIT/ML IJ SOLN
5000.0000 [IU] | Freq: Three times a day (TID) | INTRAMUSCULAR | Status: DC
  Administered 2018-06-07 – 2018-06-08 (×4): 5000 [IU] via SUBCUTANEOUS
  Filled 2018-06-07 (×4): qty 1

## 2018-06-07 MED ORDER — ONDANSETRON HCL 4 MG PO TABS: 4.0000 mg | ORAL_TABLET | Freq: Four times a day (QID) | ORAL | Status: DC | PRN

## 2018-06-07 MED ORDER — ENSURE ENLIVE PO LIQD
237.0000 mL | Freq: Two times a day (BID) | ORAL | Status: DC
  Administered 2018-06-07 – 2018-06-08 (×4): 237 mL via ORAL

## 2018-06-07 NOTE — Progress Notes (Signed)
[place] at [place] NAME: Tina [name]    MR#:  [id number]  DATE OF BIRTH:  March 30, 1928  SUBJECTIVE:  CHIEF COMPLAINT:   Chief Complaint  Patient presents with  . Fall  pleasantly confused. Wants to go home REVIEW OF SYSTEMS:  Review of Systems  Unable to perform ROS: Dementia   DRUG ALLERGIES:   Allergies  Allergen Reactions  . Nsaids Other (See Comments)    H/O GI BLEED  . Lactose Intolerance (Gi)   . Meloxicam     Other reaction(s): Other (See Comments) Upper GI Bleeding   VITALS:  Blood pressure 107/70, pulse (!) 106, temperature (!) 97.5 F (36.4 C), temperature source Oral, resp. rate 16, height 5\' 3"  (1.6 m), weight 67.6 kg, SpO2 94 %. PHYSICAL EXAMINATION:  Physical Exam  HENT:  Head: Normocephalic and atraumatic.  Eyes: Pupils are equal, round, and reactive to light. Conjunctivae and EOM are normal.  Neck: Normal range of motion. Neck supple. No tracheal deviation present. No thyromegaly present.  Cardiovascular: Normal rate, regular rhythm and normal heart sounds.  Pulmonary/Chest: Effort normal and breath sounds normal. No respiratory distress. She has no wheezes. She exhibits no tenderness.  Abdominal: Soft. Bowel sounds are normal. She exhibits no distension. There is no tenderness.  Musculoskeletal: Normal range of motion.  Neurological: She is alert. She is disoriented. No cranial nerve deficit.  pleasantly confused  Skin: Skin is warm and dry. No rash noted.   LABORATORY PANEL:  Female CBC Recent Labs  Lab 06/07/18 0218  WBC 16.2*  HGB 11.3*  HCT 35.5*  PLT 258   ------------------------------------------------------------------------------------------------------------------ Chemistries  Recent Labs  Lab 06/06/18 1935 06/07/18 0218  NA 137 140  K 4.1 4.0  CL 105 108  CO2 25 26  GLUCOSE 139* 95  BUN 12 12  CREATININE 0.96 0.78  CALCIUM 8.3* 7.9*  AST 47*  --   ALT 27  --   ALKPHOS 342*   --   BILITOT 0.9  --    RADIOLOGY:  Ct Head Wo Contrast  Result Date: 06/06/2018 CLINICAL DATA:  84 y/o  F; head trauma after fall. EXAM: CT HEAD WITHOUT CONTRAST TECHNIQUE: Contiguous axial images were obtained from the base of the skull through the vertex without intravenous contrast. COMPARISON:  05/23/2018 CT head. FINDINGS: Brain: No evidence of acute infarction, hemorrhage, hydrocephalus, extra-axial collection or mass lesion/mass effect. Stable bilateral subdural hygroma. Stable chronic microvascular ischemic changes and volume loss of the brain. Asymmetric volume loss of the left anterior temporal lobe. Vascular: Calcific atherosclerosis of carotid siphons. No hyperdense vessel identified. Skull: Mild left frontal scalp contusion. No calvarial fracture. Sinuses/Orbits: No acute finding. Other: None. IMPRESSION: 1. Small left frontal scalp contusion. 2. No acute intracranial abnormality or calvarial fracture. 3. Stable small bifrontal hygromas. Stable chronic microvascular ischemic changes and volume loss of the brain. 4. Stable asymmetric volume loss of left anterior temporal lobe, possibly sequelae of prior trauma, stroke, or neurodegenerative disorder. Electronically Signed   By: Kristine Garbe M.D.   On: 06/06/2018 20:00   Dg Chest Portable 1 View  Result Date: 06/06/2018 CLINICAL DATA:  Cough for several days EXAM: PORTABLE CHEST 1 VIEW COMPARISON:  05/28/2018 FINDINGS: Haziness at the left base that is stable from prior and attributed to soft tissue attenuation in this patient with breast implants. Low volumes with asymmetric left diaphragm elevation, stable. Normal heart size. Stable aortic tortuosity. Vertebroplasty. IMPRESSION: Stable from prior.  No  evidence of acute disease. Electronically Signed   By: Monte Fantasia M.D.   On: 06/06/2018 19:50   ASSESSMENT AND PLAN:  1.  Sepsis likely secondary to UTI - continue IV Rocephin.   pending urine and blood cultures results. 2.   Acute UTI, see treatment as above under #1. 3.  Acute encephalopathy, likely metabolic secondary to UTI/sepsis in a patient with chronic dementia.  monitor 4.  CHF, currently clinically compensated.  Will monitor patient closely and limit IV fluids as much as possible.  Resume maintenance medical treatment. 5.  Hypothyroidism, continue supplemental Synthroid. Check TSH     All the records are reviewed and case discussed with Care Management/Social Worker. Management plans discussed with the patient, nursing and they are in agreement.  CODE STATUS: Full Code  TOTAL TIME TAKING CARE OF THIS PATIENT: 35 minutes.   More than 50% of the time was spent in counseling/coordination of care: YES  POSSIBLE D/C IN 1-2 DAYS, DEPENDING ON CLINICAL CONDITION.   Max Sane M.D on 06/07/2018 at 8:38 AM  Between 7am to 6pm - Pager - [phone]  After 6pm go to [email or web address] - Proofreader  [facility] [place]  Office  [phone]  CC: Primary care physician; Derinda Late, MD  Note: This dictation was prepared with Dragon dictation along with smaller phrase technology. Any transcriptional errors that result from this process are unintentional.

## 2018-06-07 NOTE — ED Notes (Signed)
Patient's floor nurse came and transferred patient to floor.

## 2018-06-07 NOTE — Clinical Social Work Note (Signed)
Clinical Social Work Assessment  Patient Details  Name: Tina [name] MRN: [id number] Date of Birth: Mar 12, 1928  Date of referral:  06/07/18               Reason for consult:  Other (Comment Required)(From [facility]. )                Permission sought to share information with:  Chartered certified accountant granted to share information::  Yes, Verbal Permission Granted  Name::      [facility] ([facility], [place], [place])  Agency::     Relationship::     Contact Information:     Housing/Transportation Living arrangements for the past 2 months:  [place] of Information:  Adult Children Patient Interpreter Needed:  None Criminal Activity/Legal Involvement Pertinent to Current Situation/Hospitalization:  No - Comment as needed Significant Relationships:  Adult Children Lives with:  Facility Resident Do you feel safe going back to the place where you live?  Yes Need for family participation in patient care:  Yes (Comment)  Care giving concerns:  Patient is a short term rehab resident at [facility].    Social Worker assessment / plan:  Holiday representative (CSW) reviewed chart and noted that patient is from [facility]. Per Otila Kluver [facility] liaison patient has used around 31 days at [facility] and can return when stable. CSW met with patient's son [name] in the family waiting room at his request. [name] called his sister Levander Campion and had her on speaker phone during the meeting. Per [name] and Levander Campion they would like CSW to send referral to [facility] and [facility]. [name] and Levander Campion are agreeable for patient to D/C back to [facility] if [facility] do not offer a bed. [name] discussed concerns about patient falling and asked about 1 on 1 care. CSW explained that no facility will provide 1 on 1 care however the family can hire a private duty sitter out of pocket to come sit with patient at the facility. Per [name] he will hire a private duty sitter because patient has had  that at home in the past. FL2 complete and faxed out.   [facility] and [facility] declined patient and CSW made [place] aware. Per [name] he will pay for a bed hold at [facility] and wants patient to return there. [facility] liaison is aware of above. CSW will continue to follow and assist as needed.    Employment status:  Retired Forensic scientist:  Medicare PT Recommendations:  Not assessed at this time Information / Referral to community resources:  [place]  Patient/Family's Response to care:  Patient's son and daughter are agreeable for patient to return to [facility].   Patient/Family's Understanding of and Emotional Response to Diagnosis, Current Treatment, and Prognosis:  Patient's son and daughter were very pleasant and thanked CSW for assistance.   Emotional Assessment Appearance:  Appears stated age Attitude/Demeanor/Rapport:  Unable to Assess Affect (typically observed):  Unable to Assess Orientation:  Oriented to Self, Fluctuating Orientation (Suspected and/or reported Sund[place]) Alcohol / Substance use:  Not Applicable Psych involvement (Current and /or in the community):  No (Comment)  Discharge Needs  Concerns to be addressed:  Discharge Planning Concerns Readmission within the last 30 days:  No Current discharge risk:  Dependent with Mobility, Cognitively Impaired, Chronically ill Barriers to Discharge:  Continued Medical Work up   [employer], Veronia Beets, LCSW 06/07/2018, 4:05 PM

## 2018-06-07 NOTE — ED Notes (Signed)
Patient has been cleaned. Adult incont. Pad changed. Patient was re-hooked up to monitor. Currently awaiting pick up to go to floor.

## 2018-06-07 NOTE — H&P (Signed)
[facility] [place] NAME: Tina [name]    MR#:  [id number]  DATE OF BIRTH:  [birth year]  DATE OF ADMISSION:  06/06/2018  PRIMARY CARE PHYSICIAN: Derinda Late, MD   REQUESTING/REFERRING PHYSICIAN:   CHIEF COMPLAINT:   Chief Complaint  Patient presents with  . Fall    HISTORY OF PRESENT ILLNESS: Tina [name]  is a 84 y.o. female with a known history of dementia, hypothyroidism and anxiety disorder. And is unable to provide reliable history due to dementia.  Information was taken from reviewing the medical records and from discussion with emergency room physician. She was transferred to emergency room for evaluation after a fall from standing height while at her facility.  The patient hit her head, and sustained skin tears to her right arm.  Patient does not remember the fall.  She denies any acute pain or other symptoms at this time, other than a cough. At the arrival to emergency room, patient was noted with hypothermia, tachycardia and tachypnea. Blood test done emergency room are notable for elevated WBC at 13.5 and elevated lactic acid level at 2.3.  UA is positive for UTI. No acute intracranial abnormalities per CT of the brain. She is admitted for further evaluation and treatment.  PAST MEDICAL HISTORY:   Past Medical History:  Diagnosis Date  . Anxiety   . Cancer ([place])    breast  . Dementia ([place])   . Hypothyroidism   . Shingles     PAST SURGICAL HISTORY:  Past Surgical History:  Procedure Laterality Date  . APPENDECTOMY    . BREAST SURGERY    . EYE SURGERY    . KYPHOPLASTY N/A 06/27/2015   Procedure: KYPHOPLASTY L4;  Surgeon: Hessie Knows, MD;  Location: [facility] ORS;  Service: Orthopedics;  Laterality: N/A;  . KYPHOPLASTY N/A 05/10/2018   Procedure: Manfred Arch;  Surgeon: Hessie Knows, MD;  Location: [facility] ORS;  Service: Orthopedics;  Laterality: N/A;  . ORIF WRIST FRACTURE Bilateral 03/24/2018   Procedure: OPEN  REDUCTION INTERNAL FIXATION (ORIF) WRIST FRACTURE;  Surgeon: Hessie Knows, MD;  Location: [facility] ORS;  Service: Orthopedics;  Laterality: Bilateral;    SOCIAL HISTORY:  Social History   Tobacco Use  . Smoking status: Current Every Day Smoker    Packs/day: 0.50  . Smokeless tobacco: Never Used  Substance Use Topics  . Alcohol use: No    FAMILY HISTORY:  Family History  Problem Relation Age of Onset  . CAD Mother   . CAD Father     DRUG ALLERGIES:  Allergies  Allergen Reactions  . Nsaids Other (See Comments)    H/O GI BLEED  . Lactose Intolerance (Gi)   . Meloxicam     Other reaction(s): Other (See Comments) Upper GI Bleeding    REVIEW OF SYSTEMS:   Unable to obtain, secondary to dementia.  MEDICATIONS AT HOME:  Prior to Admission medications   Medication Sig Start Date End Date Taking? Authorizing Provider  acetaminophen (TYLENOL) 325 MG tablet Take 650 mg by mouth 4 (four) times daily.   Yes [provider]  albuterol (ACCUNEB) 0.63 MG/3ML nebulizer solution Take 3 mLs by nebulization every 4 (four) hours as needed for wheezing or shortness of breath.   Yes [provider]  alendronate (FOSAMAX) 70 MG tablet Take 70 mg by mouth every Sunday.  02/12/17  Yes [provider]  aspirin EC 81 MG tablet Take 81 mg by mouth daily.   Yes [provider]  Calcium-Phosphorus-Vitamin D (CALCIUM/D3 ADULT GUMMIES PO) Take 1 tablet by mouth 2 (two) times daily. With lunch & supper    Yes [provider]  citalopram (CELEXA) 10 MG tablet Take 10 mg by mouth daily.   Yes [provider]  dicyclomine (BENTYL) 20 MG tablet Take 20 mg by mouth 4 (four) times daily.  03/09/18  Yes [provider]  donepezil (ARICEPT) 10 MG tablet Take 10 mg by mouth at bedtime.    Yes [provider]  feeding supplement, ENSURE ENLIVE, (ENSURE ENLIVE) LIQD Take 237 mLs by mouth 2 (two) times daily between meals. 05/27/18  Yes Pyreddy,  Reatha Harps, MD  fluticasone (FLONASE) 50 MCG/ACT nasal spray Place 2 sprays into both nostrils at bedtime.    Yes [provider]  furosemide (LASIX) 20 MG tablet Take 10 mg by mouth daily.   Yes [provider]  gabapentin (NEURONTIN) 600 MG tablet Take 600 mg by mouth 3 (three) times daily.    Yes [provider]  levothyroxine (SYNTHROID, LEVOTHROID) 112 MCG tablet Take 112 mcg by mouth daily before breakfast.    Yes [provider]  lidocaine (LIDODERM) 5 % Place 1 patch onto the skin daily. Remove & Discard patch within 12 hours or as directed by MD   Yes [provider]  Multiple Vitamin (MULTIVITAMIN WITH MINERALS) TABS tablet Take 1 tablet by mouth daily with lunch. Alive or Centrum Silver   Yes [provider]  pantoprazole (PROTONIX) 40 MG tablet Take 1 tablet (40 mg total) by mouth 2 (two) times daily. 06/23/17  Yes Vonda Antigua B, MD  potassium chloride SA (K-DUR,KLOR-CON) 20 MEQ tablet Take 20 mEq by mouth daily.   Yes [provider]  Saccharomyces boulardii (PROBIOTIC) 250 MG CAPS Take 250 mg by mouth 2 (two) times daily.   Yes [provider]  traMADol (ULTRAM) 50 MG tablet Take 1 tablet (50 mg total) by mouth every 8 (eight) hours as needed for moderate pain or severe pain. 05/30/18  Yes Sudini, Alveta Heimlich, MD  vitamin C (ASCORBIC ACID) 250 MG tablet Take 500 mg by mouth 2 (two) times daily.   Yes [provider]  acetaminophen (TYLENOL) 325 MG tablet Take 2 tablets (650 mg total) by mouth every 6 (six) hours as needed for mild pain (or Fever >/= 101). 05/18/17   Nicholes Mango, MD      PHYSICAL EXAMINATION:   VITAL SIGNS: Blood pressure (!) 137/122, pulse (!) 109, temperature 98.2 F (36.8 C), temperature source Oral, resp. rate (!) 25, height 5\' 3"  (1.6 m), weight 68.7 kg, SpO2 95 %.  GENERAL:  84 y.o.-year-old patient lying in the bed with no acute distress.  She looks confused, lethargic. EYES:  Pupils equal, round, reactive to light and accommodation. No scleral icterus. HEENT: Head atraumatic, normocephalic. Oropharynx and nasopharynx clear.  NECK:  Supple, no jugular venous distention. No thyroid enlargement, no tenderness.  LUNGS: Reduced breath sounds bilaterally, no wheezing, rales,rhonchi or crepitation. No use of accessory muscles of respiration.  CARDIOVASCULAR: S1, S2 normal. No S3/S4.  ABDOMEN: Soft, nontender, nondistended. Bowel sounds present. No organomegaly or mass.  EXTREMITIES: 2+ bilateral chronic pedal edema noted, no cyanosis, or clubbing.  NEUROLOGIC: Neurologic exam is limited, secondary to patient being confused and lethargic.  She is moving all her extremities, no focal weakness appreciated. PSYCHIATRIC: The patient is alert and oriented x 3.  SKIN: No obvious rash.  Right arm skin tear noted.  LABORATORY PANEL:  CBC Recent Labs  Lab 06/06/18 1935  WBC 13.5*  HGB 12.0  HCT 37.8  PLT 249  MCV 90.2  MCH 28.6  MCHC 31.7  RDW 19.7*  LYMPHSABS 1.3  MONOABS 1.2*  EOSABS 0.1  BASOSABS 0.0   ------------------------------------------------------------------------------------------------------------------  Chemistries  Recent Labs  Lab 06/06/18 1935  NA 137  K 4.1  CL 105  CO2 25  GLUCOSE 139*  BUN 12  CREATININE 0.96  CALCIUM 8.3*  AST 47*  ALT 27  ALKPHOS 342*  BILITOT 0.9   ------------------------------------------------------------------------------------------------------------------ estimated creatinine clearance is 37.7 mL/min (by C-G formula based on SCr of 0.96 mg/dL). ------------------------------------------------------------------------------------------------------------------ No results for input(s): TSH, T4TOTAL, T3FREE, THYROIDAB in the last 72 hours.  Invalid input(s): FREET3   Coagulation profile Recent Labs  Lab 06/06/18 2052  INR 1.29    ------------------------------------------------------------------------------------------------------------------- No results for input(s): DDIMER in the last 72 hours. -------------------------------------------------------------------------------------------------------------------  Cardiac Enzymes No results for input(s): CKMB, TROPONINI, MYOGLOBIN in the last 168 hours.  Invalid input(s): CK ------------------------------------------------------------------------------------------------------------------ Invalid input(s): POCBNP  ---------------------------------------------------------------------------------------------------------------  Urinalysis    Component Value Date/Time   COLORURINE AMBER (A) 06/06/2018 2005   APPEARANCEUR CLOUDY (A) 06/06/2018 2005   LABSPEC 1.017 06/06/2018 2005   PHURINE 5.0 06/06/2018 2005   GLUCOSEU NEGATIVE 06/06/2018 2005   HGBUR SMALL (A) 06/06/2018 2005   BILIRUBINUR NEGATIVE 06/06/2018 2005   [place] NEGATIVE 06/06/2018 2005   PROTEINUR 30 (A) 06/06/2018 2005   NITRITE NEGATIVE 06/06/2018 2005   LEUKOCYTESUR MODERATE (A) 06/06/2018 2005     RADIOLOGY: Ct Head Wo Contrast  Result Date: 06/06/2018 CLINICAL DATA:  84 y/o  F; head trauma after fall. EXAM: CT HEAD WITHOUT CONTRAST TECHNIQUE: Contiguous axial images were obtained from the base of the skull through the vertex without intravenous contrast. COMPARISON:  05/23/2018 CT head. FINDINGS: Brain: No evidence of acute infarction, hemorrhage, hydrocephalus, extra-axial collection or mass lesion/mass effect. Stable bilateral subdural hygroma. Stable chronic microvascular ischemic changes and volume loss of the brain. Asymmetric volume loss of the left anterior temporal lobe. Vascular: Calcific atherosclerosis of carotid siphons. No hyperdense vessel identified. Skull: Mild left frontal scalp contusion. No calvarial fracture. Sinuses/Orbits: No acute finding. Other: None. IMPRESSION: 1.  Small left frontal scalp contusion. 2. No acute intracranial abnormality or calvarial fracture. 3. Stable small bifrontal hygromas. Stable chronic microvascular ischemic changes and volume loss of the brain. 4. Stable asymmetric volume loss of left anterior temporal lobe, possibly sequelae of prior trauma, stroke, or neurodegenerative disorder. Electronically Signed   By: Kristine Garbe M.D.   On: 06/06/2018 20:00   Dg Chest Portable 1 View  Result Date: 06/06/2018 CLINICAL DATA:  Cough for several days EXAM: PORTABLE CHEST 1 VIEW COMPARISON:  05/28/2018 FINDINGS: Haziness at the left base that is stable from prior and attributed to soft tissue attenuation in this patient with breast implants. Low volumes with asymmetric left diaphragm elevation, stable. Normal heart size. Stable aortic tortuosity. Vertebroplasty. IMPRESSION: Stable from prior.  No evidence of acute disease. Electronically Signed   By: Monte Fantasia M.D.   On: 06/06/2018 19:50    EKG: Orders placed or performed during the hospital encounter of 05/28/18  . ED EKG  . ED EKG  . EKG 12-Lead  . EKG 12-Lead    IMPRESSION AND PLAN:  1.  Sepsis likely secondary to UTI.  We will start treatment with IV antibiotic, Rocephin.  Will follow lactic acid level and urine and blood cultures results. 2.  Acute UTI, see treatment as  above under #1. 3.  Acute encephalopathy, likely metabolic secondary to UTI/sepsis in a patient with chronic dementia.  To need to monitor clinically closely while treating the underlying disorder. 4.  CHF, currently clinically compensated.  Will monitor patient closely and limit IV fluids as much as possible.  Resume maintenance medical treatment. 5.  Hypothyroidism, continue supplemental Synthroid.  All the records are reviewed and case discussed with ED provider. Management plans discussed with the patient, family and they are in agreement.  CODE STATUS: Full Code Status History    Date Active  Date Inactive Code Status Order ID Comments User Context   05/27/2018 1039 05/27/2018 1445 Full Code [id number]  Saundra Shelling, MD Inpatient   05/27/2018 0951 05/27/2018 1039 DNR [id number]  Saundra Shelling, MD Inpatient   05/25/2018 1823 05/27/2018 0951 Full Code [id number]  Hillary Bow, MD Inpatient   05/25/2018 1213 05/25/2018 1823 DNR [id number]  Jimmy Footman, NP Inpatient   05/23/2018 1544 05/25/2018 1213 Full Code [id number]  Loletha Grayer, MD ED   05/10/2018 1807 05/11/2018 1805 DNR [id number]  Hessie Knows, MD Inpatient   05/10/2018 1631 05/10/2018 1807 Full Code [id number]  Hessie Knows, MD Inpatient   03/23/2018 2050 03/26/2018 1705 Full Code [id number]  Loletha Grayer, MD ED   05/15/2017 1451 05/18/2017 1446 Full Code [id number]  Idelle Crouch, MD Inpatient   06/27/2015 1557 06/27/2015 1932 Full Code [id number]  Hessie Knows, MD Inpatient    Advance Directive Documentation     Most Recent Value  Type of Advance Directive  Healthcare Power of [place], Out of facility DNR (pink MOST or yellow form)  Pre-existing out of facility DNR order (yellow form or pink MOST form)  Pink MOST form placed in chart (order not valid for inpatient use)  "MOST" Form in Place?  -       TOTAL TIME TAKING CARE OF THIS PATIENT: 50 minutes.    Amelia Jo M.D on 06/07/2018 at 1:53 AM  Between 7am to 6pm - Pager - [phone]  After 6pm go to [email or web address] - password [email or web address] [facility] [place] [facility]  [phone]  CC: Primary care physician; Derinda Late, MD

## 2018-06-07 NOTE — ED Notes (Signed)
RN not available for report. Will call back in 15 min.

## 2018-06-07 NOTE — NC FL2 (Signed)
[place] LEVEL OF CARE SCREENING TOOL     IDENTIFICATION  Patient Name: Tina [name] Birthdate: Jul 25, 1927 Sex: female Admission Date (Current Location): 06/06/2018  [place] and [place] Number:  Engineering geologist and Address:  [facility], [address] [place], [place] [address]      Provider Number: [id number]  Attending Physician Name and Address:  Max Sane, MD  Relative Name and Phone Number:       Current Level of Care: Hospital Recommended Level of Care: [place] Prior Approval Number:    Date Approved/Denied:   PASRR Number: [id number])  Discharge Plan: SNF    Current Diagnoses: Patient Active Problem List   Diagnosis Date Noted  . Sepsis ([place]) 06/06/2018  . Hypoxia 05/28/2018  . Pressure injury of skin 05/24/2018  . Calculus of gallbladder without cholecystitis without obstruction   . Acute kidney injury ([place]) 05/23/2018  . Status post kyphoplasty 05/10/2018  . Wrist fracture, bilateral 03/23/2018  . Dementia in Alzheimer's disease ([place]) 05/24/2017  . GI bleed 05/15/2017  . Acute blood loss anemia 05/15/2017  . Acute encephalopathy 05/15/2017  . Tachycardia 05/15/2017  . AMD (age related macular degeneration) 01/03/2015  . Allergic rhinitis 12/12/2013  . Depressive disorder, not elsewhere classified 12/12/2013  . Hypothyroidism 12/12/2013  . Postherpetic neuralgia 12/12/2013    Orientation RESPIRATION BLADDER Height & Weight     Self  Normal Continent Weight: 149 lb (67.6 kg) Height:  5\' 3"  (160 cm)  BEHAVIORAL SYMPTOMS/MOOD NEUROLOGICAL BOWEL NUTRITION STATUS      Continent Diet(Diet: Heart Healthy )  AMBULATORY STATUS COMMUNICATION OF NEEDS Skin   Extensive Assist Verbally PU Stage and Appropriate Care(Pressure Ulcer Stage 1 on buttocks.  )                       Personal Care Assistance Level of Assistance  Bathing, Feeding, Dressing Bathing Assistance: Limited  assistance Feeding assistance: Independent Dressing Assistance: Limited assistance     Functional Limitations Info  Sight, Hearing, Speech Sight Info: Adequate Hearing Info: Adequate Speech Info: Adequate    SPECIAL CARE FACTORS FREQUENCY  PT (By licensed PT), OT (By licensed OT)     PT Frequency: (5) OT Frequency: (5)            Contractures      Additional Factors Info  Code Status, Allergies Code Status Info: (Full Code. ) Allergies Info: (Nsaids, Lactose Intolerance (Gi), Meloxicam)           Current Medications (06/07/2018):  This is the current hospital active medication list Current Facility-Administered Medications  Medication Dose Route Frequency Provider Last Rate Last Dose  . acetaminophen (TYLENOL) tablet 650 mg  650 mg Oral Q6H PRN Amelia Jo, MD       Or  . acetaminophen (TYLENOL) suppository 650 mg  650 mg Rectal Q6H PRN Amelia Jo, MD      . albuterol (PROVENTIL) (2.5 MG/3ML) 0.083% nebulizer solution 3 mL  3 mL Nebulization Q4H PRN Amelia Jo, MD      . aspirin EC tablet 81 mg  81 mg Oral Daily Amelia Jo, MD   81 mg at 06/07/18 1021  . bisacodyl (DULCOLAX) EC tablet 5 mg  5 mg Oral Daily PRN Amelia Jo, MD      . calcium-vitamin D (OSCAL WITH D) 500-200 MG-UNIT per tablet 1 tablet  1 tablet Oral BID Amelia Jo, MD   1 tablet at 06/07/18 1021  .  cefTRIAXone (ROCEPHIN) 1 g in sodium chloride 0.9 % 100 mL IVPB  1 g Intravenous Q24H Amelia Jo, MD      . citalopram (CELEXA) tablet 10 mg  10 mg Oral Daily Amelia Jo, MD   10 mg at 06/07/18 1021  . dicyclomine (BENTYL) tablet 20 mg  20 mg Oral QID Amelia Jo, MD   20 mg at 06/07/18 1021  . docusate sodium (COLACE) capsule 100 mg  100 mg Oral BID Amelia Jo, MD   100 mg at 06/07/18 1022  . donepezil (ARICEPT) tablet 10 mg  10 mg Oral QHS Amelia Jo, MD      . feeding supplement (ENSURE ENLIVE) (ENSURE ENLIVE) liquid 237 mL  237 mL Oral BID BM Amelia Jo, MD      .  fluticasone Asencion Islam) 50 MCG/ACT nasal spray 2 spray  2 spray Each Nare QHS Amelia Jo, MD      . gabapentin (NEURONTIN) capsule 600 mg  600 mg Oral TID Amelia Jo, MD   600 mg at 06/07/18 1021  . heparin injection 5,000 Units  5,000 Units Subcutaneous Q8H Amelia Jo, MD   5,000 Units at 06/07/18 0548  . levothyroxine (SYNTHROID, LEVOTHROID) tablet 112 mcg  112 mcg Oral QAC breakfast Amelia Jo, MD      . lidocaine (LIDODERM) 5 % 1 patch  1 patch Transdermal Q24H Amelia Jo, MD   1 patch at 06/07/18 1027  . multivitamin with minerals tablet 1 tablet  1 tablet Oral Q lunch Amelia Jo, MD      . ondansetron [facility]) tablet 4 mg  4 mg Oral Q6H PRN Amelia Jo, MD       Or  . ondansetron [facility]) injection 4 mg  4 mg Intravenous Q6H PRN Amelia Jo, MD      . pantoprazole (PROTONIX) EC tablet 40 mg  40 mg Oral BID Amelia Jo, MD   40 mg at 06/07/18 1022  . potassium chloride SA (K-DUR,KLOR-CON) CR tablet 20 mEq  20 mEq Oral Daily Amelia Jo, MD   20 mEq at 06/07/18 1021  . saccharomyces boulardii (FLORASTOR) capsule 250 mg  250 mg Oral BID Amelia Jo, MD   250 mg at 06/07/18 1021  . traMADol (ULTRAM) tablet 50 mg  50 mg Oral Q8H PRN Amelia Jo, MD      . traZODone (DESYREL) tablet 25 mg  25 mg Oral QHS PRN Amelia Jo, MD      . vitamin C (ASCORBIC ACID) tablet 500 mg  500 mg Oral BID Amelia Jo, MD   500 mg at 06/07/18 1022     Discharge Medications: Please see discharge summary for a list of discharge medications.  Relevant Imaging Results:  Relevant Lab Results:   Additional Information (SSN: [id number])  Tramain Gershman, Veronia Beets, LCSW

## 2018-06-07 NOTE — ED Notes (Signed)
Report given to to [facility] on 2A.

## 2018-06-08 LAB — CBC
HCT: 31.3 % — ABNORMAL LOW (ref 36.0–46.0)
Hemoglobin: 9.8 g/dL — ABNORMAL LOW (ref 12.0–15.0)
MCH: 28.6 pg (ref 26.0–34.0)
MCHC: 31.3 g/dL (ref 30.0–36.0)
MCV: 91.3 fL (ref 80.0–100.0)
Platelets: 197 10*3/uL (ref 150–400)
RBC: 3.43 MIL/uL — ABNORMAL LOW (ref 3.87–5.11)
RDW: 19.9 % — ABNORMAL HIGH (ref 11.5–15.5)
WBC: 9.7 10*3/uL (ref 4.0–10.5)
nRBC: 0 % (ref 0.0–0.2)

## 2018-06-08 LAB — GLUCOSE, CAPILLARY: Glucose-Capillary: 84 mg/dL (ref 70–99)

## 2018-06-08 LAB — BASIC METABOLIC PANEL
Anion gap: 6 (ref 5–15)
BUN: 12 mg/dL (ref 8–23)
CO2: 25 mmol/L (ref 22–32)
CREATININE: 0.82 mg/dL (ref 0.44–1.00)
Calcium: 8.1 mg/dL — ABNORMAL LOW (ref 8.9–10.3)
Chloride: 111 mmol/L (ref 98–111)
GFR calc Af Amer: >60 mL/min (ref >60)
GFR calc non Af Amer: >60 mL/min (ref >60)
Glucose, Bld: 93 mg/dL (ref 70–99)
Potassium: 4.5 mmol/L (ref 3.5–5.1)
Sodium: 142 mmol/L (ref 135–145)

## 2018-06-08 MED ORDER — CEPHALEXIN 250 MG PO CAPS: 250.0000 mg | ORAL_CAPSULE | Freq: Two times a day (BID) | ORAL | 0 refills | Status: AC

## 2018-06-08 MED ORDER — LIDOCAINE 5 % EX PTCH: 1.0000 | MEDICATED_PATCH | CUTANEOUS | 0 refills | Status: DC

## 2018-06-08 NOTE — Progress Notes (Signed)
Referral for outpatient Palliative to follow at [facility] received from [place]. Plan is for discharge today. Please note patient had a previous referral from past admission. Order at facility is in place. Updated patient information faxed to referral.  Flo Shanks RN, BSN,. [facility], hospital Liaison [phone]

## 2018-06-08 NOTE — Progress Notes (Signed)
Patient is medically stable for D/C back to [facility] today with outpatient palliative. Per Otila Kluver [facility] liaison patient can return today to room 503. RN will call report and arrange EMS for transport. Clinical Education officer, museum (CSW) sent D/C orders to [facility] via [facility]. Patient's son [name] is aware of above and in agreement with outpatient palliative. Per [name] he has hired private duty sitters to start at [facility] from 9 am to 6 pm. [facility] is aware of above. Please reconsult if future social work needs arise. CSW signing off.   [employer], LCSW [phone]

## 2018-06-08 NOTE — Progress Notes (Signed)
Called report to [place] [facility]. Answered all questions. EMS called for transport

## 2018-06-08 NOTE — Discharge Summary (Signed)
[facility] [place] NAME: Tina [name]    MR#:  [id number]  DATE OF BIRTH:  [birth year]  DATE OF ADMISSION:  06/06/2018   ADMITTING PHYSICIAN: Amelia Jo, MD  DATE OF DISCHARGE: 06/08/2018  PRIMARY CARE PHYSICIAN: Derinda Late, MD   ADMISSION DIAGNOSIS:  Injury of head, initial encounter [S09.90XA] Laceration of scalp, initial encounter [S01.01XA] Urinary tract infection without hematuria, site unspecified [N39.0] DISCHARGE DIAGNOSIS:  Active Problems:   Sepsis ([place])  SECONDARY DIAGNOSIS:   Past Medical History:  Diagnosis Date  . Anxiety   . Cancer ([place])    breast  . Dementia ([place])   . Hypothyroidism   . Shingles    HOSPITAL COURSE:  1.Sepsis likely secondary to UTI - resolved now with Abx 2.Acute UTI: based on UA, improving with Abx 3.Acute encephalopathy,likely metabolic secondary to UTI/sepsis in a patient with chronic dementia. monitor 4.CHF: well compensated 5.Hypothyroidism, continue supplemental Synthroid. normal TSH DISCHARGE CONDITIONS:  stable CONSULTS OBTAINED:   DRUG ALLERGIES:   Allergies  Allergen Reactions  . Nsaids Other (See Comments)    H/O GI BLEED  . Lactose Intolerance (Gi)   . Meloxicam     Other reaction(s): Other (See Comments) Upper GI Bleeding   DISCHARGE MEDICATIONS:   Allergies as of 06/08/2018      Reactions   Nsaids Other (See Comments)   H/O GI BLEED   Lactose Intolerance (gi)    Meloxicam    Other reaction(s): Other (See Comments) Upper GI Bleeding      Medication List    TAKE these medications   acetaminophen 325 MG tablet Commonly known as:  TYLENOL Take 650 mg by mouth 4 (four) times daily.   acetaminophen 325 MG tablet Commonly known as:  TYLENOL Take 2 tablets (650 mg total) by mouth every 6 (six) hours as needed for mild pain (or Fever >/= 101).   albuterol 0.63 MG/3ML nebulizer solution Commonly known as:  ACCUNEB Take 3 mLs by  nebulization every 4 (four) hours as needed for wheezing or shortness of breath.   alendronate 70 MG tablet Commonly known as:  FOSAMAX Take 70 mg by mouth every Sunday.   aspirin EC 81 MG tablet Take 81 mg by mouth daily.   CALCIUM/D3 ADULT GUMMIES PO Take 1 tablet by mouth 2 (two) times daily. With lunch & supper   cephALEXin 250 MG capsule Commonly known as:  KEFLEX Take 1 capsule (250 mg total) by mouth 2 (two) times daily for 2 days.   citalopram 10 MG tablet Commonly known as:  CELEXA Take 10 mg by mouth daily.   dicyclomine 20 MG tablet Commonly known as:  BENTYL Take 20 mg by mouth 4 (four) times daily.   donepezil 10 MG tablet Commonly known as:  ARICEPT Take 10 mg by mouth at bedtime.   feeding supplement (ENSURE ENLIVE) Liqd Take 237 mLs by mouth 2 (two) times daily between meals.   fluticasone 50 MCG/ACT nasal spray Commonly known as:  FLONASE Place 2 sprays into both nostrils at bedtime.   furosemide 20 MG tablet Commonly known as:  LASIX Take 10 mg by mouth daily.   gabapentin 600 MG tablet Commonly known as:  NEURONTIN Take 600 mg by mouth 3 (three) times daily.   levothyroxine 112 MCG tablet Commonly known as:  SYNTHROID, LEVOTHROID Take 112 mcg by mouth daily before breakfast.   lidocaine 5 % Commonly known as:  LIDODERM Place 1 patch onto the skin  daily. Remove & Discard patch within 12 hours or as directed by MD   multivitamin with minerals Tabs tablet Take 1 tablet by mouth daily with lunch. Alive or Centrum Silver   pantoprazole 40 MG tablet Commonly known as:  PROTONIX Take 1 tablet (40 mg total) by mouth 2 (two) times daily.   potassium chloride SA 20 MEQ tablet Commonly known as:  K-DUR,KLOR-CON Take 20 mEq by mouth daily.   Probiotic 250 MG Caps Take 250 mg by mouth 2 (two) times daily.   traMADol 50 MG tablet Commonly known as:  ULTRAM Take 1 tablet (50 mg total) by mouth every 8 (eight) hours as needed for moderate pain or  severe pain.   vitamin C 250 MG tablet Commonly known as:  ASCORBIC ACID Take 500 mg by mouth 2 (two) times daily.      DISCHARGE INSTRUCTIONS:   DIET:  Regular diet DISCHARGE CONDITION:  Good ACTIVITY:  Activity as tolerated OXYGEN:  Home Oxygen: No.  Oxygen Delivery: room air DISCHARGE LOCATION:  nursing home - Palliative care to follow. Consider Hospice if she qualifies.  If you experience worsening of your admission symptoms, develop shortness of breath, life threatening emergency, suicidal or homicidal thoughts you must seek medical attention immediately by calling 911 or calling your MD immediately  if symptoms less severe.  You Must read complete instructions/literature along with all the possible adverse reactions/side effects for all the Medicines you take and that have been prescribed to you. Take any new Medicines after you have completely understood and accpet all the possible adverse reactions/side effects.   Please note  You were cared for by a hospitalist during your hospital stay. If you have any questions about your discharge medications or the care you received while you were in the hospital after you are discharged, you can call the unit and asked to speak with the hospitalist on call if the hospitalist that took care of you is not available. Once you are discharged, your primary care physician will handle any further medical issues. Please note that NO REFILLS for any discharge medications will be authorized once you are discharged, as it is imperative that you return to your primary care physician (or establish a relationship with a primary care physician if you do not have one) for your aftercare needs so that they can reassess your need for medications and monitor your lab values.    On the day of Discharge:  VITAL SIGNS:  Blood pressure 96/60, pulse 79, temperature (!) 97.5 F (36.4 C), temperature source Oral, resp. rate 16, height 5\' 3"  (1.6 m), weight  72.6 kg, SpO2 95 %. PHYSICAL EXAMINATION:  GENERAL:  84 y.o.-year-old patient lying in the bed with no acute distress.  EYES: Pupils equal, round, reactive to light and accommodation. No scleral icterus. Extraocular muscles intact.  HEENT: Head atraumatic, normocephalic. Oropharynx and nasopharynx clear.  NECK:  Supple, no jugular venous distention. No thyroid enlargement, no tenderness.  LUNGS: Normal breath sounds bilaterally, no wheezing, rales,rhonchi or crepitation. No use of accessory muscles of respiration.  CARDIOVASCULAR: S1, S2 normal. No murmurs, rubs, or gallops.  ABDOMEN: Soft, non-tender, non-distended. Bowel sounds present. No organomegaly or mass.  EXTREMITIES: No pedal edema, cyanosis, or clubbing.  NEUROLOGIC: Cranial nerves II through XII are intact. Muscle strength 5/5 in all extremities. Sensation intact. Gait not checked.  PSYCHIATRIC: The patient is at her baseline mental status (dementia) SKIN: No obvious rash, lesion, or ulcer.  DATA REVIEW:   CBC  Recent Labs  Lab 06/08/18 0415  WBC 9.7  HGB 9.8*  HCT 31.3*  PLT 197    Chemistries  Recent Labs  Lab 06/06/18 1935  06/08/18 0415  NA 137   < > 142  K 4.1   < > 4.5  CL 105   < > 111  CO2 25   < > 25  GLUCOSE 139*   < > 93  BUN 12   < > 12  CREATININE 0.96   < > 0.82  CALCIUM 8.3*   < > 8.1*  AST 47*  --   --   ALT 27  --   --   ALKPHOS 342*  --   --   BILITOT 0.9  --   --    < > = values in this interval not displayed.      Contact information for follow-up providers    Derinda Late, MD. Schedule an appointment as soon as possible for a visit in 5 day(s).   Specialty:  Family Medicine Contact information: [address] [facility] [place] [address] [phone]            Contact information for after-discharge care    Destination    [facility] Preferred SNF .   Service:  Skilled Nursing Contact information: [address] [place] [phone]                   Management plans discussed with the patient, family (left message for son but no response, informed [facility] - csw who had d/w son y'day and he was in agreement with getting her back to facility today)and they are in agreement.  CODE STATUS: Full Code   TOTAL TIME TAKING CARE OF THIS PATIENT: 45 minutes.    Max Sane M.D on 06/08/2018 at 10:43 AM  Between 7am to 6pm - Pager - [phone]  After 6pm go to [email or web address] - Proofreader  [facility]  Office  [phone]  CC: Primary care physician; Derinda Late, MD   Note: This dictation was prepared with Dragon dictation along with smaller phrase technology. Any transcriptional errors that result from this process are unintentional.

## 2018-06-10 ENCOUNTER — Encounter: Payer: Self-pay | Admitting: Nurse Practitioner

## 2018-06-10 ENCOUNTER — Non-Acute Institutional Stay: Payer: Medicare Other | Admitting: Nurse Practitioner

## 2018-06-10 VITALS — HR 78 | Temp 97.8°F | Resp 20

## 2018-06-10 DIAGNOSIS — R531 Weakness: Secondary | ICD-10-CM | POA: Insufficient documentation

## 2018-06-10 DIAGNOSIS — Z515 Encounter for palliative care: Secondary | ICD-10-CM | POA: Insufficient documentation

## 2018-06-10 DIAGNOSIS — R630 Anorexia: Secondary | ICD-10-CM

## 2018-06-10 DIAGNOSIS — R413 Other amnesia: Secondary | ICD-10-CM | POA: Insufficient documentation

## 2018-06-10 NOTE — Progress Notes (Signed)
[facility] Telephone: [phone] Fax: [phone]  PATIENT NAME: Tina [name] DOB: 08/24/1927 MRN: [id number]  PRIMARY CARE PROVIDER:   Derinda Late, MD  REFERRING PROVIDER:  Dr Lovie Macadamia; [facility] RESPONSIBLE PARTY:   daughter Lucio Edward at [phone] or son Kamoni Depree at [phone]   ASSESSMENT:     I visited and observed Ms Yard. Explain to [place], family friend purpose for palliative medicine visit. We talked about how Ms Linney is doing today. She felt like she ate a little bit better and Her speech may be a little more clear. She talked about since the fall it's been difficult to understand her and her confusion has been worse. She talked about mashing up her food more and giving her ensure as well but notices that she has been having more difficulty with swallowing. We talked about Ms. Formoso living independently prior to hospitalization. Ms. Koppel slept through palliative medicine assessment, briefly opening her eyes. She does appear elderly but overall comfortable at present time. Emotional support provided. I attempted to contact her daughter, Levander Campion and Dianne's daughter answered the phone. She verbalized that she was not available and best to contact [place], Ms. Saathoff son. I called and left a message for [name], Ms. Ayer's son to return call.   RECOMMENDATIONS and PLAN:   1.Palliative care encounter Z51.5; Palliative medicine team will continue to support patient, patient's family, and medical team. Visit consisted of counseling and education dealing with the complex and emotionally intense issues of symptom management and palliative care in the setting of serious and potentially life-threatening illness  2. Memory loss R41.3 secondary to dementia appears progressive. Medical goals to continue to focus on Comfort, redirecting with supportive measures.  3. Generalized weakness R53.1 secondary to Dementia disease continue with therapy as able.  Encourage energy conservation and rest times.  4. Anorexia R63.0 secondary to dementia appetite poor Continue to encourage supplements and comfort feedings.   I spent 75 minutes providing this consultation,  from 1:00pm to 2:30pm. More than 50% of the time in this consultation was spent coordinating communication.   HISTORY OF PRESENT ILLNESS:  CAROLY [name] is a 84 y.o. year old female with multiple medical problems including dementia, breast cancer s/p sgy, hypothyroidism, history of shingles, anxiety, bilateral wrist fractures s/p ORIF, kyphoplasty, appendectomy. Hospitalize 9 / 25/ 2019 to 9 / 27 / 2019 after a fall with bilateral wrist fractures s/p ORIF. Hospitalized 11 / 12 / 2019 to 72 / 13 / 2019 for T8 compression fracture requiring kyphoplasty. Hospitalized 11 / 25 / 2018 to 29 / 29 / 2019 for  a fall with increased confusion, 20 pound weight loss, diarrhea, difficulty urinating with episodes of vomiting. Workup significant for 3 rib fractures, acute kidney injury evaluated by surgery for cholelithiasis so no evidence of cholecystitis. Her documentation  poor surgical candidate due to multiple comorbidities. She was discharged home. Palliative medicine did fall during hospitalization. Poor documentation  discussion family was hopeful and would like her to return home with 24 hour caregivers. Discussed code status and patients wishes are for DNR/DNI and does not want any form of artificial feeding such as a peg tube placement. Most form was not completed at that time as daughter wished for her brother to review it as well. 11 /  30 / 2019 was hospitalized after being visited by [facility] and found her where she was screaming in pain . She was found to be hypoxic with OT saturation 88%  with mild pulmonary vascular congestion requiring Lasix. She was discharged to short-term rehab. 12 / 9 / 2019 to 12 / 11 / 2019 with sepsis secondary to urinary tract infection, injury to head with scalp laceration.  She did receive antibiotic therapy. Acute encephalopathy likely metabolic secondary to sepsis, UTI in the city of chronic dementia. Congestive heart failure compensated. Hypothyroidism continued on Synthroid. She was discharged back to short-term rehab where she currently resides. Followed by primary with [place] visit 12/12 / 2019 where she continued on Keflex for UTI. Lidoderm patches used for pain management for history of rib fractures in addition to Tylenol and or Tramadol is needed for breakthrough pain. She does take Aricept for dementia. Recommended to stay on Lasix and use compression stockings for edema. Melatonin was added to help for sleep. Since she is returned to short-term rehab at [facility] she has been cited progress. She has required significant amount of assistance for staff transferring in adl's. She does require assistance with feeding with very little oral intake. She has been sleeping more. Staff endorses she has been more confused. At present she is lying in bed asleep. She appears elderly but comfortable. Abundio Miu a family friend is at bedside. Palliative Care was asked to help address goals of care.   CODE STATUS: full code  PPS: 30% HOSPICE ELIGIBILITY/DIAGNOSIS: possible with clinical presentation <6 months  PAST MEDICAL HISTORY:  Past Medical History:  Diagnosis Date  . Anxiety   . Cancer ([place])    breast  . Dementia ([place])   . Hypothyroidism   . Shingles     SOCIAL HX:  Social History   Tobacco Use  . Smoking status: Current Every Day Smoker    Packs/day: 0.50  . Smokeless tobacco: Never Used  Substance Use Topics  . Alcohol use: No    ALLERGIES:  Allergies  Allergen Reactions  . Nsaids Other (See Comments)    H/O GI BLEED  . Lactose Intolerance (Gi)   . Meloxicam     Other reaction(s): Other (See Comments) Upper GI Bleeding     PERTINENT MEDICATIONS:  Outpatient Encounter Medications as of 82/13/2019  Medication Sig  . acetaminophen (TYLENOL) 325  MG tablet Take 2 tablets (650 mg total) by mouth every 6 (six) hours as needed for mild pain (or Fever >/= 101).  Marland Kitchen acetaminophen (TYLENOL) 325 MG tablet Take 650 mg by mouth 4 (four) times daily.  Marland Kitchen albuterol (ACCUNEB) 0.63 MG/3ML nebulizer solution Take 3 mLs by nebulization every 4 (four) hours as needed for wheezing or shortness of breath.  Marland Kitchen alendronate (FOSAMAX) 70 MG tablet Take 70 mg by mouth every Sunday.   Marland Kitchen aspirin EC 81 MG tablet Take 81 mg by mouth daily.  . Calcium-Phosphorus-Vitamin D (CALCIUM/D3 ADULT GUMMIES PO) Take 1 tablet by mouth 2 (two) times daily. With lunch & supper   . cephALEXin (KEFLEX) 250 MG capsule Take 1 capsule (250 mg total) by mouth 2 (two) times daily for 2 days.  . citalopram (CELEXA) 10 MG tablet Take 10 mg by mouth daily.  Marland Kitchen dicyclomine (BENTYL) 20 MG tablet Take 20 mg by mouth 4 (four) times daily.   Marland Kitchen donepezil (ARICEPT) 10 MG tablet Take 10 mg by mouth at bedtime.   . feeding supplement, ENSURE ENLIVE, (ENSURE ENLIVE) LIQD Take 237 mLs by mouth 2 (two) times daily between meals.  . fluticasone (FLONASE) 50 MCG/ACT nasal spray Place 2 sprays into both nostrils at bedtime.   . furosemide (LASIX) 20 MG  tablet Take 10 mg by mouth daily.  Marland Kitchen gabapentin (NEURONTIN) 600 MG tablet Take 600 mg by mouth 3 (three) times daily.   Marland Kitchen levothyroxine (SYNTHROID, LEVOTHROID) 112 MCG tablet Take 112 mcg by mouth daily before breakfast.   . lidocaine (LIDODERM) 5 % Place 1 patch onto the skin daily. Remove & Discard patch within 12 hours or as directed by MD  . Multiple Vitamin (MULTIVITAMIN WITH MINERALS) TABS tablet Take 1 tablet by mouth daily with lunch. Alive or Dance movement psychotherapist  . pantoprazole (PROTONIX) 40 MG tablet Take 1 tablet (40 mg total) by mouth 2 (two) times daily.  . potassium chloride SA (K-DUR,KLOR-CON) 20 MEQ tablet Take 20 mEq by mouth daily.  . Saccharomyces boulardii (PROBIOTIC) 250 MG CAPS Take 250 mg by mouth 2 (two) times daily.  . traMADol (ULTRAM) 50  MG tablet Take 1 tablet (50 mg total) by mouth every 8 (eight) hours as needed for moderate pain or severe pain.  . vitamin C (ASCORBIC ACID) 250 MG tablet Take 500 mg by mouth 2 (two) times daily.   No facility-administered encounter medications on file as of 82/13/2019.     PHYSICAL EXAM:   General: NAD, frail appearing, thin, lethargic female with temporal wasting bilaterally Cardiovascular: regular rate and rhythm Pulmonary: clear ant fields Abdomen: soft, nontender, + bowel sounds GU: no suprapubic tenderness Extremities: +BLE edema, no joint deformities Skin: no rashes Neurological: Weakness but otherwise nonfocal  Jerol Rufener Ihor Gully, NP

## 2018-06-11 LAB — CULTURE, BLOOD (ROUTINE X 2)
Culture: NO GROWTH
Culture: NO GROWTH
Special Requests: ADEQUATE
Special Requests: ADEQUATE

## 2018-06-13 ENCOUNTER — Emergency Department: Payer: Medicare Other

## 2018-06-13 ENCOUNTER — Encounter: Payer: Self-pay | Admitting: Emergency Medicine

## 2018-06-13 ENCOUNTER — Other Ambulatory Visit: Payer: Self-pay

## 2018-06-13 ENCOUNTER — Emergency Department
Admission: EM | Admit: 2018-06-13 | Discharge: 2018-06-13 | Disposition: A | Discharge status: Home / Self Care | Payer: Medicare Other | Attending: Emergency Medicine | Admitting: Emergency Medicine

## 2018-06-13 DIAGNOSIS — Y939 Activity, unspecified: Secondary | ICD-10-CM | POA: Diagnosis not present

## 2018-06-13 DIAGNOSIS — W1830XA Fall on same level, unspecified, initial encounter: Secondary | ICD-10-CM | POA: Diagnosis not present

## 2018-06-13 DIAGNOSIS — Y999 Unspecified external cause status: Secondary | ICD-10-CM | POA: Diagnosis not present

## 2018-06-13 DIAGNOSIS — F17210 Nicotine dependence, cigarettes, uncomplicated: Secondary | ICD-10-CM | POA: Insufficient documentation

## 2018-06-13 DIAGNOSIS — S0083XA Contusion of other part of head, initial encounter: Secondary | ICD-10-CM | POA: Diagnosis present

## 2018-06-13 DIAGNOSIS — Z853 Personal history of malignant neoplasm of breast: Secondary | ICD-10-CM | POA: Insufficient documentation

## 2018-06-13 DIAGNOSIS — Y92129 Unspecified place in nursing home as the place of occurrence of the external cause: Secondary | ICD-10-CM | POA: Insufficient documentation

## 2018-06-13 DIAGNOSIS — Z79899 Other long term (current) drug therapy: Secondary | ICD-10-CM | POA: Insufficient documentation

## 2018-06-13 DIAGNOSIS — E039 Hypothyroidism, unspecified: Secondary | ICD-10-CM | POA: Diagnosis not present

## 2018-06-13 DIAGNOSIS — R296 Repeated falls: Secondary | ICD-10-CM | POA: Diagnosis not present

## 2018-06-13 DIAGNOSIS — W19XXXA Unspecified fall, initial encounter: Secondary | ICD-10-CM

## 2018-06-13 DIAGNOSIS — F0390 Unspecified dementia without behavioral disturbance: Secondary | ICD-10-CM | POA: Insufficient documentation

## 2018-06-13 LAB — URINALYSIS, COMPLETE (UACMP) WITH MICROSCOPIC
Bacteria, UA: NONE SEEN
Bilirubin Urine: NEGATIVE
GLUCOSE, UA: NEGATIVE mg/dL
HGB URINE DIPSTICK: NEGATIVE
Ketones, ur: NEGATIVE mg/dL
Leukocytes, UA: NEGATIVE
NITRITE: NEGATIVE
Protein, ur: NEGATIVE mg/dL
Specific Gravity, Urine: 1.024 (ref 1.005–1.030)
pH: 5 (ref 5.0–8.0)

## 2018-06-13 LAB — CBC WITH DIFFERENTIAL/PLATELET
Abs Immature Granulocytes: 0.07 10*3/uL (ref 0.00–0.07)
Basophils Absolute: 0.1 10*3/uL (ref 0.0–0.1)
Basophils Relative: 1 %
Eosinophils Absolute: 0.8 10*3/uL — ABNORMAL HIGH (ref 0.0–0.5)
Eosinophils Relative: 7 %
HCT: 37.6 % (ref 36.0–46.0)
Hemoglobin: 11.9 g/dL — ABNORMAL LOW (ref 12.0–15.0)
Immature Granulocytes: 1 %
LYMPHS PCT: 19 %
Lymphs Abs: 2.2 10*3/uL (ref 0.7–4.0)
MCH: 28.7 pg (ref 26.0–34.0)
MCHC: 31.6 g/dL (ref 30.0–36.0)
MCV: 90.6 fL (ref 80.0–100.0)
Monocytes Absolute: 1.2 10*3/uL — ABNORMAL HIGH (ref 0.1–1.0)
Monocytes Relative: 11 %
NEUTROS ABS: 7.3 10*3/uL (ref 1.7–7.7)
Neutrophils Relative %: 61 %
Platelets: 243 10*3/uL (ref 150–400)
RBC: 4.15 MIL/uL (ref 3.87–5.11)
RDW: 19.9 % — ABNORMAL HIGH (ref 11.5–15.5)
WBC: 11.7 10*3/uL — ABNORMAL HIGH (ref 4.0–10.5)
nRBC: 0 % (ref 0.0–0.2)

## 2018-06-13 LAB — BASIC METABOLIC PANEL
Anion gap: 8 (ref 5–15)
BUN: 18 mg/dL (ref 8–23)
CO2: 24 mmol/L (ref 22–32)
Calcium: 8.3 mg/dL — ABNORMAL LOW (ref 8.9–10.3)
Chloride: 109 mmol/L (ref 98–111)
Creatinine, Ser: 0.86 mg/dL (ref 0.44–1.00)
GFR calc Af Amer: >60 mL/min (ref >60)
GFR calc non Af Amer: >60 mL/min (ref >60)
Glucose, Bld: 90 mg/dL (ref 70–99)
Potassium: 4.4 mmol/L (ref 3.5–5.1)
SODIUM: 141 mmol/L (ref 135–145)

## 2018-06-13 LAB — GLUCOSE, CAPILLARY: Glucose-Capillary: 82 mg/dL (ref 70–99)

## 2018-06-13 NOTE — ED Notes (Signed)
Report given to [facility] per [employer]. Caretaker at bedside and verbalizes discharge instructions and has no questions at this time.

## 2018-06-13 NOTE — ED Provider Notes (Signed)
[facility] Emergency Department Provider Note  ____________________________________________  Time seen: Approximately 9:09 AM  I have reviewed the triage vital signs and the nursing notes.   HISTORY  Chief Complaint Fall  Level 5 caveat:  Portions of the history and physical were unable to be obtained due to dementia   HPI RAY GLACKEN is a 84 y.o. female with a history of dementia, hypothyroidism, and frequent falls who presents for evaluation of a fall.  Patient sustained a unwitnessed fall this morning.  Was found to have a large occipital hematoma which prompted visit to the emergency room.  Patient is alert and oriented to self and place.  Does not remember the fall which is baseline for her.  She denies headache or neck pain, back pain, chest pain, extremity pain, abdominal pain.  Patient has several skin tears and bruises for several recent falls.  She is not on blood thinners.  Past Medical History:  Diagnosis Date  . Anxiety   . Cancer ([place])    breast  . Dementia ([place])   . Hypothyroidism   . Shingles     Patient Active Problem List   Diagnosis Date Noted  . Palliative care encounter 06/10/2018  . Memory loss 06/10/2018  . Anorexia 06/10/2018  . Weakness generalized 06/10/2018  . Sepsis ([place]) 06/06/2018  . Hypoxia 05/28/2018  . Pressure injury of skin 05/24/2018  . Calculus of gallbladder without cholecystitis without obstruction   . Acute kidney injury ([place]) 05/23/2018  . Status post kyphoplasty 05/10/2018  . Wrist fracture, bilateral 03/23/2018  . Dementia in Alzheimer's disease ([place]) 05/24/2017  . GI bleed 05/15/2017  . Acute blood loss anemia 05/15/2017  . Acute encephalopathy 05/15/2017  . Tachycardia 05/15/2017  . AMD (age related macular degeneration) 01/03/2015  . Allergic rhinitis 12/12/2013  . Depressive disorder, not elsewhere classified 12/12/2013  . Hypothyroidism 12/12/2013  . Postherpetic neuralgia 12/12/2013     Past Surgical History:  Procedure Laterality Date  . APPENDECTOMY    . BREAST SURGERY    . EYE SURGERY    . KYPHOPLASTY N/A 06/27/2015   Procedure: KYPHOPLASTY L4;  Surgeon: Hessie Knows, MD;  Location: [facility] ORS;  Service: Orthopedics;  Laterality: N/A;  . KYPHOPLASTY N/A 05/10/2018   Procedure: Manfred Arch;  Surgeon: Hessie Knows, MD;  Location: [facility] ORS;  Service: Orthopedics;  Laterality: N/A;  . ORIF WRIST FRACTURE Bilateral 03/24/2018   Procedure: OPEN REDUCTION INTERNAL FIXATION (ORIF) WRIST FRACTURE;  Surgeon: Hessie Knows, MD;  Location: [facility] ORS;  Service: Orthopedics;  Laterality: Bilateral;    Prior to Admission medications   Medication Sig Start Date End Date Taking? Authorizing Provider  acetaminophen (TYLENOL) 325 MG tablet Take 2 tablets (650 mg total) by mouth every 6 (six) hours as needed for mild pain (or Fever >/= 101). 05/18/17   Nicholes Mango, MD  acetaminophen (TYLENOL) 325 MG tablet Take 650 mg by mouth 4 (four) times daily.    [provider]  albuterol (ACCUNEB) 0.63 MG/3ML nebulizer solution Take 3 mLs by nebulization every 4 (four) hours as needed for wheezing or shortness of breath.    [provider]  alendronate (FOSAMAX) 70 MG tablet Take 70 mg by mouth every Sunday.  02/12/17   [provider]  aspirin EC 81 MG tablet Take 81 mg by mouth daily.    [provider]  Calcium-Phosphorus-Vitamin D (CALCIUM/D3 ADULT GUMMIES PO) Take 1 tablet by mouth 2 (two) times daily. With lunch & supper  [provider]  citalopram (CELEXA) 10 MG tablet Take 10 mg by mouth daily.    [provider]  dicyclomine (BENTYL) 20 MG tablet Take 20 mg by mouth 4 (four) times daily.  03/09/18   [provider]  donepezil (ARICEPT) 10 MG tablet Take 10 mg by mouth at bedtime.     [provider]  feeding supplement, ENSURE ENLIVE, (ENSURE ENLIVE) LIQD Take 237 mLs by mouth 2 (two) times daily between meals.  05/27/18   Saundra Shelling, MD  fluticasone (FLONASE) 50 MCG/ACT nasal spray Place 2 sprays into both nostrils at bedtime.     [provider]  furosemide (LASIX) 20 MG tablet Take 10 mg by mouth daily.    [provider]  gabapentin (NEURONTIN) 600 MG tablet Take 600 mg by mouth 3 (three) times daily.     [provider]  levothyroxine (SYNTHROID, LEVOTHROID) 112 MCG tablet Take 112 mcg by mouth daily before breakfast.     [provider]  lidocaine (LIDODERM) 5 % Place 1 patch onto the skin daily. Remove & Discard patch within 12 hours or as directed by MD 06/08/18   Max Sane, MD  Multiple Vitamin (MULTIVITAMIN WITH MINERALS) TABS tablet Take 1 tablet by mouth daily with lunch. Alive or Museum/gallery curator, Historical, MD  pantoprazole (PROTONIX) 40 MG tablet Take 1 tablet (40 mg total) by mouth 2 (two) times daily. 06/23/17   Virgel Manifold, MD  potassium chloride SA (K-DUR,KLOR-CON) 20 MEQ tablet Take 20 mEq by mouth daily.    [provider]  Saccharomyces boulardii (PROBIOTIC) 250 MG CAPS Take 250 mg by mouth 2 (two) times daily.    [provider]  traMADol (ULTRAM) 50 MG tablet Take 1 tablet (50 mg total) by mouth every 8 (eight) hours as needed for moderate pain or severe pain. 05/30/18   Hillary Bow, MD  vitamin C (ASCORBIC ACID) 250 MG tablet Take 500 mg by mouth 2 (two) times daily.    [provider]    Allergies Nsaids; Lactose intolerance (gi); and Meloxicam  Family History  Problem Relation Age of Onset  . CAD Mother   . CAD Father     Social History Social History   Tobacco Use  . Smoking status: Current Every Day Smoker    Packs/day: 0.50  . Smokeless tobacco: Never Used  Substance Use Topics  . Alcohol use: No  . Drug use: No    Review of Systems Constitutional: Negative for fever. Eyes: Negative for visual changes. ENT: Negative for facial injury or neck injury Cardiovascular:  Negative for chest injury. Respiratory: Negative for shortness of breath. Negative for chest wall injury. Gastrointestinal: Negative for abdominal pain or injury. Genitourinary: Negative for dysuria. Musculoskeletal: Negative for back injury, negative for arm or leg pain. Skin: Negative for laceration/abrasions. Neurological: + head injury.   ____________________________________________   PHYSICAL EXAM:  VITAL SIGNS: ED Triage Vitals  Enc Vitals Group     BP --      Pulse Rate 06/13/18 0905 91     Resp 06/13/18 0905 (!) 21     Temp 06/13/18 0905 97.7 F (36.5 C)     Temp Source 06/13/18 0905 Oral     SpO2 06/13/18 0905 95 %     Weight 06/13/18 0906 167 lb 4.8 oz (75.9 kg)     Height 06/13/18 0906 5\' 4"  (1.626 m)     Head Circumference --  [facility] --      Pain Score 06/13/18 0906 0     Pain Loc --      Pain Edu? --      Excl. in [place]? --    Full spinal precautions maintained throughout the trauma exam. Constitutional: Alert and oriented x 2. No acute distress. Does not appear intoxicated. HEENT Head: Normocephalic large occipital hematoma with no laceration or bleeding. Face: No facial bony tenderness. Stable midface Ears: No hemotympanum bilaterally. No Battle sign Eyes: No eye injury. PERRL. No raccoon eyes Nose: Nontender. No epistaxis. No rhinorrhea Mouth/Throat: Mucous membranes are moist. No oropharyngeal blood. No dental injury. Airway patent without stridor. Normal voice. Neck: no C-collar in place. No midline c-spine tenderness.  Cardiovascular: Normal rate, regular rhythm. Normal and symmetric distal pulses are present in all extremities. Pulmonary/Chest: Chest wall is stable and nontender to palpation/compression. Normal respiratory effort. Breath sounds are normal. No crepitus.  Abdominal: Soft, nontender, non distended. Musculoskeletal: Several bruises in different healing stages throughout her back.  Nontender with normal full range of motion in all  extremities. No deformities. No thoracic or lumbar midline spinal tenderness. Pelvis is stable. Skin: Skin is warm, dry and intact.  Several old skin tears in the right and left upper extremities Psychiatric: Speech and behavior are appropriate. Neurological: Normal speech and language. Moves all extremities to command. No gross focal neurologic deficits are appreciated.  Glascow Coma Score: 4 - Opens eyes on own 6 - Follows simple motor commands 4 - Seems confused, disoriented GCS: 14   ____________________________________________   LABS (all labs ordered are listed, but only abnormal results are displayed)  Labs Reviewed  CBC WITH DIFFERENTIAL/PLATELET - Abnormal; Notable for the following components:      Result Value   WBC 11.7 (*)    Hemoglobin 11.9 (*)    RDW 19.9 (*)    Monocytes Absolute 1.2 (*)    Eosinophils Absolute 0.8 (*)    All other components within normal limits  BASIC METABOLIC PANEL - Abnormal; Notable for the following components:   Calcium 8.3 (*)    All other components within normal limits  URINALYSIS, COMPLETE (UACMP) WITH MICROSCOPIC - Abnormal; Notable for the following components:   Color, Urine YELLOW (*)    APPearance CLEAR (*)    All other components within normal limits  GLUCOSE, CAPILLARY   ____________________________________________  EKG  ED ECG REPORT I, Rudene Re, the attending physician, personally viewed and interpreted this ECG.  Normal sinus rhythm, rate of 93, normal intervals, normal axis, no ST elevations or depressions, diffuse T wave flattening.  Unchanged from prior. ____________________________________________  RADIOLOGY  I have personally reviewed the images performed during this visit and I agree with the Radiologist's read.   Interpretation by Radiologist:  Dg Chest 2 View  Result Date: 06/13/2018 CLINICAL DATA:  Unwitnessed fall.  Back pain. EXAM: CHEST - 2 VIEW COMPARISON:  June 06, 2018 FINDINGS:  Mild atelectasis in the left base. The heart, hila, mediastinum, lungs, and pleura are otherwise unremarkable. Previous vertebroplasty. IMPRESSION: No acute abnormalities.  Mild atelectasis in the left base. Electronically Signed   By: Dorise Bullion III M.D   On: 06/13/2018 10:10   Dg Thoracic Spine 2 View  Result Date: 06/13/2018 CLINICAL DATA:  Fall.  Thoracic back pain. EXAM: THORACIC SPINE 2 VIEWS COMPARISON:  06/06/2018.  CT 05/23/2018. FINDINGS: Old augmented fracture at T8. Old compression fracture at T12 with vertebra plana. No evidence of acute thoracic region fracture.  Posteromedial ribs are negative. IMPRESSION: No acute finding. Old augmented fracture at T8. Old vertebra plana at T12. Electronically Signed   By: Nelson Chimes M.D.   On: 06/13/2018 10:05   Ct Head Wo Contrast  Result Date: 06/13/2018 CLINICAL DATA:  Unwitnessed fall.  Found on the floor.  Dementia. EXAM: CT HEAD WITHOUT CONTRAST CT CERVICAL SPINE WITHOUT CONTRAST TECHNIQUE: Multidetector CT imaging of the head and cervical spine was performed following the standard protocol without intravenous contrast. Multiplanar CT image reconstructions of the cervical spine were also generated. COMPARISON:  06/06/2018 FINDINGS: CT HEAD FINDINGS Brain: Generalized atrophy. Chronic small-vessel ischemic changes of the hemispheric white matter. Chronic low-density subdural hygromas. No sign of acute infarction, mass lesion, hemorrhage, hydrocephalus or subdural hematoma. Vascular: There is atherosclerotic calcification of the major vessels at the base of the brain. Skull: Negative Sinuses/Orbits: Clear/normal Other: Posterior scalp hematoma. CT CERVICAL SPINE FINDINGS Alignment: No traumatic malalignment. 2 mm degenerative anterolisthesis C7-T1. Skull base and vertebrae: Normal Soft tissues and spinal canal: Normal Disc levels: Mild spondylosis at C5-6 with small osteophytes and mild foraminal narrowing on the right. Facet osteoarthritis at  C7-T1 with 2 mm of anterolisthesis but no stenosis. Upper chest: Negative Other: None IMPRESSION: Head CT: No acute intracranial finding. Atrophy, chronic small vessel ischemic changes and subdural hygromas. Posterior scalp hematoma. Cervical spine CT: No acute or traumatic finding. Mild degenerative changes. Electronically Signed   By: Nelson Chimes M.D.   On: 06/13/2018 10:09   Ct Cervical Spine Wo Contrast  Result Date: 06/13/2018 CLINICAL DATA:  Unwitnessed fall.  Found on the floor.  Dementia. EXAM: CT HEAD WITHOUT CONTRAST CT CERVICAL SPINE WITHOUT CONTRAST TECHNIQUE: Multidetector CT imaging of the head and cervical spine was performed following the standard protocol without intravenous contrast. Multiplanar CT image reconstructions of the cervical spine were also generated. COMPARISON:  06/06/2018 FINDINGS: CT HEAD FINDINGS Brain: Generalized atrophy. Chronic small-vessel ischemic changes of the hemispheric white matter. Chronic low-density subdural hygromas. No sign of acute infarction, mass lesion, hemorrhage, hydrocephalus or subdural hematoma. Vascular: There is atherosclerotic calcification of the major vessels at the base of the brain. Skull: Negative Sinuses/Orbits: Clear/normal Other: Posterior scalp hematoma. CT CERVICAL SPINE FINDINGS Alignment: No traumatic malalignment. 2 mm degenerative anterolisthesis C7-T1. Skull base and vertebrae: Normal Soft tissues and spinal canal: Normal Disc levels: Mild spondylosis at C5-6 with small osteophytes and mild foraminal narrowing on the right. Facet osteoarthritis at C7-T1 with 2 mm of anterolisthesis but no stenosis. Upper chest: Negative Other: None IMPRESSION: Head CT: No acute intracranial finding. Atrophy, chronic small vessel ischemic changes and subdural hygromas. Posterior scalp hematoma. Cervical spine CT: No acute or traumatic finding. Mild degenerative changes. Electronically Signed   By: Nelson Chimes M.D.   On: 06/13/2018 10:09        ____________________________________________   PROCEDURES  Procedure(s) performed: None Procedures Critical Care performed:  None ____________________________________________   INITIAL IMPRESSION / ASSESSMENT AND PLAN / ED COURSE   84 y.o. female with a history of dementia, hypothyroidism, and frequent falls who presents for evaluation of a unwitnessed fall.  Patient seems to be at baseline per EMS report, alert and oriented x2, has no pain.  She has a large occipital hematoma but no CT and L-spine tenderness.  Patient has several bruises in different healing stages and skin tears from several recent falls.  CT head and cervical spine has been ordered.  Chest x-ray and thoracic spine has been ordered due to bruising which is  unclear if it is old or new.  Patient has a dressing over several skin tears on the right which looks old and dirty.  That was unwrapped, washed and redressed.  She has full painless range of motion of her hips and no deformities elsewhere.  Since the fall was unwitnessed will check basic labs, EKG, urinalysis to rule out other possible causes of patient's fall such as dehydration, AKI, electrolyte abnormalities or UTI.    _________________________ 10:23 AM on 06/13/2018 -----------------------------------------  Labs, urine, and imaging with no acute findings.  Patient remains at baseline.  Will discharge back to the skilled nursing facility.   As part of my medical decision making, I reviewed the following data within the [place] notes reviewed and incorporated, Labs reviewed , EKG interpreted , Old EKG reviewed, Old chart reviewed, Radiograph reviewed  Notes from prior ED visits and [place] Controlled Substance Database    Pertinent labs & imaging results that were available during my care of the patient were reviewed by me and considered in my medical decision making (see chart for  details).    ____________________________________________   FINAL CLINICAL IMPRESSION(S) / ED DIAGNOSES  Final diagnoses:  Fall, initial encounter  Hematoma of occipital surface of head, initial encounter      NEW MEDICATIONS STARTED DURING THIS VISIT:  ED Discharge Orders    None       Note:  This document was prepared using Dragon voice recognition software and may include unintentional dictation errors.    Rudene Re, MD 06/13/18 [phone]

## 2018-06-13 NOTE — ED Triage Notes (Signed)
Patient presents to the ED via EMS from [facility] for unwitnessed fall.  Facility staff found patient in the floor.  Patient does not remember how she fell.  Patient has dementia.  Patient denies pain at this time.  Patient has a hematoma to the back of her head.  Per EMS staff saw patient at breakfast this morning.

## 2018-06-13 NOTE — Discharge Instructions (Addendum)
You were seen in the emergency department after a fall. Luckily all of your imaging studies did not show any evidence of injuries. Follow-up with you doctor within the next 2-3 days for further evaluation. Sometimes injuries can present at a later time and therefore it is imperative that you return to the emergency room if you have a severe headache, facial droop, neck pain, numbness or weakness of your extremities, slurred speech, difficulty finding words, chest pain, back pain, abdominal pain, or any other new symptoms that were not present during this visit. You may take Tylenol at home for your pain.  Change dressing of the skin tears on the right upper extremity every other day.

## 2018-06-13 NOTE — ED Notes (Signed)
Called [facility] and spoke to [place], [place].  She stated that patient is listed as a Full code and does not have a DNR or MOST form on her chart.

## 2018-06-16 ENCOUNTER — Encounter: Payer: Self-pay | Admitting: Nurse Practitioner

## 2018-06-16 ENCOUNTER — Non-Acute Institutional Stay: Payer: Medicare Other | Admitting: Nurse Practitioner

## 2018-06-16 VITALS — HR 82 | Resp 18

## 2018-06-16 NOTE — Progress Notes (Signed)
[facility] Telephone: [phone] Fax: [phone]  PATIENT NAME: Tina [name] DOB: May 06, 1928 MRN: [id number]  PRIMARY CARE PROVIDER:   Derinda Late, MD  REFERRING PROVIDER:  Dr Lovie Macadamia; [facility] RESPONSIBLE PARTY: [name] at [phone]     ASSESSMENT:     I visited and observed Tina [name]. We talked about purpose for palliative medicine visit. Asked if she was having symptoms of pain and she replied no. She did make eye contact with verbal cues. It was difficult to get her to answer questions. Her voice was very soft and weak. She was cooperative with assessment. Emotional support provided.  I called Tina [name] son. Talked about purpose for palliative medicine visit. We talked about visit with Tina Lamica. We talked about past medical history in the setting of chronic disease, natural aging and recent hospitalizations. We talked about symptoms, appetite. We talked about her functional level walking with therapy. We talked about her cognition. We talked about medical goals of care including aggressive versus conservative versus comfort care. We talked about code status says she currently remains a full code. [name] endorses that the reason why he wants her to be a full code while she's at a facility is to ensure that care is being provided. [name] endorses he wants treated what is treatable. Tina verbalizes he does understand full code vs. DNR. We talked about role of palliative medicine and plan of care. Wishes are to have her returned home with 24 hour caregivers. We talked about with her slowly improving May benefit from home health PT/OT and in-home palliative medicine following. We did talk about Hospice Services and scenarios. Discuss with her Improvement that she may continue to improve although she could be rallying. Discuss taking one day at a time. [name] did ask if she could be a full code under Hospice Services and discussed at length that it is an  option but hospice philosophy is comfort care. [name] endorses that he may change his mind if hospice is involved about code status. Therapeutic listening and emotional support provided. [name] endorses he was very thankful for palliative medicine visit and discussion. Discussed will follow up in two weeks if needed or sooner should she declined. Questions answered to satisfaction. Contact information provided.   RECOMMENDATIONS and PLAN:  1.Palliative care encounter Z51.5; Palliative medicine team will continue to support patient, patient's family, and medical team. Visit consisted of counseling and education dealing with the complex and emotionally intense issues of symptom management and palliative care in the setting of serious and potentially life-threatening illness  2. Memory loss R41.3 secondary to dementia appears progressive. Medical goals to continue to focus on Comfort, redirecting with supportive measures.  3. Generalized weaknessR53.1 secondary to Dementia disease continue with therapy as able. Encourage energy conservation and rest times.  4. Anorexia R63.0 secondary to dementia appetite poor Continue to encourage supplements and comfort feedings.   I spent 75 minutes providing this consultation,  from 8:00am to 9:00am. More than 50% of the time in this consultation was spent coordinating communication.   HISTORY OF PRESENT ILLNESS:  Tina [name] is a 84 y.o. year old female with multiple medical problems including dementia, breast cancer s/p sgy, hypothyroidism, history of shingles, anxiety, bilateral wrist fractures s/p ORIF, kyphoplasty, appendectomy. Hospitalize 9 / 25/ 2019 to 9 / 27 / 2019 after a fall with bilateral wrist fractures s/p ORIF. Hospitalized 11 / 12 / 2019 to 65 / 13 / 2019 for T8 compression  fracture requiring kyphoplasty. Hospitalized 11 / 25 / 2018 to 55 / 29 / 2019 fora fall with increased confusion, 20 pound weight loss, diarrhea, difficulty urinating with  episodes of vomiting. Workup significant for 3 rib fractures, acute kidney injury evaluated by surgery for cholelithiasis so no evidence of cholecystitis. Her documentationpoor surgical candidate due to multiple comorbidities. She was discharged home. Palliative medicine did fall during hospitalization. Poor documentationdiscussion family was hopeful and would like her to return home with 24 hour caregivers. Discussed code status and patients wishes are for DNR/DNI and does not want any form of artificial feeding such as a peg tube placement. Most form was not completed at that time as daughter wished for her brother to review it as well. 11 /30 / 2019 was hospitalized after being visited by [name] and found her where she was screaming in pain . She was found to be hypoxic with OT saturation 88% with mild pulmonary vascular congestion requiring Lasix. She was discharged to short-term rehab. 12 / 9 / 2019 to 12 / 11 / 2019 with sepsis secondary to urinary tract infection, injury to head with scalp laceration. She did receive antibiotic therapy. Acute encephalopathy likely metabolic secondary to sepsis, UTI in the city of chronic dementia. Congestive heart failure compensated. Hypothyroidism continued on Synthroid. She was discharged back to short-term rehab.Tina [name] continues to reside in short-term rehab. She has been walking a few steps with therapy. She does continue to require assistance for adl's. She does require assistance for feeding and appetite continues to be poor but slowly improving. At present she is lying in bed, appears comfortable, thin, elderly. Caregiver at bedside. Palliative Care was asked to help address goals of care.   CODE STATUS: Full  PPS: 30% HOSPICE ELIGIBILITY/DIAGNOSIS: possible <6 months with clinical presentation  PAST MEDICAL HISTORY:  Past Medical History:  Diagnosis Date  . Anxiety   . Cancer ([place])    breast  . Dementia ([place])   . Hypothyroidism   .  Shingles     SOCIAL HX:  Social History   Tobacco Use  . Smoking status: Current Every Day Smoker    Packs/day: 0.50  . Smokeless tobacco: Never Used  Substance Use Topics  . Alcohol use: No    ALLERGIES:  Allergies  Allergen Reactions  . Nsaids Other (See Comments)    H/O GI BLEED  . Lactose Intolerance (Gi)   . Meloxicam     Other reaction(s): Other (See Comments) Upper GI Bleeding     PERTINENT MEDICATIONS:  Outpatient Encounter Medications as of 06/16/2018  Medication Sig  . acetaminophen (TYLENOL) 325 MG tablet Take 2 tablets (650 mg total) by mouth every 6 (six) hours as needed for mild pain (or Fever >/= 101).  Marland Kitchen acetaminophen (TYLENOL) 325 MG tablet Take 650 mg by mouth 4 (four) times daily.  Marland Kitchen albuterol (ACCUNEB) 0.63 MG/3ML nebulizer solution Take 1 ampule by nebulization every 4 (four) hours as needed for wheezing or shortness of breath.  Marland Kitchen alendronate (FOSAMAX) 70 MG tablet Take 70 mg by mouth every Sunday.   Marland Kitchen aspirin EC 81 MG tablet Take 81 mg by mouth daily.  . Calcium Carbonate-Vitamin D3 (CALCIUM 600-D) 600-400 MG-UNIT TABS Take 1 tablet by mouth 2 (two) times daily. With lunch and dinner  . citalopram (CELEXA) 10 MG tablet Take 10 mg by mouth daily.  Marland Kitchen dicyclomine (BENTYL) 20 MG tablet Take 20 mg by mouth 4 (four) times daily.   Marland Kitchen donepezil (ARICEPT) 10  MG tablet Take 10 mg by mouth at bedtime.   . feeding supplement, ENSURE ENLIVE, (ENSURE ENLIVE) LIQD Take 237 mLs by mouth 2 (two) times daily between meals. (Patient not taking: Reported on 06/13/2018)  . fluticasone (FLONASE) 50 MCG/ACT nasal spray Place 2 sprays into both nostrils at bedtime.   . furosemide (LASIX) 20 MG tablet Take 10 mg by mouth daily.  Marland Kitchen gabapentin (NEURONTIN) 600 MG tablet Take 600 mg by mouth 3 (three) times daily.   Marland Kitchen levothyroxine (SYNTHROID, LEVOTHROID) 112 MCG tablet Take 112 mcg by mouth daily before breakfast.   . lidocaine (LIDODERM) 5 % Place 1 patch onto the skin daily.  Remove & Discard patch within 12 hours or as directed by MD  . Melatonin 3 MG TABS Take 6 mg by mouth at bedtime.  . Multiple Vitamin (MULTIVITAMIN WITH MINERALS) TABS tablet Take 1 tablet by mouth daily.   . pantoprazole (PROTONIX) 40 MG tablet Take 1 tablet (40 mg total) by mouth 2 (two) times daily.  . potassium chloride SA (K-DUR,KLOR-CON) 20 MEQ tablet Take 20 mEq by mouth every evening.   . Saccharomyces boulardii (PROBIOTIC) 250 MG CAPS Take 250 mg by mouth 2 (two) times daily.  . traMADol (ULTRAM) 50 MG tablet Take 1 tablet (50 mg total) by mouth every 8 (eight) hours as needed for moderate pain or severe pain.  . vitamin C (ASCORBIC ACID) 250 MG tablet Take 500 mg by mouth 2 (two) times daily.   No facility-administered encounter medications on file as of 06/16/2018.     PHYSICAL EXAM:   General: NAD, frail appearing, thin Cardiovascular: regular rate and rhythm Pulmonary: clear ant fields Abdomen: soft, nontender, + bowel sounds GU: no suprapubic tenderness Extremities: muscle wasting; no joint deformities Skin: no rashes Neurological: Weakness but otherwise nonfocal  Christin Ihor Gully, NP

## 2018-07-01 ENCOUNTER — Telehealth: Payer: Self-pay

## 2018-07-01 NOTE — Telephone Encounter (Signed)
Received message from hospice director that son, Tina [name] had called and requesting callback from palliative care. RN contacted son, Tina [name] who reports they have stopped PT for his mother at [facility] because she is declining and that she is supposed to be discharged home on Tuesday due to not being able to participate in therapy.  Son would like guidance and possible hospice services.  Spoke with son and scheduled follow up visit for latoya to see patient at facility for palliative care on Monday 07/04/2018 at 85 am, son aware and appreciative.

## 2018-07-04 ENCOUNTER — Non-Acute Institutional Stay: Payer: Medicare Other | Admitting: Student

## 2018-07-04 VITALS — BP 100/60 | HR 96 | Resp 20 | Wt 158.0 lb

## 2018-07-04 DIAGNOSIS — Z515 Encounter for palliative care: Secondary | ICD-10-CM

## 2018-07-04 NOTE — Progress Notes (Addendum)
[facility] Telephone: [phone] Fax: [phone]  PATIENT NAME: Tina [name] DOB: Sep 21, 1927 MRN: [id number]  PRIMARY CARE PROVIDER:   DR. Orbie Pyo PROVIDER:  Derinda Late, MD [phone] [address] [facility] [place], [place] [address]  RESPONSIBLE PARTY:  Son, Marrion Finan   ASSESSMENT: Tina [name] is an  85 year old patient with multiple medical problems including dementia, ASCVD, CHF, hypothyroidism, depression, anxiety, post herpetic neuralgia, macular degeneration, allergic rhinitis, GERD, history of breast cancer. Role of Palliative Medicine explained to patient, son [name] and staff. Tina [name] is resting in bed upon arrival; private caregiver Tina [name] is present. Patient has sunken jaws and neckline. Her abdomen is rotund, taught. She has 3+ pitting edema to bilateral feet extending up her legs. Weeping noted to left lower extremity. She does appear to be short of breath when speaking and has to pause in between words. She has crackles to right base. She is able to answer direct questions and state where she is at currently and spoke of growing up in [place]. Discussed recent declines with staff, private caregiver Tina [name] and son [name] via phone. We discussed eligibility for Hospice; son agrees to Hospice assessment in the home as patient is planning to discharge home tomorrow. Dr. Lovie Macadamia and PA, Mortimer Fries were notified by staff nurse Dorris Singh during visit to update on worsening edema, dyspnea. Tina [name] received new orders to increase furosemide to 20mg  BID x 3 days.      RECOMMENDATIONS and PLAN:  1. Medical goals of therapy: At this, time goals of therapy focus on comfort and symptom management. Patient is being referred for Hospice assessment in the home related to her ASCVD/CHF.   2. Symptom management: dyspnea-continue albuterol 0.63mg /97ml every 4 hours prn, take furosemide 20mg  BID x 3 days then resume furosemide 10mg   daily as ordered. Oxygen prn is recommended.   3. Discharge Planning: Patient to discharge home on 07/05/2018 with assistance of family and private caregivers. 4. Emotional/spiritual support: Discussed with son [name], staff nurse Dorris Singh, facility SW [place] and private caregiver Tina [name]. They are encouraged to call with questions.   Palliative Care to continue to follow until admitted to Hospice services for emotional/spiritual support, ongoing discussions trajectory of chronic disease progression, medical goals of therapy, monitor for symptoms with management, and reduce ED and hospitalizations with recommendations.  I spent 35 minutes providing this consultation,  from 11:30am to 12:05pm. More than 50% of the time in this consultation was spent coordinating communication.   HISTORY OF PRESENT ILLNESS:  Tina [name] is a 85 y.o. year old female with multiple medical problems including dementia, ASCVD, CHF, hypothyroidism, depression, anxiety, post herpetic neuralgia, macular degeneration, allergic rhinitis, GERD, history of breast cancer. Palliative Care was asked to help address ongoing goals of care and to evaluate for Hospice appropriateness per son [name]. Tina [name] was hospitalized 12/9-12/11/19 status post fall with injury of head and laceration of scalp. During hospitalization she was treated for sepsis, likely secondary to UTI, acute metabolic encephalopathy. ER visit on 05/28/18 due to chest wall pain, dyspnea, hypoxia. Previous hospitalization on 05/23/18 due to fall; she had been having increased confusion, dizziness, low blood pressures and a loss of 20 pounds since discharging home from rehab. During this hospitalization, she was found to have three rib fractures and acute kidney injury. Chest CT on 05/23/18 notes multiple  pulmonary nodules in left lower lobe, some favored to be scarring or atelectasis, cirrhotic appearing liver,  moderate amount of ascites noted and aortic atherosclerosis. She  had a kyphoplasty-T8 on 05/10/18 and bilateral wrist fractures with repair 03/23/18 status post fall. Tina [name] has been receiving therapy at [facility] and therapy was discontinued the end of last week due to not progressing. Son [name] states that patient has declined since her most recent fall about two weeks ago. He reports swelling to her lower extremities has worsened and staff has been unable to put shoes on resident. He reports patient not ambulating in the past two weeks. Poor appetite reported by facility nurse [facility] and private caregiver Tina [name]. Tina [name] reports feeding patient; she is eating bites to less than 25% of meals at best. She is also receiving nutritional supplements. Tina [name] reports "feeling full" all of the time but has no appetite. Son reports confusion and agitation that is worse in the evenings. Tina [name] reports patient sleeping throughout the day and is awake only when aroused. Tina [name] states she is "worn out." She denies pain at present; she does report chest pain at times; staff nure Tina [name] reports chest pain, but states it appears to be anxiety related. She does report feeling short of breath at rest and with minimal exertion; orthopnea reported. Tina [name] states that she is now using wheel chair to transport patient to and from bathroom and patient is a stand/pivot with assist x 1. She states it has been taking patient 15 + minutes for her breathing to return to baseline and she has also been receiving prn nebulizers treatment for dyspnea. Non-productive cough reported. Tina [name] weight was 145 pounds 05/23/18, 06/03/18 her weight was 150.8 pounds and her weight was 158 pounds on 07/03/2018 per facility staff. Son [name] states that he was told patient's prognosis was 4-9 months by a physician in one of her recent hospitalizations. Patient is to discharge home tomorrow with assistance of private caregivers.     CODE STATUS: DNR PPS: 30% HOSPICE ELIGIBILITY/DIAGNOSIS: ASCVD, Heart  Failure  PAST MEDICAL HISTORY:  Past Medical History:  Diagnosis Date  . Anxiety   . Cancer ([place])    breast  . Dementia ([place])   . Hypothyroidism   . Shingles     SOCIAL HX:  Social History   Tobacco Use  . Smoking status: Current Every Day Smoker    Packs/day: 0.50  . Smokeless tobacco: Never Used  Substance Use Topics  . Alcohol use: No    ALLERGIES:  Allergies  Allergen Reactions  . Nsaids Other (See Comments)    H/O GI BLEED  . Lactose Intolerance (Gi)   . Meloxicam     Other reaction(s): Other (See Comments) Upper GI Bleeding     PERTINENT MEDICATIONS:  Outpatient Encounter Medications as of 07/04/2018  Medication Sig  . acetaminophen (TYLENOL) 325 MG tablet Take 2 tablets (650 mg total) by mouth every 6 (six) hours as needed for mild pain (or Fever >/= 101).  Marland Kitchen acetaminophen (TYLENOL) 325 MG tablet Take 650 mg by mouth 4 (four) times daily.  Marland Kitchen albuterol (ACCUNEB) 0.63 MG/3ML nebulizer solution Take 1 ampule by nebulization every 4 (four) hours as needed for wheezing or shortness of breath.  Marland Kitchen alendronate (FOSAMAX) 70 MG tablet Take 70 mg by mouth every Sunday.   Marland Kitchen aspirin EC 81 MG tablet Take 81 mg by mouth daily.  . Calcium Carbonate-Vitamin D3 (CALCIUM 600-D) 600-400 MG-UNIT TABS Take 1 tablet by mouth 2 (two) times daily. With lunch and dinner  . citalopram (CELEXA) 10 MG tablet Take 10  mg by mouth daily.  Marland Kitchen dicyclomine (BENTYL) 20 MG tablet Take 20 mg by mouth 4 (four) times daily.   Marland Kitchen donepezil (ARICEPT) 10 MG tablet Take 10 mg by mouth at bedtime.   . feeding supplement, ENSURE ENLIVE, (ENSURE ENLIVE) LIQD Take 237 mLs by mouth 2 (two) times daily between meals. (Patient not taking: Reported on 06/13/2018)  . fluticasone (FLONASE) 50 MCG/ACT nasal spray Place 2 sprays into both nostrils at bedtime.   . furosemide (LASIX) 20 MG tablet Take 10 mg by mouth daily.  Marland Kitchen gabapentin (NEURONTIN) 600 MG tablet Take 600 mg by mouth 3 (three) times daily.   Marland Kitchen  levothyroxine (SYNTHROID, LEVOTHROID) 112 MCG tablet Take 112 mcg by mouth daily before breakfast.   . lidocaine (LIDODERM) 5 % Place 1 patch onto the skin daily. Remove & Discard patch within 12 hours or as directed by MD  . Melatonin 3 MG TABS Take 6 mg by mouth at bedtime.  . Multiple Vitamin (MULTIVITAMIN WITH MINERALS) TABS tablet Take 1 tablet by mouth daily.   . pantoprazole (PROTONIX) 40 MG tablet Take 1 tablet (40 mg total) by mouth 2 (two) times daily.  . potassium chloride SA (K-DUR,KLOR-CON) 20 MEQ tablet Take 20 mEq by mouth every evening.   . Saccharomyces boulardii (PROBIOTIC) 250 MG CAPS Take 250 mg by mouth 2 (two) times daily.  . traMADol (ULTRAM) 50 MG tablet Take 1 tablet (50 mg total) by mouth every 8 (eight) hours as needed for moderate pain or severe pain.  . vitamin C (ASCORBIC ACID) 250 MG tablet Take 500 mg by mouth 2 (two) times daily.   No facility-administered encounter medications on file as of 07/04/2018.     PHYSICAL EXAM:   General: NAD, critically ill appearing Cardiovascular: regular rate and rhythm Pulmonary: upper lobes clear, crackles to right lower lobe Abdomen: firm, nontender, + bowel sounds GU: no suprapubic tenderness Extremities: 3+ pitting edema, no joint deformities Skin: no rashes Neurological: Weakness but otherwise nonfocal  Ezekiel Slocumb, NP

## 2018-07-05 ENCOUNTER — Inpatient Hospital Stay
Admission: EM | Admit: 2018-07-05 | Discharge: 2018-07-06 | DRG: 640 | Disposition: A | Discharge status: Hospice | Payer: Medicare Other | Attending: Internal Medicine | Admitting: Internal Medicine

## 2018-07-05 ENCOUNTER — Other Ambulatory Visit: Payer: Self-pay

## 2018-07-05 ENCOUNTER — Encounter: Payer: Self-pay | Admitting: *Deleted

## 2018-07-05 ENCOUNTER — Emergency Department: Payer: Medicare Other

## 2018-07-05 DIAGNOSIS — I509 Heart failure, unspecified: Secondary | ICD-10-CM | POA: Diagnosis present

## 2018-07-05 DIAGNOSIS — F17210 Nicotine dependence, cigarettes, uncomplicated: Secondary | ICD-10-CM | POA: Diagnosis present

## 2018-07-05 DIAGNOSIS — Z7982 Long term (current) use of aspirin: Secondary | ICD-10-CM | POA: Diagnosis not present

## 2018-07-05 DIAGNOSIS — R609 Edema, unspecified: Secondary | ICD-10-CM

## 2018-07-05 DIAGNOSIS — G309 Alzheimer's disease, unspecified: Secondary | ICD-10-CM | POA: Diagnosis present

## 2018-07-05 DIAGNOSIS — E860 Dehydration: Secondary | ICD-10-CM | POA: Diagnosis present

## 2018-07-05 DIAGNOSIS — Z853 Personal history of malignant neoplasm of breast: Secondary | ICD-10-CM | POA: Diagnosis not present

## 2018-07-05 DIAGNOSIS — R627 Adult failure to thrive: Secondary | ICD-10-CM

## 2018-07-05 DIAGNOSIS — Z515 Encounter for palliative care: Secondary | ICD-10-CM | POA: Diagnosis present

## 2018-07-05 DIAGNOSIS — Z7189 Other specified counseling: Secondary | ICD-10-CM | POA: Diagnosis not present

## 2018-07-05 DIAGNOSIS — Z79899 Other long term (current) drug therapy: Secondary | ICD-10-CM

## 2018-07-05 DIAGNOSIS — G9341 Metabolic encephalopathy: Secondary | ICD-10-CM | POA: Diagnosis present

## 2018-07-05 DIAGNOSIS — I959 Hypotension, unspecified: Secondary | ICD-10-CM | POA: Diagnosis present

## 2018-07-05 DIAGNOSIS — Z66 Do not resuscitate: Secondary | ICD-10-CM | POA: Diagnosis present

## 2018-07-05 DIAGNOSIS — Z888 Allergy status to other drugs, medicaments and biological substances status: Secondary | ICD-10-CM | POA: Diagnosis not present

## 2018-07-05 DIAGNOSIS — E039 Hypothyroidism, unspecified: Secondary | ICD-10-CM | POA: Diagnosis present

## 2018-07-05 DIAGNOSIS — F0280 Dementia in other diseases classified elsewhere without behavioral disturbance: Secondary | ICD-10-CM | POA: Diagnosis present

## 2018-07-05 DIAGNOSIS — Z79890 Hormone replacement therapy (postmenopausal): Secondary | ICD-10-CM | POA: Diagnosis not present

## 2018-07-05 DIAGNOSIS — F0390 Unspecified dementia without behavioral disturbance: Secondary | ICD-10-CM

## 2018-07-05 DIAGNOSIS — Z7951 Long term (current) use of inhaled steroids: Secondary | ICD-10-CM | POA: Diagnosis not present

## 2018-07-05 DIAGNOSIS — K7460 Unspecified cirrhosis of liver: Secondary | ICD-10-CM | POA: Diagnosis present

## 2018-07-05 DIAGNOSIS — E8809 Other disorders of plasma-protein metabolism, not elsewhere classified: Secondary | ICD-10-CM | POA: Diagnosis present

## 2018-07-05 DIAGNOSIS — E739 Lactose intolerance, unspecified: Secondary | ICD-10-CM | POA: Diagnosis present

## 2018-07-05 LAB — URINALYSIS, COMPLETE (UACMP) WITH MICROSCOPIC
Bilirubin Urine: NEGATIVE
Glucose, UA: NEGATIVE mg/dL
HGB URINE DIPSTICK: NEGATIVE
Ketones, ur: NEGATIVE mg/dL
Leukocytes, UA: NEGATIVE
Nitrite: NEGATIVE
Protein, ur: NEGATIVE mg/dL
Specific Gravity, Urine: 1.019 (ref 1.005–1.030)
pH: 5 (ref 5.0–8.0)

## 2018-07-05 LAB — CBC WITH DIFFERENTIAL/PLATELET
Abs Immature Granulocytes: 0.09 10*3/uL — ABNORMAL HIGH (ref 0.00–0.07)
Basophils Absolute: 0 10*3/uL (ref 0.0–0.1)
Basophils Relative: 0 %
Eosinophils Absolute: 0.2 10*3/uL (ref 0.0–0.5)
Eosinophils Relative: 1 %
HCT: 38.5 % (ref 36.0–46.0)
Hemoglobin: 12.3 g/dL (ref 12.0–15.0)
Immature Granulocytes: 1 %
Lymphocytes Relative: 6 %
Lymphs Abs: 0.8 10*3/uL (ref 0.7–4.0)
MCH: 28.9 pg (ref 26.0–34.0)
MCHC: 31.9 g/dL (ref 30.0–36.0)
MCV: 90.6 fL (ref 80.0–100.0)
Monocytes Absolute: 1.5 10*3/uL — ABNORMAL HIGH (ref 0.1–1.0)
Monocytes Relative: 12 %
Neutro Abs: 10.5 10*3/uL — ABNORMAL HIGH (ref 1.7–7.7)
Neutrophils Relative %: 80 %
Platelets: 198 10*3/uL (ref 150–400)
RBC: 4.25 MIL/uL (ref 3.87–5.11)
RDW: 21.5 % — AB (ref 11.5–15.5)
WBC: 13.1 10*3/uL — ABNORMAL HIGH (ref 4.0–10.5)
nRBC: 0 % (ref 0.0–0.2)

## 2018-07-05 LAB — T4, FREE: Free T4: 1.42 ng/dL (ref 0.82–1.77)

## 2018-07-05 LAB — COMPREHENSIVE METABOLIC PANEL
ALT: 26 U/L (ref 0–44)
ANION GAP: 7 (ref 5–15)
AST: 58 U/L — ABNORMAL HIGH (ref 15–41)
Albumin: 2.1 g/dL — ABNORMAL LOW (ref 3.5–5.0)
Alkaline Phosphatase: 342 U/L — ABNORMAL HIGH (ref 38–126)
BUN: 32 mg/dL — ABNORMAL HIGH (ref 8–23)
CO2: 26 mmol/L (ref 22–32)
Calcium: 8.7 mg/dL — ABNORMAL LOW (ref 8.9–10.3)
Chloride: 108 mmol/L (ref 98–111)
Creatinine, Ser: 1.08 mg/dL — ABNORMAL HIGH (ref 0.44–1.00)
GFR, EST AFRICAN AMERICAN: 53 mL/min — AB (ref >60)
GFR, EST NON AFRICAN AMERICAN: 46 mL/min — AB (ref >60)
Glucose, Bld: 111 mg/dL — ABNORMAL HIGH (ref 70–99)
Potassium: 4.7 mmol/L (ref 3.5–5.1)
Sodium: 141 mmol/L (ref 135–145)
Total Bilirubin: 1.5 mg/dL — ABNORMAL HIGH (ref 0.3–1.2)
Total Protein: 6 g/dL — ABNORMAL LOW (ref 6.5–8.1)

## 2018-07-05 LAB — BRAIN NATRIURETIC PEPTIDE: B Natriuretic Peptide: 279 pg/mL — ABNORMAL HIGH (ref 0.0–100.0)

## 2018-07-05 LAB — TSH: TSH: 3.518 u[IU]/mL (ref 0.350–4.500)

## 2018-07-05 LAB — TROPONIN I: TROPONIN I: 0.04 ng/mL — AB (ref <0.03)

## 2018-07-05 MED ORDER — TRAMADOL HCL 50 MG PO TABS: 50.0000 mg | ORAL_TABLET | Freq: Three times a day (TID) | ORAL | Status: DC | PRN

## 2018-07-05 MED ORDER — CITALOPRAM HYDROBROMIDE 20 MG PO TABS
10.0000 mg | ORAL_TABLET | Freq: Every day | ORAL | Status: DC
  Administered 2018-07-06: 10 mg via ORAL
  Filled 2018-07-05: qty 1

## 2018-07-05 MED ORDER — DONEPEZIL HCL 5 MG PO TABS
10.0000 mg | ORAL_TABLET | Freq: Every day | ORAL | Status: DC
  Filled 2018-07-05 (×2): qty 2

## 2018-07-05 MED ORDER — GABAPENTIN 600 MG PO TABS
600.0000 mg | ORAL_TABLET | Freq: Three times a day (TID) | ORAL | Status: DC
  Administered 2018-07-06 (×2): 600 mg via ORAL
  Filled 2018-07-05 (×2): qty 1

## 2018-07-05 MED ORDER — ONDANSETRON HCL 4 MG/2ML IJ SOLN: 4.0000 mg | Freq: Four times a day (QID) | INTRAMUSCULAR | Status: DC | PRN

## 2018-07-05 MED ORDER — SODIUM CHLORIDE 0.45 % IV SOLN
INTRAVENOUS | Status: DC
  Administered 2018-07-05 (×2): via INTRAVENOUS

## 2018-07-05 MED ORDER — LEVOTHYROXINE SODIUM 50 MCG PO TABS: 125.0000 ug | ORAL_TABLET | Freq: Every day | ORAL | Status: DC

## 2018-07-05 MED ORDER — SODIUM CHLORIDE 0.9 % IV BOLUS
500.0000 mL | Freq: Once | INTRAVENOUS | Status: AC
  Administered 2018-07-05: 500 mL via INTRAVENOUS

## 2018-07-05 MED ORDER — ENOXAPARIN SODIUM 40 MG/0.4ML ~~LOC~~ SOLN
40.0000 mg | SUBCUTANEOUS | Status: DC
  Administered 2018-07-06: 40 mg via SUBCUTANEOUS
  Filled 2018-07-05: qty 0.4

## 2018-07-05 MED ORDER — ACETAMINOPHEN 650 MG RE SUPP: 650.0000 mg | Freq: Four times a day (QID) | RECTAL | Status: DC | PRN

## 2018-07-05 MED ORDER — ALBUTEROL SULFATE (2.5 MG/3ML) 0.083% IN NEBU: 2.5000 mg | INHALATION_SOLUTION | RESPIRATORY_TRACT | Status: DC | PRN

## 2018-07-05 MED ORDER — ACETAMINOPHEN 325 MG PO TABS: 650.0000 mg | ORAL_TABLET | Freq: Four times a day (QID) | ORAL | Status: DC | PRN

## 2018-07-05 MED ORDER — POLYETHYLENE GLYCOL 3350 17 G PO PACK: 17.0000 g | PACK | Freq: Every day | ORAL | Status: DC | PRN

## 2018-07-05 MED ORDER — ONDANSETRON HCL 4 MG PO TABS: 4.0000 mg | ORAL_TABLET | Freq: Four times a day (QID) | ORAL | Status: DC | PRN

## 2018-07-05 MED ORDER — MORPHINE SULFATE (CONCENTRATE) 10 MG/0.5ML PO SOLN: 10.0000 mg | ORAL | Status: DC | PRN

## 2018-07-05 MED ORDER — MELATONIN 5 MG PO TABS
5.0000 mg | ORAL_TABLET | Freq: Every day | ORAL | Status: DC
  Filled 2018-07-05 (×2): qty 1

## 2018-07-05 MED ORDER — MELATONIN 3 MG PO TABS: 6.0000 mg | ORAL_TABLET | Freq: Every day | ORAL | Status: DC

## 2018-07-05 NOTE — ED Triage Notes (Signed)
Pt to ED via EMS from [facility] per sons request after son reports pt has not been acting like herself. Pt has hx of COPD and multiple falls recently leaving her with bilateral broken wrists, a broken spine and broken ribs on the right side( all which appear to be healing appropriately at this time.) No new falls today per EMS.   Pt placed on 2L [place] with EMS due to an oxygen saturation of 88% on RA. Pt has a RA saturation of 91% upon arrival. Pt is also hypotensive upon arrival with a recent change to pts lasix dosing noted in paperwork.

## 2018-07-05 NOTE — ED Notes (Signed)
Pt continues to be weak, son is concerned that she may be dehydrated. He is here to have her evaluated.  He states that she is now on home hospice and he has organized around the clock private duty care at home for his mother in addition to the services available to her through hospice.  He states that he would like to make sure all is ok with her though.  She had significant ble and was placed on lasix, he states that swelling has gone down in legs, some oozing noted

## 2018-07-05 NOTE — H&P (Signed)
[facility] [place] NAME: Tina [name]    MR#:  [id number]  DATE OF BIRTH:  1928/05/07  DATE OF ADMISSION:  07/05/2018  PRIMARY CARE PHYSICIAN: Derinda Late, MD   REQUESTING/REFERRING PHYSICIAN: Dr. Joni Fears  CHIEF COMPLAINT:   Chief Complaint  Patient presents with  . Altered Mental Status    HISTORY OF PRESENT ILLNESS:  Tina [name]  is a 85 y.o. female with a known history of dementia, CHF, cirrhosis, hypothyroidism, depression, anxiety [facility] and [facility]argy.  Patient was discharged from the hospital in December [facility].  She has slowly declined.  Developed significant anasarca and has been started on Lasix recently.  Due to worsening patient has been enrolled in home with hospice services and was supposed to be discharged today home.  But son was concerned that she is not doing well and requested ER to visit.  Here patient was found to be hypotensive with systolic blood pressure in the 80s.  Bolus of 500 normal saline improved blood pressure.  Patient is lethargic and unable to give any history.  Seems to be uncomfortable with pain.  Patient is being admitted for IV fluids.  PAST MEDICAL HISTORY:   Past Medical History:  Diagnosis Date  . Anxiety   . Cancer ([place])    breast  . Dementia ([place])   . Hypothyroidism   . Shingles     PAST SURGICAL HISTORY:   Past Surgical History:  Procedure Laterality Date  . APPENDECTOMY    . BREAST SURGERY    . EYE SURGERY    . KYPHOPLASTY N/A 06/27/2015   Procedure: KYPHOPLASTY L4;  Surgeon: Hessie Knows, MD;  Location: [facility] ORS;  Service: Orthopedics;  Laterality: N/A;  . KYPHOPLASTY N/A 05/10/2018   Procedure: Manfred Arch;  Surgeon: Hessie Knows, MD;  Location: [facility] ORS;  Service: Orthopedics;  Laterality: N/A;  . ORIF WRIST FRACTURE Bilateral 03/24/2018   Procedure: OPEN REDUCTION INTERNAL FIXATION  (ORIF) WRIST FRACTURE;  Surgeon: Hessie Knows, MD;  Location: [facility] ORS;  Service: Orthopedics;  Laterality: Bilateral;    SOCIAL HISTORY:   Social History   Tobacco Use  . Smoking status: Current Every Day Smoker    Packs/day: 0.50  . Smokeless tobacco: Never Used  Substance Use Topics  . Alcohol use: No    FAMILY HISTORY:   Family History  Problem Relation Age of Onset  . CAD Mother   . CAD Father     DRUG ALLERGIES:   Allergies  Allergen Reactions  . Nsaids Other (See Comments)    H/O GI BLEED  . Lactose Intolerance (Gi)   . Meloxicam     Other reaction(s): Other (See Comments) Upper GI Bleeding    REVIEW OF SYSTEMS:   Review of Systems  Unable to perform ROS: Dementia    MEDICATIONS AT HOME:   Prior to Admission medications   Medication Sig Start Date End Date Taking? Authorizing Provider  acetaminophen (TYLENOL) 325 MG tablet Take 2 tablets (650 mg total) by mouth every 6 (six) hours as needed for mild pain (or Fever >/= 101). 05/18/17  Yes Gouru, Illene Silver, MD  acetaminophen (TYLENOL) 325 MG tablet Take 650 mg by mouth 4 (four) times daily.   Yes [provider]  albuterol (ACCUNEB) 0.63 MG/3ML nebulizer solution Take 1 ampule by nebulization every 4 (four) hours as needed for wheezing or shortness of breath.  Yes [provider]  alendronate (FOSAMAX) 70 MG tablet Take 70 mg by mouth every Sunday.  02/12/17  Yes [provider]  aspirin EC 81 MG tablet Take 81 mg by mouth daily.   Yes [provider]  Calcium Carbonate-Vitamin D3 (CALCIUM 600-D) 600-400 MG-UNIT TABS Take 1 tablet by mouth 2 (two) times daily. With lunch and dinner   Yes [provider]  citalopram (CELEXA) 10 MG tablet Take 10 mg by mouth daily.   Yes [provider]  dicyclomine (BENTYL) 20 MG tablet Take 20 mg by mouth 2 (two) times daily.  03/09/18  Yes [provider]  donepezil (ARICEPT) 10 MG tablet Take 10 mg by mouth at  bedtime.    Yes [provider]  feeding supplement, ENSURE ENLIVE, (ENSURE ENLIVE) LIQD Take 237 mLs by mouth 2 (two) times daily between meals. 05/27/18  Yes Pyreddy, Reatha Harps, MD  fluticasone (FLONASE) 50 MCG/ACT nasal spray Place 2 sprays into both nostrils at bedtime.    Yes [provider]  furosemide (LASIX) 20 MG tablet Take 20 mg by mouth 2 (two) times daily.  07/04/18 07/06/18 Yes [provider]  gabapentin (NEURONTIN) 600 MG tablet Take 600 mg by mouth 3 (three) times daily.    Yes [provider]  levothyroxine (SYNTHROID, LEVOTHROID) 125 MCG tablet Take 125 mcg by mouth daily before breakfast.    Yes [provider]  lidocaine (LIDODERM) 5 % Place 1 patch onto the skin daily. Remove & Discard patch within 12 hours or as directed by MD 06/08/18  Yes Max Sane, MD  Melatonin 3 MG TABS Take 6 mg by mouth at bedtime.   Yes [provider]  Multiple Vitamin (MULTIVITAMIN WITH MINERALS) TABS tablet Take 1 tablet by mouth daily.    Yes [provider]  pantoprazole (PROTONIX) 40 MG tablet Take 1 tablet (40 mg total) by mouth 2 (two) times daily. 06/23/17  Yes Vonda Antigua B, MD  potassium chloride SA (K-DUR,KLOR-CON) 20 MEQ tablet Take 20 mEq by mouth every evening.    Yes [provider]  Saccharomyces boulardii (PROBIOTIC) 250 MG CAPS Take 250 mg by mouth 2 (two) times daily.   Yes [provider]  traMADol (ULTRAM) 50 MG tablet Take 1 tablet (50 mg total) by mouth every 8 (eight) hours as needed for moderate pain or severe pain. 05/30/18  Yes Ted Leonhart, Alveta Heimlich, MD  vitamin C (ASCORBIC ACID) 250 MG tablet Take 500 mg by mouth 2 (two) times daily.   Yes [provider]     VITAL SIGNS:  Blood pressure (!) 86/68, pulse 84, temperature 97.7 F (36.5 C), temperature source Oral, resp. rate 18, height 5\' 4"  (1.626 m), weight 71.7 kg, SpO2 91 %.  PHYSICAL EXAMINATION:  Physical Exam  GENERAL:  85  y.o.-year-old patient lying in the bed EYES: Pupils equal, round, reactive to light and accommodation. No scleral icterus. Extraocular muscles intact.  HEENT: Head atraumatic, normocephalic. Oropharynx and nasopharynx clear. No oropharyngeal erythema, moist oral mucosa  NECK:  Supple, no jugular venous distention. No thyroid enlargement, no tenderness.  LUNGS: Decreased air entry bilaterally CARDIOVASCULAR: S1, S2 normal. No murmurs, rubs, or gallops.  ABDOMEN: Soft, nontender, nondistended. Bowel sounds present. No organomegaly or mass.  EXTREMITIES:  bilateral lower extremity edema NEUROLOGIC: Not following instructions PSYCHIATRIC: The patient is drowsy.  Lethargic  LABORATORY PANEL:   CBC Recent Labs  Lab 07/05/18 1410  WBC 13.1*  HGB 12.3  HCT 38.5  PLT  198   ------------------------------------------------------------------------------------------------------------------  Chemistries  Recent Labs  Lab 07/05/18 1410  NA 141  K 4.7  CL 108  CO2 26  GLUCOSE 111*  BUN 32*  CREATININE 1.08*  CALCIUM 8.7*  AST 58*  ALT 26  ALKPHOS 342*  BILITOT 1.5*   ------------------------------------------------------------------------------------------------------------------  Cardiac Enzymes Recent Labs  Lab 07/05/18 1410  TROPONINI 0.04*   ------------------------------------------------------------------------------------------------------------------  RADIOLOGY:  Dg Chest 1 View  Result Date: 07/05/2018 CLINICAL DATA:  COPD.  Multiple falls.  Dementia. EXAM: CHEST  1 VIEW COMPARISON:  Chest x-ray 06/13/2018, 05/15/2017. FINDINGS: Mediastinum and hilar structures normal. Low lung volumes with mild bibasilar atelectasis. Chronic interstitial changes. No focal alveolar infiltrate. No pleural effusion or pneumothorax. Degenerative changes and osteopenia thoracic spine. Lower thoracic vertebral body compression fracture. Surgical clips right shoulder. IMPRESSION: Low lung  volumes with mild bibasilar atelectasis. Chronic interstitial changes. No acute abnormality identified Electronically Signed   By: Marcello Moores  Register   On: 07/05/2018 14:05     IMPRESSION AND PLAN:   *Dehydration secondary to diuresis.  Hold Lasix.  Start IV fluids. Hypotension secondary to dehydration has improved with bolus.  Monitor for fluid overload  *Anasarca.  This is likely due to cirrhosis.  Hypoalbuminemia with albumin 2.  Patient was recently diagnosed with congestive heart failure with the anasarca.  No echocardiogram was done.  Lasix on hold due to hypotension  *Acute metabolic encephalopathy with worsening dementia.  Secondary to dehydration.  Patient has been declining slowly over the past few days.  Monitor.  Dementia  Patient will be admitted for IV fluids.  Son is trying to decide if he wants to transition to full comfort measures.  Palliative care consulted.  All the records are reviewed and case discussed with ED provider. Management plans discussed with the patient, family and they are in agreement.  CODE STATUS: DO NOT RESUSCITATE and DO NOT INTUBATE  TOTAL TIME TAKING CARE OF THIS PATIENT: 40 minutes.   Leia Alf Jasman Murri M.D on 07/05/2018 at 4:13 PM  Between 7am to 6pm - Pager - [phone]  After 6pm go to [email or web address] - password [email or web address] [facility]  Office  [phone]  CC: Primary care physician; Derinda Late, MD  Note: This dictation was prepared with Dragon dictation along with smaller phrase technology. Any transcriptional errors that result from this process are unintentional.

## 2018-07-05 NOTE — Progress Notes (Signed)
Patient unable to answer admission questions 

## 2018-07-05 NOTE — ED Notes (Signed)
Report was called and given to the floor.   

## 2018-07-05 NOTE — Progress Notes (Addendum)
Please note,. Patient is currently followed by outpatient Palliative at [facility]. CSW Annamaria Boots made aware.  Flo Shanks RN, BSN, [place] [facility] [place], hospital Liaison [phone]

## 2018-07-05 NOTE — ED Notes (Signed)
Brittany RN, aware of bed assigned  

## 2018-07-05 NOTE — ED Notes (Signed)
EDP is here at the bedside with pt

## 2018-07-05 NOTE — ED Provider Notes (Addendum)
[facility] Emergency Department Provider Note  ____________________________________________  Time seen: Approximately 3:08 PM  I have reviewed the triage vital signs and the nursing notes.   HISTORY  Chief Complaint Altered Mental Status  Level 5 Caveat: Portions of the History and Physical including HPI and review of systems are unable to be completely obtained due to patient being a poor historian    HPI Tina [name] is a 85 y.o. female with a history of breast cancer, hypothyroidism, dementia  who was [facility] due to being confused and low energy today.  She is also had increased peripheral edema over the last few days and the doctor there started her on Lasix yesterday.  So far edema has not improved.  EMS note that the patient had hypotension with a blood pressure of about 80/60, and hypoxia to 88% on room air.     Past Medical History:  Diagnosis Date  . Anxiety   . Cancer ([place])    breast  . Dementia ([place])   . Hypothyroidism   . Shingles      Patient Active Problem List   Diagnosis Date Noted  . Palliative care encounter 06/10/2018  . Memory loss 06/10/2018  . Anorexia 06/10/2018  . Weakness generalized 06/10/2018  . Sepsis ([place]) 06/06/2018  . Hypoxia 05/28/2018  . Pressure injury of skin 05/24/2018  . Calculus of gallbladder without cholecystitis without obstruction   . Acute kidney injury ([place]) 05/23/2018  . Status post kyphoplasty 05/10/2018  . Wrist fracture, bilateral 03/23/2018  . Dementia in Alzheimer's disease ([name]) 05/24/2017  . GI bleed 05/15/2017  . Acute blood loss anemia 05/15/2017  . Acute encephalopathy 05/15/2017  . Tachycardia 05/15/2017  . AMD (age related macular degeneration) 01/03/2015  . Allergic rhinitis 12/12/2013  . Depressive disorder, not elsewhere classified 12/12/2013  . Hypothyroidism 12/12/2013  . Postherpetic neuralgia 12/12/2013     Past Surgical History:  Procedure  Laterality Date  . APPENDECTOMY    . BREAST SURGERY    . EYE SURGERY    . KYPHOPLASTY N/A 06/27/2015   Procedure: KYPHOPLASTY L4;  Surgeon: Hessie Knows, MD;  Location: [facility] ORS;  Service: Orthopedics;  Laterality: N/A;  . KYPHOPLASTY N/A 05/10/2018   Procedure: Manfred Arch;  Surgeon: Hessie Knows, MD;  Location: [facility] ORS;  Service: Orthopedics;  Laterality: N/A;  . ORIF WRIST FRACTURE Bilateral 03/24/2018   Procedure: OPEN REDUCTION INTERNAL FIXATION (ORIF) WRIST FRACTURE;  Surgeon: Hessie Knows, MD;  Location: [facility] ORS;  Service: Orthopedics;  Laterality: Bilateral;     Prior to Admission medications   Medication Sig Start Date End Date Taking? Authorizing Provider  acetaminophen (TYLENOL) 325 MG tablet Take 2 tablets (650 mg total) by mouth every 6 (six) hours as needed for mild pain (or Fever >/= 101). 05/18/17  Yes Gouru, Illene Silver, MD  acetaminophen (TYLENOL) 325 MG tablet Take 650 mg by mouth 4 (four) times daily.   Yes [provider]  albuterol (ACCUNEB) 0.63 MG/3ML nebulizer solution Take 1 ampule by nebulization every 4 (four) hours as needed for wheezing or shortness of breath.   Yes [provider]  alendronate (FOSAMAX) 70 MG tablet Take 70 mg by mouth every Sunday.  02/12/17  Yes [provider]  aspirin EC 81 MG tablet Take 81 mg by mouth daily.   Yes [provider]  Calcium Carbonate-Vitamin D3 (CALCIUM 600-D) 600-400 MG-UNIT TABS Take 1 tablet by mouth 2 (two) times daily. With lunch and dinner  Yes [provider]  citalopram (CELEXA) 10 MG tablet Take 10 mg by mouth daily.   Yes [provider]  dicyclomine (BENTYL) 20 MG tablet Take 20 mg by mouth 2 (two) times daily.  03/09/18  Yes [provider]  donepezil (ARICEPT) 10 MG tablet Take 10 mg by mouth at bedtime.    Yes [provider]  feeding supplement, ENSURE ENLIVE, (ENSURE ENLIVE) LIQD Take 237 mLs by mouth 2 (two) times daily between meals.  05/27/18  Yes Pyreddy, Reatha Harps, MD  fluticasone (FLONASE) 50 MCG/ACT nasal spray Place 2 sprays into both nostrils at bedtime.    Yes [provider]  furosemide (LASIX) 20 MG tablet Take 20 mg by mouth 2 (two) times daily.  07/04/18 07/06/18 Yes [provider]  gabapentin (NEURONTIN) 600 MG tablet Take 600 mg by mouth 3 (three) times daily.    Yes [provider]  levothyroxine (SYNTHROID, LEVOTHROID) 125 MCG tablet Take 125 mcg by mouth daily before breakfast.    Yes [provider]  lidocaine (LIDODERM) 5 % Place 1 patch onto the skin daily. Remove & Discard patch within 12 hours or as directed by MD 06/08/18  Yes Max Sane, MD  Melatonin 3 MG TABS Take 6 mg by mouth at bedtime.   Yes [provider]  Multiple Vitamin (MULTIVITAMIN WITH MINERALS) TABS tablet Take 1 tablet by mouth daily.    Yes [provider]  pantoprazole (PROTONIX) 40 MG tablet Take 1 tablet (40 mg total) by mouth 2 (two) times daily. 06/23/17  Yes Vonda Antigua B, MD  potassium chloride SA (K-DUR,KLOR-CON) 20 MEQ tablet Take 20 mEq by mouth every evening.    Yes [provider]  Saccharomyces boulardii (PROBIOTIC) 250 MG CAPS Take 250 mg by mouth 2 (two) times daily.   Yes [provider]  traMADol (ULTRAM) 50 MG tablet Take 1 tablet (50 mg total) by mouth every 8 (eight) hours as needed for moderate pain or severe pain. 05/30/18  Yes Sudini, Alveta Heimlich, MD  vitamin C (ASCORBIC ACID) 250 MG tablet Take 500 mg by mouth 2 (two) times daily.   Yes [provider]     Allergies Nsaids; Lactose intolerance (gi); and Meloxicam   Family History  Problem Relation Age of Onset  . CAD Mother   . CAD Father     Social History Social History   Tobacco Use  . Smoking status: Current Every Day Smoker    Packs/day: 0.50  . Smokeless tobacco: Never Used  Substance Use Topics  . Alcohol use: No  . Drug use: No    Review of  Systems  Constitutional:   No fever or chills.  ENT:   No sore throat. No rhinorrhea. Cardiovascular:   No chest pain or syncope. Respiratory:   No dyspnea or cough. Gastrointestinal:   Negative for abdominal pain, vomiting and diarrhea.  Musculoskeletal:   Negative for focal pain positive bilateral lower extremity edema. All other systems reviewed and are negative except as documented above in ROS and HPI.  ____________________________________________   PHYSICAL EXAM:  VITAL SIGNS: ED Triage Vitals  Enc Vitals Group     BP 07/05/18 1335 (!) 86/68     Pulse Rate 07/05/18 1335 84     Resp 07/05/18 1335 18     Temp 07/05/18 1335 97.7 F (36.5 C)     Temp Source 07/05/18 1335 Oral     SpO2 07/05/18 1335 91 %     Weight 07/05/18  1352 158 lb (71.7 kg)     Height 07/05/18 1352 5\' 4"  (1.626 m)     Head Circumference --      [facility] --      Pain Score --      Pain Loc --      Pain Edu? --      Excl. in [place]? --     Vital signs reviewed, nursing assessments reviewed.   Constitutional: Awake and alert, not oriented. Non-toxic appearance. Eyes:   Conjunctivae are normal. EOMI. PERRL. ENT      Head:   Normocephalic and atraumatic.      Nose:   No congestion/rhinnorhea.       Mouth/Throat:   Dry mucous membranes, no pharyngeal erythema. No peritonsillar mass.       Neck:   No meningismus. Full ROM. Hematological/Lymphatic/Immunilogical:   No cervical lymphadenopathy. Cardiovascular:   RRR. Symmetric bilateral radial and DP pulses.  No murmurs. Cap refill less than 2 seconds. Respiratory:   Normal respiratory effort without tachypnea/retractions. Breath sounds are clear and equal bilaterally. No wheezes/rales/rhonchi. Gastrointestinal:   Soft and nontender. Non distended. There is no CVA tenderness.  No rebound, rigidity, or guarding. Musculoskeletal:   Normal range of motion in all extremities. No joint effusions.  No lower extremity tenderness.  2+ pitting edema bilateral lower  extremities Neurologic:   Normal speech and language.  Motor grossly intact. No acute focal neurologic deficits are appreciated.  Skin:    Skin is warm, dry and intact. No rash noted.  No petechiae, purpura, or bullae.  ____________________________________________    LABS (pertinent positives/negatives) (all labs ordered are listed, but only abnormal results are displayed) Labs Reviewed  URINALYSIS, COMPLETE (UACMP) WITH MICROSCOPIC - Abnormal; Notable for the following components:      Result Value   Color, Urine AMBER (*)    APPearance CLEAR (*)    Bacteria, UA RARE (*)    All other components within normal limits  URINE CULTURE  COMPREHENSIVE METABOLIC PANEL  TROPONIN I  CBC WITH DIFFERENTIAL/PLATELET  BRAIN NATRIURETIC PEPTIDE   ____________________________________________   EKG  Interpreted by me Sinus rhythm rate of 94, normal axis and intervals.  Normal QRS and ST segments.  Isolated T wave inversion in lead III which is nonspecific.  ____________________________________________    RADIOLOGY  Dg Chest 1 View  Result Date: 07/05/2018 CLINICAL DATA:  COPD.  Multiple falls.  Dementia. EXAM: CHEST  1 VIEW COMPARISON:  Chest x-ray 06/13/2018, 05/15/2017. FINDINGS: Mediastinum and hilar structures normal. Low lung volumes with mild bibasilar atelectasis. Chronic interstitial changes. No focal alveolar infiltrate. No pleural effusion or pneumothorax. Degenerative changes and osteopenia thoracic spine. Lower thoracic vertebral body compression fracture. Surgical clips right shoulder. IMPRESSION: Low lung volumes with mild bibasilar atelectasis. Chronic interstitial changes. No acute abnormality identified Electronically Signed   By: Marcello Moores  Register   On: 07/05/2018 14:05    ____________________________________________   PROCEDURES Procedures  ____________________________________________  DIFFERENTIAL DIAGNOSIS   Dehydration, pneumonia, overdiuresis, electrolyte  disturbance, heart failure exacerbation, non-STEMI.  CLINICAL IMPRESSION / ASSESSMENT AND PLAN / ED COURSE  Pertinent labs & imaging results that were available during my care of the patient were reviewed by me and considered in my medical decision making (see chart for details).    Patient presents with hypotension, borderline oxygenation with 91% on room air in the ED, increased peripheral edema.  Highest suspicion for CHF exacerbation, possibly with concurrent intravascular depletion if systolic function is too poor.  Will check labs chest x-ray for further evaluation.  UA done outpatient yesterday essentially normal.  Due to hypotension I will give the patient a saline bolus.  ----------------------------------------- 3:14 PM on 07/05/2018 -----------------------------------------  Chest x-ray unremarkable.  Still waiting for labs.  Patient will eventually need hospitalization for echocardiogram and further cardiac work-up.  Care signed out to Dr. Cherylann Banas pending labs   ----------------------------------------- 3:28 PM on 07/05/2018 ----------------------------------------- Labs show troponin elevation of 0.04.  Slight elevation in creatinine compared to baseline.  Severe protein calorie malnutrition.  Slight leukocytosis of undetermined significance.  UA unimpressive.  Case discussed with hospitalist Dr. Darvin Neighbours for further evaluation and management.  Plan discussed with son at bedside who agrees.      ____________________________________________   FINAL CLINICAL IMPRESSION(S) / ED DIAGNOSES    Final diagnoses:  Peripheral edema  Hypotension, unspecified hypotension type     ED Discharge Orders    None      Portions of this note were generated with dragon dictation software. Dictation errors may occur despite best attempts at proofreading.   Carrie Mew, MD 07/05/18 [place]    Carrie Mew, MD 07/05/18 [phone]

## 2018-07-05 NOTE — ED Notes (Signed)
Date and time results received: 07/05/18 3:23 PM   Test: Troponin Critical Value: 0.04  Name of Provider Notified: Joni Fears

## 2018-07-05 NOTE — ED Notes (Signed)
Call to Lab regarding delay as blood was sent an hour ago

## 2018-07-05 NOTE — ED Notes (Signed)
Pt had her last BM today at 11:30.  She ate breakfast as her last meal.  Pt appears to be sleeping and appears in no distress at this time.  I notified family and caregiver that pt has order for morphine and to alert [place] if they feel that she is in pain.  They state that they "don't want her pumped full of morphine"

## 2018-07-05 NOTE — Progress Notes (Signed)
Advance care planning  Purpose of Encounter Dehydration, anasarca, goals of care  Parties in Attendance Patient, son-Tina [name] and her caregiver Tina [name]  Patients Decisional capacity Patient with dementia and encephalopathy.  Unable to participate in discussion.  Documented healthcare power of attorney is Tina [name]  Patient brought to the emergency room due to worsening mental status and thought to be dehydrated.  Here found to be hypotensive and improved with IV fluids.  Continues to be lethargic and seems uncomfortable.  Patient had 2 recent admissions in December for generalized weakness and later for UTI.  Discharged to skilled nursing facility.  I saw the patient in early December and she has declined significantly.  She was supposed to go home with hospice today from [facility] due to being discharged as she was unable to participate in physical therapy anymore.  Poor oral intake.  Was drinking some protein shake. Significant decline in the nursing home.  Previously Tina wanted patient to be a full code as he believed that DO NOT RESUSCITATE status would mean no care.  Recently he changed his mind and due to patient enrolling in hospice services he has changed CODE STATUS to DO NOT RESUSCITATE DO NOT INTUBATE.  At this time we discussed regarding concentrating more on comfort which he said were the goals.  Discussed regarding comfort measures.  That he wants to think further but at this point is requesting that we treat with IV fluids for dehydration and see how patient does.  He wants to treat treatable at this point.  Continue DNR/DNI status. He is considering comfort measures but wants to see how patient does overnight.  He wants patient discharged home with hospice depending on her progress.  Time spent - 25 minutes

## 2018-07-05 NOTE — ED Notes (Signed)
Placed pt on 2L [place].

## 2018-07-06 DIAGNOSIS — Z7189 Other specified counseling: Secondary | ICD-10-CM

## 2018-07-06 DIAGNOSIS — E860 Dehydration: Principal | ICD-10-CM

## 2018-07-06 DIAGNOSIS — R627 Adult failure to thrive: Secondary | ICD-10-CM

## 2018-07-06 DIAGNOSIS — Z515 Encounter for palliative care: Secondary | ICD-10-CM

## 2018-07-06 DIAGNOSIS — F0390 Unspecified dementia without behavioral disturbance: Secondary | ICD-10-CM

## 2018-07-06 LAB — BASIC METABOLIC PANEL
Anion gap: 7 (ref 5–15)
BUN: 36 mg/dL — ABNORMAL HIGH (ref 8–23)
CO2: 26 mmol/L (ref 22–32)
Calcium: 8.3 mg/dL — ABNORMAL LOW (ref 8.9–10.3)
Chloride: 110 mmol/L (ref 98–111)
Creatinine, Ser: 1.17 mg/dL — ABNORMAL HIGH (ref 0.44–1.00)
GFR calc Af Amer: 48 mL/min — ABNORMAL LOW (ref >60)
GFR, EST NON AFRICAN AMERICAN: 42 mL/min — AB (ref >60)
Glucose, Bld: 84 mg/dL (ref 70–99)
POTASSIUM: 4.9 mmol/L (ref 3.5–5.1)
SODIUM: 143 mmol/L (ref 135–145)

## 2018-07-06 LAB — URINE CULTURE: Culture: >=100000 — AB

## 2018-07-06 LAB — CBC
HCT: 35.1 % — ABNORMAL LOW (ref 36.0–46.0)
Hemoglobin: 11.2 g/dL — ABNORMAL LOW (ref 12.0–15.0)
MCH: 28.5 pg (ref 26.0–34.0)
MCHC: 31.9 g/dL (ref 30.0–36.0)
MCV: 89.3 fL (ref 80.0–100.0)
Platelets: 171 10*3/uL (ref 150–400)
RBC: 3.93 MIL/uL (ref 3.87–5.11)
RDW: 21 % — ABNORMAL HIGH (ref 11.5–15.5)
WBC: 12 10*3/uL — ABNORMAL HIGH (ref 4.0–10.5)
nRBC: 0 % (ref 0.0–0.2)

## 2018-07-06 LAB — MRSA PCR SCREENING: MRSA by PCR: NEGATIVE

## 2018-07-06 MED ORDER — FUROSEMIDE 20 MG PO TABS: 20.0000 mg | ORAL_TABLET | Freq: Every day | ORAL | 0 refills | Status: DC

## 2018-07-06 MED ORDER — ENSURE ENLIVE PO LIQD
237.0000 mL | Freq: Two times a day (BID) | ORAL | Status: DC
  Administered 2018-07-06 (×2): 237 mL via ORAL

## 2018-07-06 NOTE — Progress Notes (Signed)
Called EMS for transport for discharge home.   Fuller Mandril, RN

## 2018-07-06 NOTE — Progress Notes (Signed)
Tina [name] to be D/C'd Home per MD order.  Discussed prescriptions and follow up appointments with the patient. Prescriptions given to patient, medication list explained in detail. Pt verbalized understanding.  Allergies as of 07/06/2018      Reactions   Nsaids Other (See Comments)   H/O GI BLEED   Lactose Intolerance (gi)    Meloxicam    Other reaction(s): Other (See Comments) Upper GI Bleeding      Medication List    STOP taking these medications   alendronate 70 MG tablet Commonly known as:  FOSAMAX   aspirin EC 81 MG tablet   CALCIUM 600-D 600-400 MG-UNIT Tabs Generic drug:  Calcium Carbonate-Vitamin D3   dicyclomine 20 MG tablet Commonly known as:  BENTYL   fluticasone 50 MCG/ACT nasal spray Commonly known as:  FLONASE   furosemide 20 MG tablet Commonly known as:  LASIX   lidocaine 5 % Commonly known as:  LIDODERM   multivitamin with minerals Tabs tablet   pantoprazole 40 MG tablet Commonly known as:  PROTONIX   potassium chloride SA 20 MEQ tablet Commonly known as:  K-DUR,KLOR-CON   Probiotic 250 MG Caps   vitamin C 250 MG tablet Commonly known as:  ASCORBIC ACID     TAKE these medications   acetaminophen 325 MG tablet Commonly known as:  TYLENOL Take 2 tablets (650 mg total) by mouth every 6 (six) hours as needed for mild pain (or Fever >/= 101). What changed:  Another medication with the same name was removed. Continue taking this medication, and follow the directions you see here.   albuterol 0.63 MG/3ML nebulizer solution Commonly known as:  ACCUNEB Take 1 ampule by nebulization every 4 (four) hours as needed for wheezing or shortness of breath.   citalopram 10 MG tablet Commonly known as:  CELEXA Take 10 mg by mouth daily.   donepezil 10 MG tablet Commonly known as:  ARICEPT Take 10 mg by mouth at bedtime.   feeding supplement (ENSURE ENLIVE) Liqd Take 237 mLs by mouth 2 (two) times daily between meals.   gabapentin 600 MG  tablet Commonly known as:  NEURONTIN Take 600 mg by mouth 3 (three) times daily.   levothyroxine 125 MCG tablet Commonly known as:  SYNTHROID, LEVOTHROID Take 125 mcg by mouth daily before breakfast.   Melatonin 3 MG Tabs Take 6 mg by mouth at bedtime.   traMADol 50 MG tablet Commonly known as:  ULTRAM Take 1 tablet (50 mg total) by mouth every 8 (eight) hours as needed for moderate pain or severe pain.       Vitals:   07/06/18 1202 07/06/18 1722  BP: (!) 93/55 99/60  Pulse: 100 95  Resp: 19 (!) 24  Temp: (!) 97.2 F (36.2 C) 98.1 F (36.7 C)  SpO2: 96% 94%    Skin clean, dry and intact without evidence of skin break down, no evidence of skin tears noted. IV catheter discontinued intact. Site without signs and symptoms of complications. Dressing and pressure applied. Pt denies pain at this time. No complaints noted.  An After Visit Summary was printed and given to the patient. Patient escorted via [place], and D/C home via private auto.  Fuller Mandril, RN

## 2018-07-06 NOTE — Care Management Note (Signed)
Case Management Note  Patient Details  Name: Tina [name] MRN: [id number] Date of Birth: 10-07-1927   Patient to discharge home today.  Patient was discharged from [facility] yesterday.  Patient was in transition of transitioning from outpatient palliative to hospice at home with [facility].  Assessment completed with son [name].  Patient lives in her own home.  Patient has private pay caregivers 24/7.  Son also lives locally for support.   PCP Baboff.  Per son PCP has already agreed to home Hospice. Patient has lift chair, WC, elevated toilet seat and grab bars in the home.  Patient also has a hospital bed, but son states they may need a new one.    Son would like to move forward with discharge home with hospice through [facility].  Kara with Hospice notified of discharge.  Per Marcene Brawn they have all the documentation they need, and patient will be admitted at home today.  EMS packet and signed DNR on chart.     Subjective/Objective:                    Action/Plan:   Expected Discharge Date:  07/06/18               Expected Discharge Plan:  Home w Hospice Care  In-House Referral:     Discharge planning Services  CM Consult  Post Acute Care Choice:  Hospice Choice offered to:  Adult Children  DME Arranged:    DME Agency:     HH Arranged:    HH Agency:  [facility]  Status of Service:  Completed, signed off  If discussed at [place] of Stay Meetings, dates discussed:    Additional Comments:  Beverly Sessions, RN 07/06/2018, 3:40 PM

## 2018-07-06 NOTE — Consult Note (Signed)
Consultation Note Date: 07/06/2018   Patient Name: Tina [name]  DOB: 07/14/1927  MRN: [id number]  Age / Sex: 85 y.o., female  PCP: Babaoff, Marcus, MD Referring Physician: Vachhani, Vaibhavkumar, *  Reason for Consultation: Establishing goals of care  HPI/Patient Profile: 85 y.o. female  with past medical history of dementia, CHF, cirrhosis, hypothyroidism, depression, anxiety, breast cancer, COPD, and multiple falls admitted on 07/05/2018 with weakness and lethargy. She had developed anasarca and was started on lasix. She was found to be hypotensive. Lasix was stopped and IV fluids were started. Albumin is 2. Patient was supposed to be discharged home from [facility] on day of admission to hospital. Patient had been seen by palliative care outpatient and son was ready to transition to hospice. PMT consulted for GOC.  Clinical Assessment and Goals of Care: I have reviewed medical records including EPIC notes, labs and imaging, received report from RN, assessed the patient and then met with patient's son, [name], and patient's private caregiver to discuss diagnosis prognosis, GOC, EOL wishes, disposition and options.  I introduced Palliative Medicine as specialized medical care for people living with serious illness. It focuses on providing relief from the symptoms and stress of a serious illness. The goal is to improve quality of life for both the patient and the family.  We discussed a brief life review of the patient. 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 She has one daughter and one son.   As far as functional and nutritional status, her son tells me of a decline. Tells me she has not been ambulatory for the past 2 weeks and was being discharged from [facility] d/t her inability to participate in rehab. She is dependent in ADLs. He tells me of a poor appetite recently along with a significant weight loss.     We discussed her current illness and  what it means in the larger context of her on-going co-morbidities.  Natural disease trajectory and expectations at EOL were discussed. Discussed dementia progression, poor PO intake, and heart failure. Son is incredibly concerned about lower extremity edema - we discussed the role of low albumin along with heart failure.  I attempted to elicit values and goals of care important to the patient. Son tells me the main goal is to get the patient home. He has hired 24/7 caregivers.     The difference between aggressive medical intervention and comfort care was considered in light of the patient's goals of care. We discussed focusing on the patient's comfort and avoiding aggressive medical interventions, future hospitalizations. He agrees with this.   We discussed that patient is likely nearing end of life. Son tells me he is aware. He is hopeful she will be able to increase her intake once she gets home but understands that she may not and wants to keep her comfortable at home.  He is quite fearful of morphine - we discussed role of morphine if patient is having pain or shortness of breath and he tells me he does not want it administered - at this time patient is not and has not been symptomatic - will discontinue. We discussed that she may come to a point where she needs medication to assist with shortness of breath.  Advance directives, concepts specific to code status, artifical feeding and hydration, and rehospitalization were considered and discussed. Patient is DNR.   Hospice and Palliative Care services outpatient were explained and offered. Son would like hospice care at home.  Questions and   concerns were addressed. The family was encouraged to call with questions or concerns.   Primary Decision Maker NEXT OF KIN - son - Brinleigh Tew  SUMMARY OF RECOMMENDATIONS    - home with hospice care - son hopeful for discharge today - Son is fearful of morphine - discussed this - currently asymptomatic  - will discontinue - we discussed that she may become short of breath and need medication to assist with comfort - son would like to limit medications as much as possible but continue celexa, aricept, gabapentin, synthroid, and melatonin Started ensure per son's request Soft diet ordered per son's request - he reports no history of dysphagia D/c IV fluids  Code Status/Advance Care Planning:  DNR  Palliative Prophylaxis:   Aspiration, Bowel Regimen, Delirium Protocol, Frequent Pain Assessment, Oral Care and Turn Reposition  Additional Recommendations (Limitations, Scope, Preferences):  Avoid Hospitalization, Minimize Medications, Initiate Comfort Feeding, No Artificial Feeding and No IV Fluids  Psycho-social/Spiritual:   Desire for further Chaplaincy support:no  Additional Recommendations: Education on Hospice  Prognosis:   Unable to determine - poor prognosis - poor PO intake, advanced dementia, heart failure  Discharge Planning: Home with Hospice      Primary Diagnoses: Present on Admission: . Dehydration   I have reviewed the medical record, interviewed the patient and family, and examined the patient. The following aspects are pertinent.  Past Medical History:  Diagnosis Date  . Anxiety   . Cancer ([place])    breast  . Dementia ([place])   . Hypothyroidism   . Shingles    Social History   Socioeconomic History  . Marital status: Widowed    Spouse name: Not on file  . Number of children: Not on file  . Years of education: Not on file  . Highest education level: Not on file  Occupational History  . Not on file  Social Needs  . Financial resource strain: Not on file  . Food insecurity:    Worry: Not on file    Inability: Not on file  . Transportation needs:    Medical: Not on file    Non-medical: Not on file  Tobacco Use  . Smoking status: Current Every Day Smoker    Packs/day: 0.50  . Smokeless tobacco: Never Used  Substance and Sexual Activity  .  Alcohol use: No  . Drug use: No  . Sexual activity: Not on file  Lifestyle  . Physical activity:    Days per week: Not on file    Minutes per session: Not on file  . Stress: Not on file  Relationships  . Social connections:    Talks on phone: Not on file    Gets together: Not on file    Attends religious service: Not on file    Active member of club or organization: Not on file    Attends meetings of clubs or organizations: Not on file    Relationship status: Not on file  Other Topics Concern  . Not on file  Social History Narrative  . Not on file   Family History  Problem Relation Age of Onset  . CAD Mother   . CAD Father    Scheduled Meds: . citalopram  10 mg Oral Daily  . donepezil  10 mg Oral QHS  . enoxaparin (LOVENOX) injection  40 mg Subcutaneous Q24H  . feeding supplement (ENSURE ENLIVE)  237 mL Oral BID BM  . gabapentin  600 mg Oral TID  . [START ON 07/07/2018]  levothyroxine  125 mcg Oral QAC breakfast  . Melatonin  5 mg Oral QHS   Continuous Infusions: . sodium chloride 50 mL/hr at 07/06/18 0700   PRN Meds:.acetaminophen **OR** acetaminophen, albuterol, ondansetron **OR** ondansetron (ZOFRAN) IV, polyethylene glycol, traMADol Allergies  Allergen Reactions  . Nsaids Other (See Comments)    H/O GI BLEED  . Lactose Intolerance (Gi)   . Meloxicam     Other reaction(s): Other (See Comments) Upper GI Bleeding   Review of Systems  Unable to perform ROS: Dementia    Physical Exam Constitutional:      General: She is not in acute distress.    Comments: Frail, elderly, temporal wasting  Cardiovascular:     Rate and Rhythm: Normal rate and regular rhythm.  Pulmonary:     Effort: Pulmonary effort is normal.     Breath sounds: Normal breath sounds.  Abdominal:     General: There is distension.     Tenderness: There is no abdominal tenderness.  Musculoskeletal:     Right lower leg: Edema present.     Left lower leg: Edema present.  Skin:    General: Skin  is warm and dry.     Findings: Petechiae present.     Comments: Noted on chest  Neurological:     Mental Status: She is lethargic and disoriented.  Psychiatric:        Cognition and Memory: Cognition is impaired. Memory is impaired.     Vital Signs: BP 96/61 (BP Location: Left Arm)   Pulse 96   Temp 97.6 F (36.4 C) (Oral)   Resp 16   Ht 5' 4" (1.626 m)   Wt 72 kg   SpO2 100%   BMI 27.24 kg/m  Pain Scale: Faces   Pain Score: Asleep   SpO2: SpO2: 100 % O2 Device:SpO2: 100 % O2 Flow Rate: .O2 Flow Rate (L/min): 2 L/min  IO: Intake/output summary:   Intake/Output Summary (Last 24 hours) at 07/06/2018 1156 Last data filed at 07/06/2018 0700 Gross per 24 hour  Intake 1231.56 ml  Output 0 ml  Net 1231.56 ml    LBM:   Baseline Weight: Weight: 71.7 kg Most recent weight: Weight: 72 kg     Palliative Assessment/Data: PPS 20%    Time Total: 70 minutes Greater than 50%  of this time was spent counseling and coordinating care related to the above assessment and plan.   Lee , DNP, AGNP-C Palliative Medicine Team [phone] Pager: [phone]  

## 2018-07-06 NOTE — Discharge Summary (Signed)
[facility] NAME: Tina [name]    MR#:  [id number]  DATE OF BIRTH:  December 28, 1927  DATE OF ADMISSION:  07/05/2018 ADMITTING PHYSICIAN: Hillary Bow, MD  DATE OF DISCHARGE: 07/06/2018   PRIMARY CARE PHYSICIAN: Derinda Late, MD    ADMISSION DIAGNOSIS:  Peripheral edema [R60.9] Hypotension, unspecified hypotension type [I95.9]  DISCHARGE DIAGNOSIS:  Active Problems:   Dehydration   Goals of care, counseling/discussion   Adult failure to thrive   Palliative care by specialist   Dementia without behavioral disturbance ([name])   SECONDARY DIAGNOSIS:   Past Medical History:  Diagnosis Date  . Anxiety   . Cancer ([place])    breast  . Dementia ([place])   . Hypothyroidism   . Shingles     HOSPITAL COURSE:   *Dehydration secondary to diuresis.  Hold Lasix.  Start IV fluids. Hypotension secondary to dehydration has improved with bolus.  Monitor for fluid overload  *Anasarca.  This is likely due to cirrhosis.  Hypoalbuminemia with albumin 2.  Patient was recently diagnosed with congestive heart failure with the anasarca.  No echocardiogram was done.  Lasix on hold due to hypotension  *Acute metabolic encephalopathy with worsening dementia.  Secondary to dehydration.  Patient has been declining slowly over the past few days.  Monitor.  Dementia  Patient will be admitted for IV fluids.  Son is trying to decide if he wants to transition to full comfort measures.  Palliative care consulted.  After meeting with palliative care son has agreed on taking her home with hospice with minimal medications.  We made arrangements for her discharge at home.  DISCHARGE CONDITIONS:   Stable.  CONSULTS OBTAINED:    DRUG ALLERGIES:   Allergies  Allergen Reactions  . Nsaids Other (See Comments)    H/O GI BLEED  . Lactose Intolerance ([place])   . Meloxicam     Other reaction(s): Other (See Comments) Upper GI Bleeding    DISCHARGE  MEDICATIONS:   Allergies as of 07/06/2018      Reactions   Nsaids Other (See Comments)   H/O GI BLEED   Lactose Intolerance (gi)    Meloxicam    Other reaction(s): Other (See Comments) Upper GI Bleeding      Medication List    STOP taking these medications   alendronate 70 MG tablet Commonly known as:  FOSAMAX   aspirin EC 81 MG tablet   CALCIUM 600-D 600-400 MG-UNIT Tabs Generic drug:  Calcium Carbonate-Vitamin D3   dicyclomine 20 MG tablet Commonly known as:  BENTYL   fluticasone 50 MCG/ACT nasal spray Commonly known as:  FLONASE   furosemide 20 MG tablet Commonly known as:  LASIX   lidocaine 5 % Commonly known as:  LIDODERM   multivitamin with minerals Tabs tablet   pantoprazole 40 MG tablet Commonly known as:  PROTONIX   potassium chloride SA 20 MEQ tablet Commonly known as:  K-DUR,KLOR-CON   Probiotic 250 MG Caps   vitamin C 250 MG tablet Commonly known as:  ASCORBIC ACID     TAKE these medications   acetaminophen 325 MG tablet Commonly known as:  TYLENOL Take 2 tablets (650 mg total) by mouth every 6 (six) hours as needed for mild pain (or Fever >/= 101). What changed:  Another medication with the same name was removed. Continue taking this medication, and follow the directions you see here.   albuterol 0.63 MG/3ML nebulizer solution Commonly known as:  ACCUNEB Take  1 ampule by nebulization every 4 (four) hours as needed for wheezing or shortness of breath.   citalopram 10 MG tablet Commonly known as:  CELEXA Take 10 mg by mouth daily.   donepezil 10 MG tablet Commonly known as:  ARICEPT Take 10 mg by mouth at bedtime.   feeding supplement (ENSURE ENLIVE) Liqd Take 237 mLs by mouth 2 (two) times daily between meals.   gabapentin 600 MG tablet Commonly known as:  NEURONTIN Take 600 mg by mouth 3 (three) times daily.   levothyroxine 125 MCG tablet Commonly known as:  SYNTHROID, LEVOTHROID Take 125 mcg by mouth daily before breakfast.    Melatonin 3 MG Tabs Take 6 mg by mouth at bedtime.   traMADol 50 MG tablet Commonly known as:  ULTRAM Take 1 tablet (50 mg total) by mouth every 8 (eight) hours as needed for moderate pain or severe pain.        DISCHARGE INSTRUCTIONS:    Hospice care at home.  If you experience worsening of your admission symptoms, develop shortness of breath, life threatening emergency, suicidal or homicidal thoughts you must seek medical attention immediately by calling 911 or calling your MD immediately  if symptoms less severe.  You Must read complete instructions/literature along with all the possible adverse reactions/side effects for all the Medicines you take and that have been prescribed to you. Take any new Medicines after you have completely understood and accept all the possible adverse reactions/side effects.   Please note  You were cared for by a hospitalist during your hospital stay. If you have any questions about your discharge medications or the care you received while you were in the hospital after you are discharged, you can call the unit and asked to speak with the hospitalist on call if the hospitalist that took care of you is not available. Once you are discharged, your primary care physician will handle any further medical issues. Please note that NO REFILLS for any discharge medications will be authorized once you are discharged, as it is imperative that you return to your primary care physician (or establish a relationship with a primary care physician if you do not have one) for your aftercare needs so that they can reassess your need for medications and monitor your lab values.    Today   CHIEF COMPLAINT:   Chief Complaint  Patient presents with  . Altered Mental Status    HISTORY OF PRESENT ILLNESS:  Tina [name]  is a 85 y.o. female with a known history of dementia, CHF, cirrhosis, hypothyroidism, depression, anxiety [facility]  due to weakness and lethargy.  Patient was discharged from the hospital in December 2019 [facility].  She has slowly declined.  Developed significant anasarca and has been started on Lasix recently.  Due to worsening patient has been enrolled in home with hospice services and was supposed to be discharged today home.  But son was concerned that she is not doing well and requested ER to visit.  Here patient was found to be hypotensive with systolic blood pressure in the 80s.  Bolus of 500 normal saline improved blood pressure.  Patient is lethargic and unable to give any history.  Seems to be uncomfortable with pain.  Patient is being admitted for IV fluids.   VITAL SIGNS:  Blood pressure (!) 93/55, pulse 100, temperature (!) 97.2 F (36.2 C), temperature source Axillary, resp. rate 19, height 5\' 4"  (1.626  m), weight 72 kg, SpO2 96 %.  I/O:    Intake/Output Summary (Last 24 hours) at 07/06/2018 1410 Last data filed at 07/06/2018 1309 Gross per 24 hour  Intake 1774.74 ml  Output 0 ml  Net 1774.74 ml    PHYSICAL EXAMINATION:  GENERAL:  85 y.o.-year-old patient lying in the bed EYES: Pupils equal, round, reactive to light and accommodation. No scleral icterus. Extraocular muscles intact.  HEENT: Head atraumatic, normocephalic. Oropharynx and nasopharynx clear. No oropharyngeal erythema, moist oral mucosa  NECK:  Supple, no jugular venous distention. No thyroid enlargement, no tenderness.  LUNGS: Decreased air entry bilaterally CARDIOVASCULAR: S1, S2 normal. No murmurs, rubs, or gallops.  ABDOMEN: Soft, nontender, nondistended. Bowel sounds present. No organomegaly or mass.  EXTREMITIES:  bilateral lower extremity edema NEUROLOGIC: Not following instructions PSYCHIATRIC: The patient is drowsy.  Lethargic  DATA REVIEW:   CBC Recent Labs  Lab 07/06/18 0251  WBC 12.0*  HGB 11.2*  HCT 35.1*  PLT 171    Chemistries  Recent Labs  Lab 07/05/18 1410  07/06/18 0251  NA 141 143  K 4.7 4.9  CL 108 110  CO2 26 26  GLUCOSE 111* 84  BUN 32* 36*  CREATININE 1.08* 1.17*  CALCIUM 8.7* 8.3*  AST 58*  --   ALT 26  --   ALKPHOS 342*  --   BILITOT 1.5*  --     Cardiac Enzymes Recent Labs  Lab 07/05/18 1410  TROPONINI 0.04*    Microbiology Results  Results for orders placed or performed during the hospital encounter of 07/05/18  MRSA PCR Screening     Status: None   Collection Time: 07/06/18  8:55 AM  Result Value Ref Range Status   MRSA by PCR NEGATIVE NEGATIVE Final    Comment:        The GeneXpert MRSA Assay (FDA approved for NASAL specimens only), is one component of a comprehensive MRSA colonization surveillance program. It is not intended to diagnose MRSA infection nor to guide or monitor treatment for MRSA infections. Performed at [facility], [address], [place],  [address]     RADIOLOGY:  Dg Chest 1 View  Result Date: 07/05/2018 CLINICAL DATA:  COPD.  Multiple falls.  Dementia. EXAM: CHEST  1 VIEW COMPARISON:  Chest x-ray 06/13/2018, 05/15/2017. FINDINGS: Mediastinum and hilar structures normal. Low lung volumes with mild bibasilar atelectasis. Chronic interstitial changes. No focal alveolar infiltrate. No pleural effusion or pneumothorax. Degenerative changes and osteopenia thoracic spine. Lower thoracic vertebral body compression fracture. Surgical clips right shoulder. IMPRESSION: Low lung volumes with mild bibasilar atelectasis. Chronic interstitial changes. No acute abnormality identified Electronically Signed   By: Marcello Moores  Register   On: 07/05/2018 14:05    EKG:   Orders placed or performed during the hospital encounter of 07/05/18  . EKG 12-Lead  . EKG 12-Lead  . EKG      Management plans discussed with the patient, family and they are in agreement.  CODE STATUS: DNR    Code Status Orders  (From admission, onward)         Start     Ordered   07/05/18 1609  Do not attempt  resuscitation (DNR)  Continuous    Question Answer Comment  In the event of cardiac or respiratory ARREST Do not call a "code blue"   In the event of cardiac or respiratory ARREST Do not perform Intubation, CPR, defibrillation or ACLS   In the event of cardiac or respiratory ARREST  Use medication by any route, position, wound care, and other measures to relive pain and suffering. May use oxygen, suction and manual treatment of airway obstruction as needed for comfort.      07/05/18 1610        Code Status History    Date Active Date Inactive Code Status Order ID Comments User Context   06/07/2018 0757 06/08/2018 2041 Full Code [id number]  Max Sane, MD Inpatient   06/07/2018 0751 06/07/2018 0757 DNR [id number]  Max Sane, MD Inpatient   06/07/2018 0207 06/07/2018 0751 Full Code [id number]  Amelia Jo, MD Inpatient   05/27/2018 1039 05/27/2018 1445 Full Code [id number]  Saundra Shelling, MD Inpatient   05/27/2018 0951 05/27/2018 1039 DNR [id number]  Saundra Shelling, MD Inpatient   05/25/2018 1823 05/27/2018 0951 Full Code [id number]  Hillary Bow, MD Inpatient   05/25/2018 1213 05/25/2018 1823 DNR [id number]  Jimmy Footman, NP Inpatient   05/23/2018 1544 05/25/2018 1213 Full Code [id number]  Loletha Grayer, MD ED   05/10/2018 1807 05/11/2018 1805 DNR [id number]  Hessie Knows, MD Inpatient   05/10/2018 1631 05/10/2018 1807 Full Code [id number]  Hessie Knows, MD Inpatient   03/23/2018 2050 03/26/2018 1705 Full Code [id number]  Loletha Grayer, MD ED   05/15/2017 1451 05/18/2017 1446 Full Code [id number]  Idelle Crouch, MD Inpatient   06/27/2015 1557 06/27/2015 1932 Full Code [id number]  Hessie Knows, MD Inpatient      TOTAL TIME TAKING CARE OF THIS PATIENT: 35 minutes.    Vaughan Basta M.D on 07/06/2018 at 2:10 PM  Between 7am to 6pm - Pager - [phone]  After 6pm go to [email or web address] - password [email or web address] [facility]  Office   [phone]  CC: Primary care physician; Derinda Late, MD   Note: This dictation was prepared with Dragon dictation along with smaller phrase technology. Any transcriptional errors that result from this process are unintentional.

## 2018-07-30 DEATH — deceased

## 2019-10-23 IMAGING — CT CT CERVICAL SPINE W/O CM
4 of 7 series · 12 of 33 positions shown, 14 images · non-contrast
Comparison: 06/06/2018

CLINICAL DATA: Unwitnessed fall.  Found on the floor.  Dementia.

EXAM:
CT HEAD WITHOUT CONTRAST
CT CERVICAL SPINE WITHOUT CONTRAST
TECHNIQUE: Multidetector CT imaging of the head and cervical spine was
performed following the standard protocol without intravenous
contrast. Multiplanar CT image reconstructions of the cervical spine
were also generated.

[Series 5: c spine soft (person_name) · axial · 0.26mm/px · z∈[-273,-201]mm · 3 of 74 slices shown]
[im 19/74  soft-tissue]
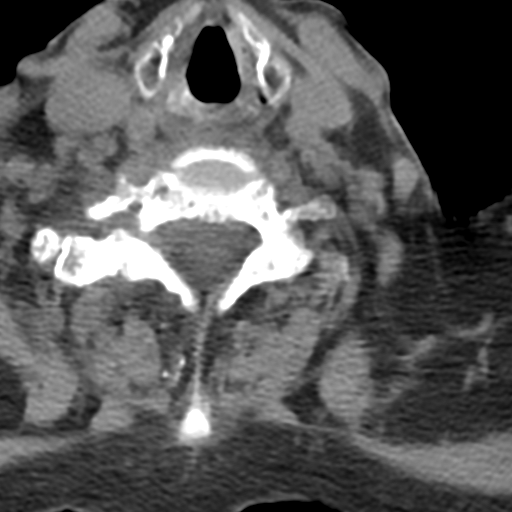
[im 37/74  soft-tissue]
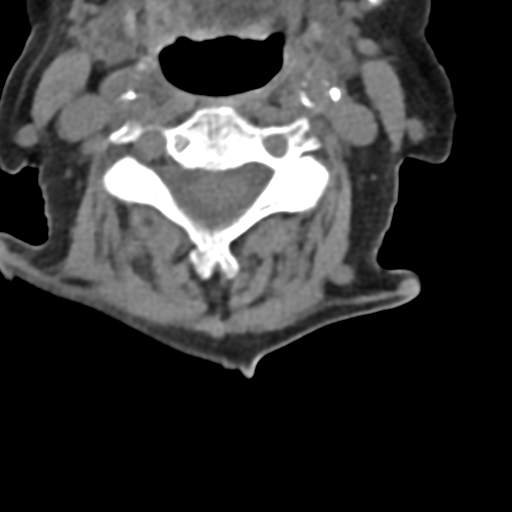
[im 55/74  soft-tissue]
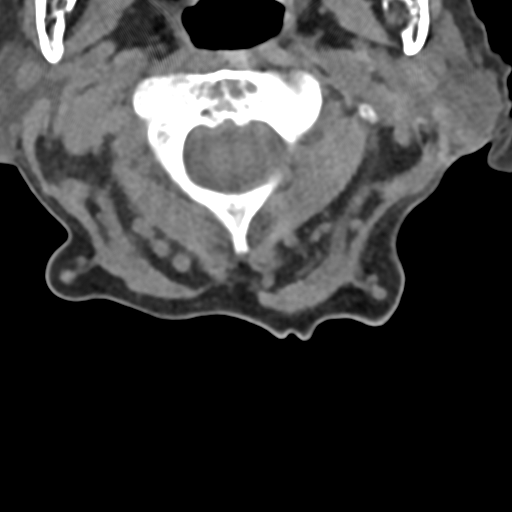

[Series 7: sagittal bone · sagittal · 0.26mm/px · 3 of 36 slices shown]
[im 9/36  bone]
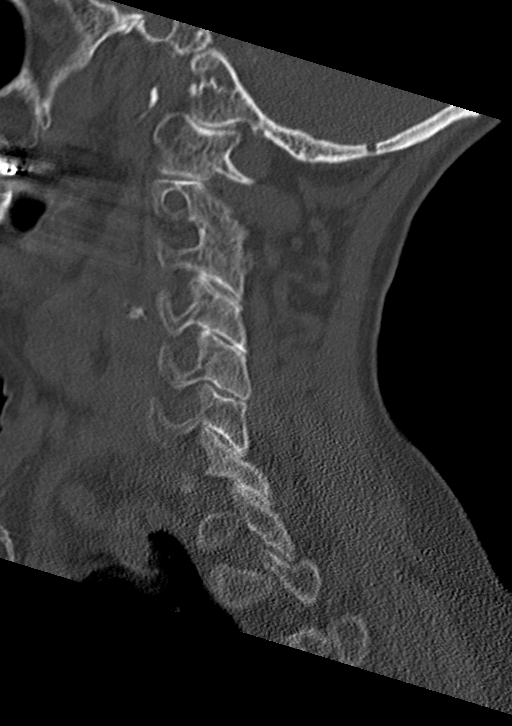
[im 18/36  bone]
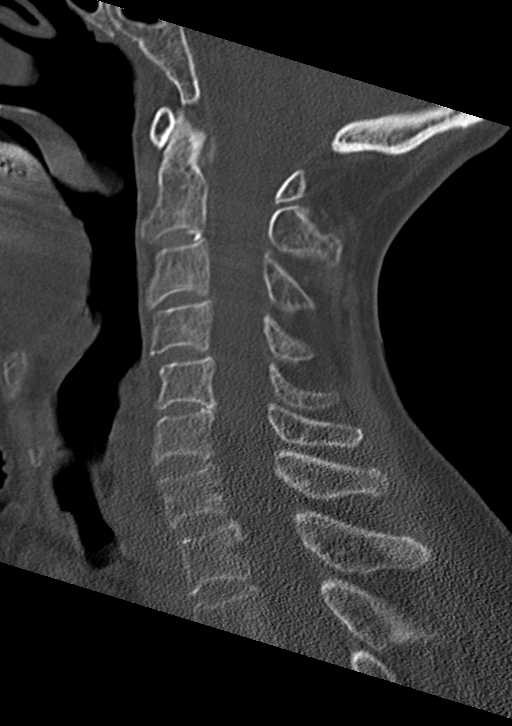
[im 27/36  bone]
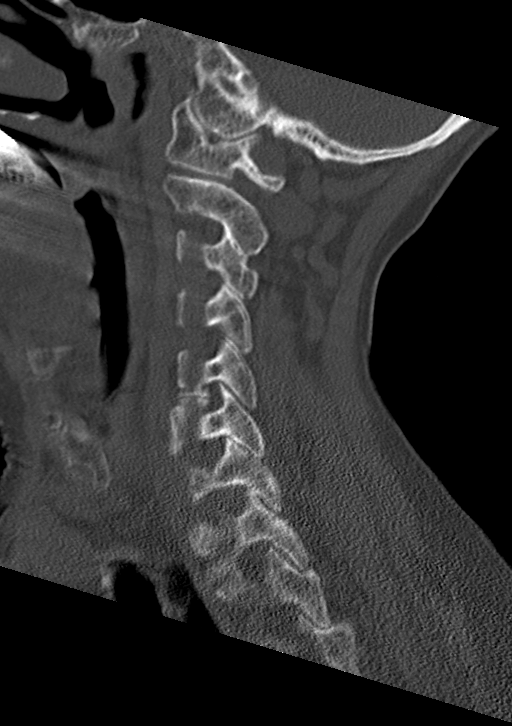

[Series 9: coronal bone · coronal · 0.23mm/px · 1 of 57 slices shown]
[im 29/57  bone]
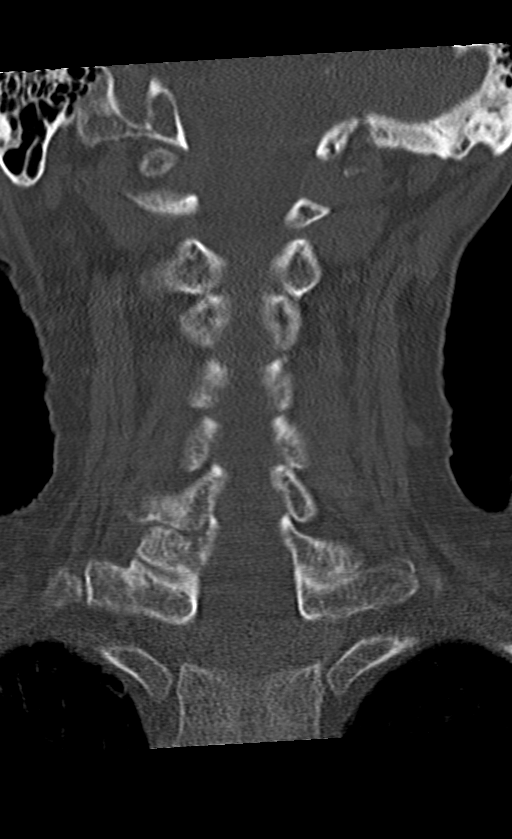

[Series 10: orthogonal bone · axial · 0.21mm/px · z∈[-317,-189]mm · 5 of 101 slices shown, 7 images]
[im 17/101  soft-tissue]
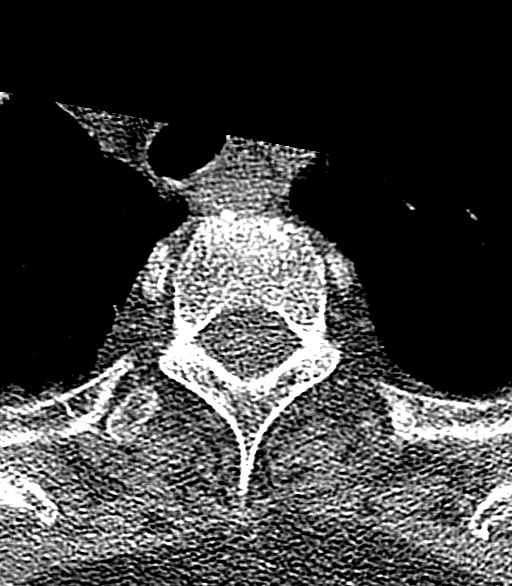
[im 17/101  bone]
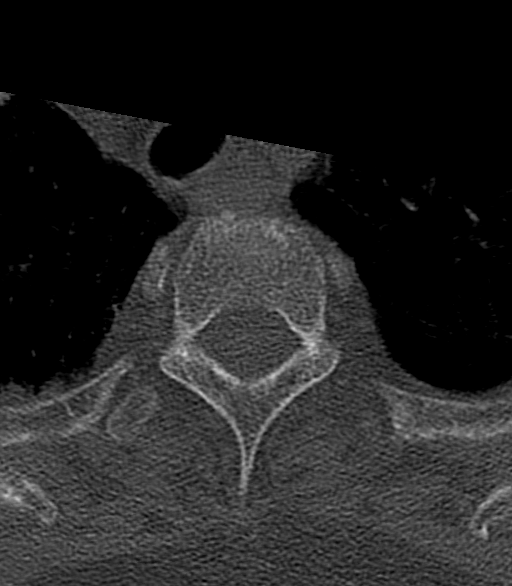
[im 34/101  bone]
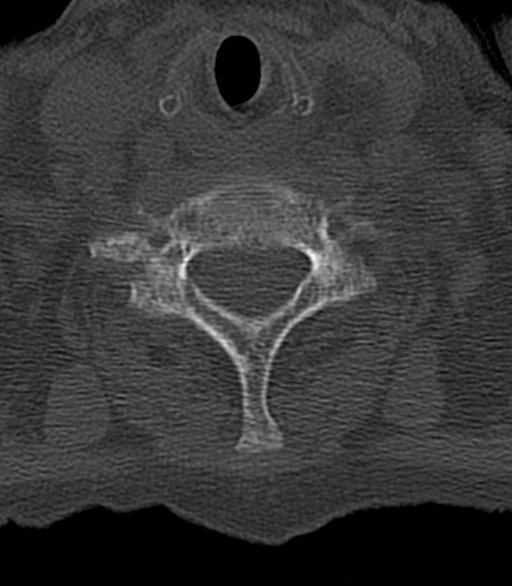
[im 51/101  bone]
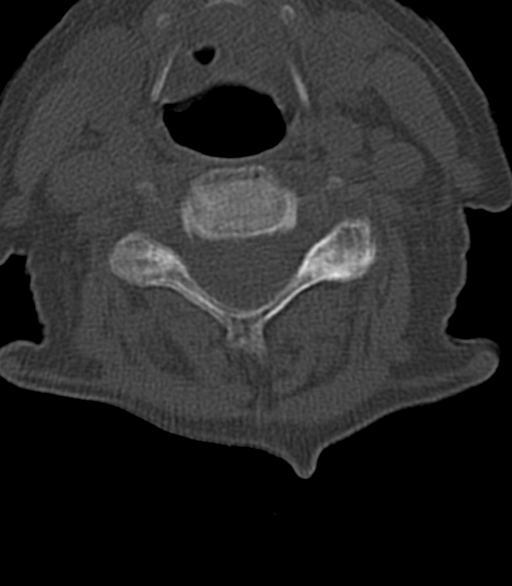
[im 67/101  bone]
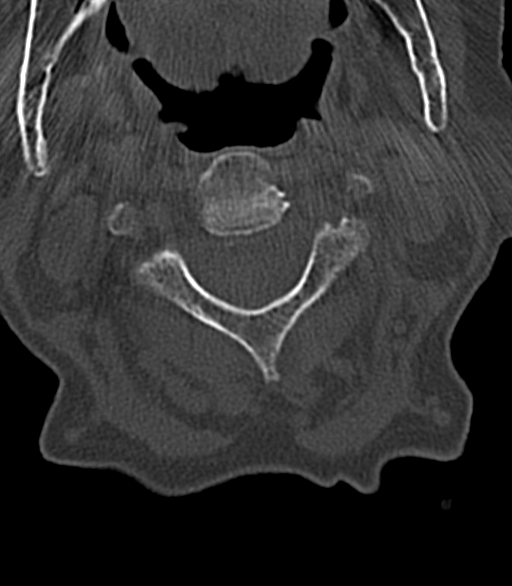
[im 84/101  soft-tissue]
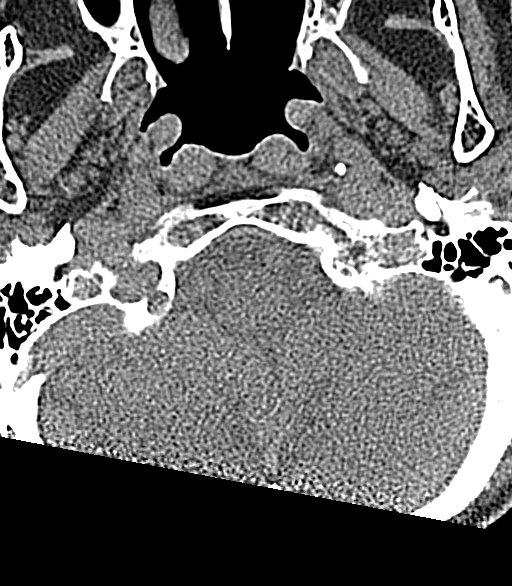
[im 84/101  bone]
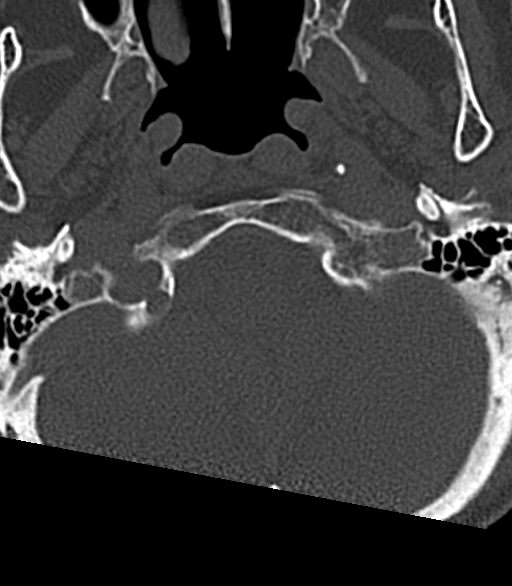

[12 of 33 positions shown; findings below may reference images not displayed]

FINDINGS: CT HEAD FINDINGS

Brain: Generalized atrophy. Chronic small-vessel ischemic changes of
the hemispheric white matter. Chronic low-density subdural hygromas.
No sign of acute infarction, mass lesion, hemorrhage, hydrocephalus
or subdural hematoma.

Vascular: There is atherosclerotic calcification of the major
vessels at the base of the brain.

Skull: Negative

Sinuses/Orbits: Clear/normal

Other: Posterior scalp hematoma.

CT CERVICAL SPINE FINDINGS

Alignment: No traumatic malalignment. 2 mm degenerative
anterolisthesis C7-T1.

Skull base and vertebrae: Normal

Soft tissues and spinal canal: Normal

Disc levels: Mild spondylosis at C5-6 with small osteophytes and
mild foraminal narrowing on the right. Facet osteoarthritis at C7-T1
with 2 mm of anterolisthesis but no stenosis.

Upper chest: Negative

Other: None
IMPRESSION: Head CT: No acute intracranial finding. Atrophy, chronic small
vessel ischemic changes and subdural hygromas. Posterior scalp
hematoma.

Cervical spine CT: No acute or traumatic finding. Mild degenerative
changes.
